# Patient Record
Sex: Female | Born: 1975
Health system: Southern US, Community
[De-identification: ages and names within clinical notes are randomized; demographics above are authoritative.]

## PROBLEM LIST (undated history)

## (undated) DIAGNOSIS — F419 Anxiety disorder, unspecified: Secondary | ICD-10-CM

## (undated) DIAGNOSIS — M545 Low back pain, unspecified: Secondary | ICD-10-CM

## (undated) DIAGNOSIS — G43909 Migraine, unspecified, not intractable, without status migrainosus: Secondary | ICD-10-CM

## (undated) DIAGNOSIS — O039 Complete or unspecified spontaneous abortion without complication: Secondary | ICD-10-CM

## (undated) DIAGNOSIS — R5383 Other fatigue: Secondary | ICD-10-CM

## (undated) HISTORY — DX: Low back pain: M54.5

## (undated) HISTORY — DX: Other fatigue: R53.83

## (undated) HISTORY — DX: Anxiety disorder, unspecified: F41.9

## (undated) HISTORY — DX: Migraine, unspecified, not intractable, without status migrainosus: G43.909

## (undated) HISTORY — DX: Low back pain, unspecified: M54.50

## (undated) HISTORY — DX: Complete or unspecified spontaneous abortion without complication: O03.9

---

## 2001-01-05 HISTORY — PX: BREAST CYST EXCISION: SHX579

## 2002-01-05 HISTORY — PX: COLONOSCOPY: SHX174

## 2009-04-17 ENCOUNTER — Ambulatory Visit: Payer: Self-pay

## 2011-07-15 ENCOUNTER — Ambulatory Visit: Payer: Self-pay

## 2012-07-15 ENCOUNTER — Ambulatory Visit: Payer: Self-pay

## 2012-11-02 ENCOUNTER — Ambulatory Visit: Payer: Self-pay

## 2013-03-15 DIAGNOSIS — R519 Headache, unspecified: Secondary | ICD-10-CM | POA: Insufficient documentation

## 2013-03-15 DIAGNOSIS — R51 Headache: Secondary | ICD-10-CM

## 2014-01-19 ENCOUNTER — Ambulatory Visit: Payer: Self-pay

## 2014-01-30 ENCOUNTER — Ambulatory Visit: Payer: Self-pay | Admitting: Family Medicine

## 2014-04-20 ENCOUNTER — Other Ambulatory Visit: Payer: Self-pay | Admitting: Certified Nurse Midwife

## 2014-04-20 DIAGNOSIS — N6459 Other signs and symptoms in breast: Secondary | ICD-10-CM

## 2014-07-23 ENCOUNTER — Other Ambulatory Visit: Payer: Self-pay

## 2014-07-23 ENCOUNTER — Ambulatory Visit: Payer: Self-pay | Attending: Certified Nurse Midwife

## 2014-08-01 ENCOUNTER — Ambulatory Visit
Admission: RE | Admit: 2014-08-01 | Discharge: 2014-08-01 | Disposition: A | Discharge status: Home / Self Care | Payer: 59 | Source: Ambulatory Visit | Attending: Family Medicine | Admitting: Family Medicine

## 2014-08-01 ENCOUNTER — Other Ambulatory Visit: Payer: Self-pay | Admitting: Family Medicine

## 2014-08-01 DIAGNOSIS — M79661 Pain in right lower leg: Secondary | ICD-10-CM | POA: Insufficient documentation

## 2014-10-05 ENCOUNTER — Ambulatory Visit
Admission: RE | Admit: 2014-10-05 | Discharge: 2014-10-05 | Disposition: A | Discharge status: Home / Self Care | Payer: 59 | Source: Ambulatory Visit | Attending: Certified Nurse Midwife | Admitting: Certified Nurse Midwife

## 2014-10-05 DIAGNOSIS — N6459 Other signs and symptoms in breast: Secondary | ICD-10-CM

## 2014-10-05 DIAGNOSIS — R921 Mammographic calcification found on diagnostic imaging of breast: Secondary | ICD-10-CM | POA: Diagnosis not present

## 2015-01-21 ENCOUNTER — Other Ambulatory Visit: Payer: Self-pay | Admitting: Obstetrics and Gynecology

## 2015-01-21 DIAGNOSIS — Z01419 Encounter for gynecological examination (general) (routine) without abnormal findings: Secondary | ICD-10-CM | POA: Diagnosis not present

## 2015-01-21 DIAGNOSIS — R921 Mammographic calcification found on diagnostic imaging of breast: Secondary | ICD-10-CM | POA: Diagnosis not present

## 2015-01-21 DIAGNOSIS — N898 Other specified noninflammatory disorders of vagina: Secondary | ICD-10-CM | POA: Diagnosis not present

## 2015-01-21 DIAGNOSIS — G43109 Migraine with aura, not intractable, without status migrainosus: Secondary | ICD-10-CM | POA: Diagnosis not present

## 2015-01-21 DIAGNOSIS — Z124 Encounter for screening for malignant neoplasm of cervix: Secondary | ICD-10-CM | POA: Diagnosis not present

## 2015-01-21 DIAGNOSIS — Z309 Encounter for contraceptive management, unspecified: Secondary | ICD-10-CM | POA: Diagnosis not present

## 2015-03-28 ENCOUNTER — Other Ambulatory Visit: Payer: 59

## 2015-03-28 ENCOUNTER — Ambulatory Visit: Payer: 59 | Attending: Obstetrics and Gynecology

## 2015-04-11 DIAGNOSIS — G43719 Chronic migraine without aura, intractable, without status migrainosus: Secondary | ICD-10-CM | POA: Diagnosis not present

## 2015-05-22 ENCOUNTER — Ambulatory Visit
Admission: RE | Admit: 2015-05-22 | Discharge: 2015-05-22 | Disposition: A | Discharge status: Home / Self Care | Payer: 59 | Source: Ambulatory Visit | Attending: Obstetrics and Gynecology | Admitting: Obstetrics and Gynecology

## 2015-05-22 DIAGNOSIS — R921 Mammographic calcification found on diagnostic imaging of breast: Secondary | ICD-10-CM

## 2015-06-26 DIAGNOSIS — N644 Mastodynia: Secondary | ICD-10-CM | POA: Diagnosis not present

## 2015-06-26 DIAGNOSIS — R920 Mammographic microcalcification found on diagnostic imaging of breast: Secondary | ICD-10-CM | POA: Diagnosis not present

## 2015-08-02 DIAGNOSIS — G43719 Chronic migraine without aura, intractable, without status migrainosus: Secondary | ICD-10-CM | POA: Diagnosis not present

## 2016-03-05 ENCOUNTER — Other Ambulatory Visit: Payer: Self-pay | Admitting: Obstetrics and Gynecology

## 2016-03-05 DIAGNOSIS — Z1231 Encounter for screening mammogram for malignant neoplasm of breast: Secondary | ICD-10-CM | POA: Diagnosis not present

## 2016-03-05 DIAGNOSIS — R921 Mammographic calcification found on diagnostic imaging of breast: Secondary | ICD-10-CM

## 2016-03-05 DIAGNOSIS — Z800 Family history of malignant neoplasm of digestive organs: Secondary | ICD-10-CM | POA: Diagnosis not present

## 2016-03-05 DIAGNOSIS — Z01419 Encounter for gynecological examination (general) (routine) without abnormal findings: Secondary | ICD-10-CM | POA: Diagnosis not present

## 2016-03-17 DIAGNOSIS — N888 Other specified noninflammatory disorders of cervix uteri: Secondary | ICD-10-CM | POA: Diagnosis not present

## 2016-03-17 DIAGNOSIS — N841 Polyp of cervix uteri: Secondary | ICD-10-CM | POA: Diagnosis not present

## 2016-03-17 DIAGNOSIS — N72 Inflammatory disease of cervix uteri: Secondary | ICD-10-CM | POA: Diagnosis not present

## 2016-04-14 DIAGNOSIS — N888 Other specified noninflammatory disorders of cervix uteri: Secondary | ICD-10-CM | POA: Diagnosis not present

## 2016-04-14 DIAGNOSIS — N72 Inflammatory disease of cervix uteri: Secondary | ICD-10-CM | POA: Diagnosis not present

## 2016-05-25 ENCOUNTER — Ambulatory Visit
Admission: RE | Admit: 2016-05-25 | Discharge: 2016-05-25 | Disposition: A | Discharge status: Home / Self Care | Payer: 59 | Source: Ambulatory Visit | Attending: Obstetrics and Gynecology | Admitting: Obstetrics and Gynecology

## 2016-05-25 DIAGNOSIS — R921 Mammographic calcification found on diagnostic imaging of breast: Secondary | ICD-10-CM | POA: Insufficient documentation

## 2016-05-25 DIAGNOSIS — R922 Inconclusive mammogram: Secondary | ICD-10-CM | POA: Diagnosis not present

## 2016-07-01 ENCOUNTER — Ambulatory Visit: Payer: Self-pay | Admitting: Physician Assistant

## 2016-07-01 VITALS — BP 100/80 | HR 82 | Temp 98.6°F

## 2016-07-01 DIAGNOSIS — M79672 Pain in left foot: Secondary | ICD-10-CM

## 2016-07-01 MED ORDER — GABAPENTIN 300 MG PO CAPS: 300.0000 mg | ORAL_CAPSULE | Freq: Every day | ORAL | 3 refills | Status: DC

## 2016-07-01 MED ORDER — METHYLPREDNISOLONE 4 MG PO TBPK
ORAL_TABLET | ORAL | 0 refills | Status: DC
Start: 2016-07-01 — End: 2016-08-24

## 2016-07-01 NOTE — Progress Notes (Signed)
S: c/o pain b/n 3rd and 4th toes, hurts more on the bottom of her foot, pain will radiate up her leg to her knee, no known injury, pain for about a month, no redness or swelling   O: vitals wnl, nad, skin intact, no bruising or redness noted on foot, are under 3rd and 4th toe is a little tender, area at back of left knee a little tender, full rom of both, n/v intact  A: acute foot pain, ?morton's neuroma  P: medrol dose pack, gabapentin, refer to podiatry

## 2016-07-01 NOTE — Progress Notes (Signed)
Spoke with Deanna @ [facility] next available appointment due to vacations will be on July 19,2018 with Dr. Jens Som @ 9:45. LM on patient voicemail of appt. day and time. Also stated that she can call their office prior to her appointment to see if there is any cancellations.

## 2016-08-24 ENCOUNTER — Ambulatory Visit (INDEPENDENT_AMBULATORY_CARE_PROVIDER_SITE_OTHER): Payer: 59

## 2016-08-24 ENCOUNTER — Ambulatory Visit: Payer: Self-pay

## 2016-08-24 ENCOUNTER — Ambulatory Visit (INDEPENDENT_AMBULATORY_CARE_PROVIDER_SITE_OTHER): Payer: 59 | Admitting: Podiatry

## 2016-08-24 DIAGNOSIS — G5762 Lesion of plantar nerve, left lower limb: Secondary | ICD-10-CM | POA: Diagnosis not present

## 2016-08-24 DIAGNOSIS — M79672 Pain in left foot: Secondary | ICD-10-CM

## 2016-08-24 DIAGNOSIS — D3613 Benign neoplasm of peripheral nerves and autonomic nervous system of lower limb, including hip: Secondary | ICD-10-CM

## 2016-08-24 DIAGNOSIS — F419 Anxiety disorder, unspecified: Secondary | ICD-10-CM | POA: Insufficient documentation

## 2016-08-24 DIAGNOSIS — M205X9 Other deformities of toe(s) (acquired), unspecified foot: Secondary | ICD-10-CM

## 2016-08-24 DIAGNOSIS — N6001 Solitary cyst of right breast: Secondary | ICD-10-CM | POA: Insufficient documentation

## 2016-08-24 MED ORDER — MELOXICAM 15 MG PO TABS: 15.0000 mg | ORAL_TABLET | Freq: Every day | ORAL | 3 refills | Status: DC

## 2016-08-24 NOTE — Progress Notes (Addendum)
   Subjective:    Patient ID: Monica Kaiser, female    DOB: 03-Jun-1975, 41 y.o.   MRN: [id number]  HPI this patient presents the office with chief complaint of sharp, radiating pain noted through the fourth and third toes of her left foot.  She says she experiences radiating pain noted through her foot up the back of her leg.  She says that she walks continuously at work, which includes 12 hour shift.  She says she walks and after 1 hour she started experiencing pain and discomfort in her left foot extending up to her left leg  . She says that this pain is becoming  more frequent and the pain has greater intensity.  She says she has attempted to use ibuprofen, which has provided no relief.  She points to her third and fourth toes as the painful areas and states that she feels pain on the bottom of these 2 toes.  She denies any history of trauma or reinjury to the foot.  She presents the office today for an evaluation and treatment of her painful left foot    Review of Systems     Objective:   Physical Exam GENERAL APPEARANCE: Alert, conversant. Appropriately groomed. No acute distress.  VASCULAR: Pedal pulses are  palpable at  [facility] and PT bilateral.  Capillary refill time is immediate to all digits,  Normal temperature gradient.  Digital hair growth is present bilateral  NEUROLOGIC: sensation is normal to 5.07 monofilament at 5/5 sites bilateral.  Light touch is intact bilateral, Muscle strength normal.  MUSCULOSKELETAL: acceptable muscle strength, tone and stability bilateral.  Functional hallux limitus first MPJ bilateral.  Dorsal exostosis noted at the head of the first metatarsal bilaterally.  Palpable pain noted in the third interspace of the left foot.  Absent Mulder sign.  Exostosis noted at the level of the first metatarsal cuneiform joint bilaterally.  This patient has significant hypermobility in her feet, especially her forefoot bilaterally.  DERMATOLOGIC: skin color, texture, and turgor  are within normal limits.  No preulcerative lesions or ulcers  are seen, no interdigital maceration noted.  No open lesions present.  Digital nails are asymptomatic. No drainage noted.         Assessment & Plan:  Functional hallux limitus first MPJ bilaterally.  Neuroma third interspace left foot .  Hypermobility bilaterally.  Initial exam.  X-rays were taken which reveal no evidence of any bony pathology.  There is dorsal lipping noted at the head of the first metatarsal left foot.  Discussed this condition with this patient.  Explained to her that her hypermobility has led to her  functional hallux limitus.  Piling on metatarsals, left foot, which leads to a neuroma of the third interspace.  Told her that  she should wear her orthotics to limit her hypermobility.  She says she will purchase orthotics on her own.  Prescribed Mobic to be taken when necessary for pain.  Return to the clinic in 2 weeks at which time she could be measured for orthoses with a kinetic wedge.   Gardiner Barefoot DPM

## 2016-08-27 ENCOUNTER — Telehealth: Payer: Self-pay | Admitting: Podiatry

## 2016-08-27 NOTE — Telephone Encounter (Signed)
Yes that will be fine.  She might be better off with orthotics. Make sure to put the charge in.

## 2016-08-27 NOTE — Telephone Encounter (Signed)
Patient came in and picked up a cam boot to try.

## 2016-08-27 NOTE — Telephone Encounter (Signed)
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 She wanted to know if Dr. Milinda Pointer could give her a cam boot to wear for support. Please advise.

## 2016-09-17 ENCOUNTER — Ambulatory Visit (INDEPENDENT_AMBULATORY_CARE_PROVIDER_SITE_OTHER): Payer: 59 | Admitting: Podiatry

## 2016-09-17 ENCOUNTER — Encounter: Payer: Self-pay | Admitting: Podiatry

## 2016-09-17 DIAGNOSIS — M79672 Pain in left foot: Secondary | ICD-10-CM

## 2016-09-17 DIAGNOSIS — M205X9 Other deformities of toe(s) (acquired), unspecified foot: Secondary | ICD-10-CM | POA: Diagnosis not present

## 2016-09-17 DIAGNOSIS — D3613 Benign neoplasm of peripheral nerves and autonomic nervous system of lower limb, including hip: Secondary | ICD-10-CM | POA: Diagnosis not present

## 2016-09-17 NOTE — Progress Notes (Signed)
This patient returns to the office stating that she still having pain and discomfort in her left foot  . She was seen approximately 3 weeks ago and diagnosed with a functional hallux limitus first MPJ and neuroma third interspace left foot.  She now says the pain in her left midfoot is worse and seems to also be in her other area of her left foot. She has worn her power step insoles as well as prescribed Mobic to take for pain. She says that the pain worsens by the end of the day. Since she works 12 hour shifts.  She says her pain is not severe on her off days but is severe after days working the 12 hour shift.  She says the pain at that time measures 8-9 on a scale of 10.  She has also worn a Cam Walker in an effort to control her pain when she works.  She presents the office stating that the pain continues and she states the insoles have not been effective in her mind.  She presents the office today for continued evaluation and treatment of her painful left foot   Podiatric Exam: Vascular: dorsalis pedis and posterior tibial pulses are palpable bilateral. Capillary return is immediate. Temperature gradient is WNL. Skin turgor WNL  Sensorium: Normal Semmes Weinstein monofilament test. Normal tactile sensation bilaterally. Nail Exam: Pt has thick disfigured discolored nails with subungual debris noted bilateral entire nail hallux through fifth toenails Ulcer Exam: There is no evidence of ulcer or pre-ulcerative changes or infection. Orthopedic Exam: Muscle tone and strength are WNL. No limitations in general ROM. No crepitus or effusions noted. Functional hallux limitus noticed first MPJ bilaterally. Dorsal exostosis noted at the head of the first metatarsal bilaterally..  Palpable pain noted in the second and third interspaces of the left foot.  Exostosis noted at the first [facility] J bilaterally.  Significant hypermobility noted Skin: No Porokeratosis. No infection or ulcer.  Functional hallux limitus first  MPJ bilateral  neuroma, second, third interspace left foot  . Forefoot hypermobility bilateral  Return office visit.  Injection therapy of the left forefoot.  . Told her to continue to use the power step insoles.  Patient was told to take the Mobic when necessary.  RTC 3 weeks.  I still need to consider orthotic therapy and obtain a kinetic wedge for this patient in the future    Gardiner Barefoot DPM

## 2016-09-23 ENCOUNTER — Encounter: Payer: Self-pay | Admitting: Physician Assistant

## 2016-09-23 ENCOUNTER — Ambulatory Visit: Payer: Self-pay | Admitting: Physician Assistant

## 2016-09-23 VITALS — BP 100/60 | HR 71 | Temp 98.4°F

## 2016-09-23 DIAGNOSIS — J029 Acute pharyngitis, unspecified: Secondary | ICD-10-CM

## 2016-09-23 LAB — POCT RAPID STREP A (OFFICE): Rapid Strep A Screen: NEGATIVE

## 2016-09-23 MED ORDER — BENZONATATE 200 MG PO CAPS: 200.0000 mg | ORAL_CAPSULE | Freq: Two times a day (BID) | ORAL | 0 refills | Status: DC | PRN

## 2016-09-23 MED ORDER — AZITHROMYCIN 250 MG PO TABS: ORAL_TABLET | ORAL | 0 refills | Status: DC

## 2016-09-23 NOTE — Progress Notes (Signed)
S: C/o sore throat, cough, and  congestion for 2 days, no fever, chills, cp/sob, v/d; mucus was green this am but clear throughout the day, cough is sporadic, coworker had ?strep  Using otc meds:   O: PE: vitals wnl, nad, perrl eomi, normocephalic, tms dull, nasal mucosa red and swollen, throat injected, neck supple no lymph, lungs c t a, cv rrr, neuro intact, q strep neg  A:  Acute uri   P: drink fluids, continue regular meds , use otc meds of choice, return if not improving in 5 days, return earlier if worsening  Zpack, tessalon perls

## 2016-11-05 ENCOUNTER — Ambulatory Visit: Payer: 59 | Admitting: Podiatry

## 2016-11-05 ENCOUNTER — Encounter: Payer: Self-pay | Admitting: Podiatry

## 2016-11-05 ENCOUNTER — Ambulatory Visit (INDEPENDENT_AMBULATORY_CARE_PROVIDER_SITE_OTHER): Payer: 59 | Admitting: Podiatry

## 2016-11-05 VITALS — BP 96/63 | HR 67

## 2016-11-05 DIAGNOSIS — D3613 Benign neoplasm of peripheral nerves and autonomic nervous system of lower limb, including hip: Secondary | ICD-10-CM | POA: Diagnosis not present

## 2016-11-05 DIAGNOSIS — M205X9 Other deformities of toe(s) (acquired), unspecified foot: Secondary | ICD-10-CM | POA: Diagnosis not present

## 2016-11-05 NOTE — Progress Notes (Signed)
This patient presents the office with continued pain noted in her left foot.  She says that the pain was severe yesterday and was radiating from her toes up into her leg into her toe. 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 She says the pain is severe at work that sometimes she wears the cam walker that was previously dispensed.  She was treated with injection therapy for the neuromas as well as Mobic and she says they have been minimally effective.  She has been previously diagnosed with a functional hallux limitus with neuromas in her left forefoot.  She returns the office today for continued evaluation and treatment of this painful left foot.     General Appearance  Alert, conversant and in no acute stress.  Vascular  Dorsalis pedis and posterior pulses are palpable  bilaterally.  Capillary return is within normal limits  Bilaterally. Temperature is within normal limits  Bilaterally  Neurologic  Senn-Weinstein monofilament wire test within normal limits  bilaterally. Muscle power  Within normal limits bilaterally.  Nails normal nails noted with no evidence of any bacterial or fungal infection  Orthopedic  No limitations of motion of motion feet bilaterally.  No crepitus or effusions noted.  Functional hallux limitus noticed first MPJ bilateral dorsal exostosis at the head of the first metatarsal bilaterally.  Localized pain in the third interspace of the left foot.  Excessive forefoot hypermobility.  Skin  normotropic skin with no porokeratosis noted bilaterally.  No signs of infections or ulcers noted.     Functional hallux limitus first MPJ bilaterally.  Neuroma third interspace left foot.     ROV.  Talked to this patient concerning her foot.  We have tried a Cam Walker, injection therapy as well as anti-inflammatory medicine and she continues to have pain.  She has been against obtaining orthotics, but I told her that I believe this would help her and she decided to make an  appointment with Liliane Channel  to be casted and measured for orthotics with a kinetic wedge.  She is return to the office on Wednesday for that appointment with Liliane Channel.  In the meantime, continue to wear the power step insoles and take the Mobic as needed.   Gardiner Barefoot DPM

## 2016-11-11 ENCOUNTER — Ambulatory Visit (INDEPENDENT_AMBULATORY_CARE_PROVIDER_SITE_OTHER): Payer: 59 | Admitting: Orthotics

## 2016-11-11 DIAGNOSIS — M205X9 Other deformities of toe(s) (acquired), unspecified foot: Secondary | ICD-10-CM | POA: Diagnosis not present

## 2016-11-11 DIAGNOSIS — D3613 Benign neoplasm of peripheral nerves and autonomic nervous system of lower limb, including hip: Secondary | ICD-10-CM

## 2016-11-11 NOTE — Progress Notes (Signed)
Patient came in today for evaluation/assessment custom foot orthotics.  Patient presents foot pain and discomfort associated with mortons neuroma LEFT  . Marland KitchenMarland KitchenMarland KitchenGoal is to "bring ground up" with contoured arch support, and neurma pad proximal 3/4 interspace L.

## 2016-12-08 ENCOUNTER — Ambulatory Visit: Payer: 59 | Admitting: Orthotics

## 2016-12-08 DIAGNOSIS — M205X9 Other deformities of toe(s) (acquired), unspecified foot: Secondary | ICD-10-CM

## 2016-12-08 NOTE — Progress Notes (Signed)
Patient came in today to pick up custom made foot orthotics.  The goals were accomplished and the patient reported no dissatisfaction with said orthotics.  Patient was advised of breakin period and how to report any issues. 

## 2016-12-30 ENCOUNTER — Ambulatory Visit: Payer: 59 | Admitting: Orthotics

## 2016-12-30 DIAGNOSIS — D3613 Benign neoplasm of peripheral nerves and autonomic nervous system of lower limb, including hip: Secondary | ICD-10-CM

## 2016-12-30 NOTE — Progress Notes (Signed)
Patient came in today for adjusment CMFO.  She complaint is that she tried the neuroma pad in various places and still didn't get the relief she was expecting.  I will modifiy fhe f/o to add met bar left and offload 3rd MPJ.

## 2016-12-31 ENCOUNTER — Ambulatory Visit: Payer: 59 | Attending: Family Medicine | Admitting: Physical Therapy

## 2016-12-31 DIAGNOSIS — M79672 Pain in left foot: Secondary | ICD-10-CM | POA: Insufficient documentation

## 2016-12-31 NOTE — Therapy (Signed)
[place] [facility] [address] [place], [place], [address] Phone: [phone]   Fax:  [phone]  Patient Details  Name: Monica Kaiser MRN: [id number] Date of Birth: 16-Jun-1975 Referring Provider:  [place] Pink, MD, PCP Prudence Davidson, MD- Podiatrist Encounter Date: 12/31/2016  PT/OT/SLP Screening Form   Time: in_903_____     Time out__928___   Complaint ___left foot pain________________ Past Medical Hx:  __none significant______________ Injury Date:__a few months______________________________  Pain Scale: __Current: 4/10, Worst: 8/10, best: 2/10__________ Patient's phone number: [phone]  Hx (this occurrence):  Went to see MD regarding left foot pain; Patient reports that she was diagnosed with nerve pain along 3rd/4th metatarsal which radiates to back of leg; Patient was diagnosed with morton's neuroma; She was given custom orthotics. She was also given a CAM boot; She reports that after about an hour she will be limping and having trouble with weight bearing; she reports that her orthotics are painful and when she went to MD yesterday they kept her orthotics and were going to pad it more.   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; She reports that rest will help relieve pain; She reports trying ice/heat in the past with no significant relief;     Assessment: Ankle ROM: LLE: DF: 5 degrees, PF 80 degrees, IV: 55 degrees, EV: 40 degrees with no significant pain in either direction  RLE: DF: 8 degrees, PF 80 degrees, IV: 55 degrees, EV: 30 degrees with no pain;   Patient exhibits increased tenderness and pain along 3rd and 4th toe plantar surface superior to metatarsal head; Has some pain with AP/PA mobs along metatarsals;   Recommendations:   Pt would benefit from skilled PT intervention to improve strength in left foot/ankle as she exhibits significant laxity and weakness; Would  also benefit from trial of Iontophoresis for pain management related to inflammation;  Comments:    [x]  Patient would benefit from an MD referral []  Patient would benefit from a full PT/OT/ SLP evaluation and treatment. []  No intervention recommended at this time.   Pari Lombard  PT, DPT 12/31/2016, 9:06 AM  [place] [facility] [address] [place], [place], [address] Phone: [phone]   Fax:  [phone]

## 2016-12-31 NOTE — Patient Instructions (Addendum)
Dorsiflexion: Resisted    Facing anchor, tubing around left foot, pull toward face.  Repeat ___15_ times per set. Do _2___ sets per session. Do _2___ sessions per day.  http://orth.exer.us/9   Copyright  VHI. All rights reserved.  Eversion: Resisted    With left foot in tubing loop, hold tubing around other foot to resist and turn foot out. Repeat _15___ times per set. Do ___2_ sets per session. Do _2___ sessions per day.  http://orth.exer.us/15   Copyright  VHI. All rights reserved.

## 2017-01-06 ENCOUNTER — Ambulatory Visit: Payer: 59 | Admitting: Orthotics

## 2017-01-06 DIAGNOSIS — M205X9 Other deformities of toe(s) (acquired), unspecified foot: Secondary | ICD-10-CM

## 2017-01-06 DIAGNOSIS — D3613 Benign neoplasm of peripheral nerves and autonomic nervous system of lower limb, including hip: Secondary | ICD-10-CM

## 2017-01-06 DIAGNOSIS — N898 Other specified noninflammatory disorders of vagina: Secondary | ICD-10-CM | POA: Diagnosis not present

## 2017-01-06 DIAGNOSIS — N760 Acute vaginitis: Secondary | ICD-10-CM | POA: Diagnosis not present

## 2017-01-06 DIAGNOSIS — B9689 Other specified bacterial agents as the cause of diseases classified elsewhere: Secondary | ICD-10-CM | POA: Diagnosis not present

## 2017-01-06 NOTE — Progress Notes (Signed)
Gave patient met bar and small met pad to try to fix in f/o.Marland Kitchenshe will advise how they feel and make f/u appointment to glue top cover down.

## 2017-01-13 ENCOUNTER — Encounter: Payer: Self-pay | Admitting: Physical Therapy

## 2017-01-13 ENCOUNTER — Ambulatory Visit: Payer: 59 | Attending: Family Medicine | Admitting: Physical Therapy

## 2017-01-13 DIAGNOSIS — M79672 Pain in left foot: Secondary | ICD-10-CM | POA: Diagnosis not present

## 2017-01-13 DIAGNOSIS — R262 Difficulty in walking, not elsewhere classified: Secondary | ICD-10-CM | POA: Insufficient documentation

## 2017-01-13 DIAGNOSIS — M6281 Muscle weakness (generalized): Secondary | ICD-10-CM | POA: Diagnosis not present

## 2017-01-13 NOTE — Patient Instructions (Addendum)
Calf Stretch    With towel around forefoot, keep knee straight and pull back on towel until a stretch is felt in the calf. While feeling stretch, pull toes back and then relax to facilitate nerve stretch;  Hold _1___ seconds. Repeat _10___ times. Do __4__ sessions per day.  Copyright  VHI. All rights reserved.   Then with towel under calf, around bottom of foot, pull foot towards you while also pulling toes until stretch is felt in calf. HOld for 20 sec; Repeat 3-4 times  A day;    Ice bottom of foot for 10 min at a time at least 3-4 times a day- be sure to wear a sock to avoid ice directly on skin;

## 2017-01-13 NOTE — Therapy (Signed)
[place] MAIN [facility] SERVICES [address] [place], [place], [address] Phone: [phone]   Fax:  [phone]  Physical Therapy Evaluation  Patient Details  Name: Monica Kaiser MRN: [id number] Date of Birth: [birth year] Referring Provider: Kary Kos, MD (PCP)   Encounter Date: 01/13/2017  PT End of Session - 01/13/17 1522    Visit Number  1    Number of Visits  13    Date for PT Re-Evaluation  02/24/17    Authorization - Visit Number  1    Authorization - Number of Visits  60    PT Start Time  1100    PT Stop Time  7893    PT Time Calculation (min)  55 min    Activity Tolerance  Patient tolerated treatment well;No increased pain    Behavior During Therapy  [facility] for tasks assessed/performed       History reviewed. No pertinent past medical history.  Past Surgical History:  Procedure Laterality Date  . BREAST BIOPSY Right 2003   removed cyst    There were no vitals filed for this visit.   Subjective Assessment - 01/13/17 1103    Subjective  "My left foot hurts really bad. especially after I have been standing for > 1 hour"     Pertinent History  Patient reports increased left foot pain for a few months; Went to see MD regarding left foot pain; Patient reports that she was diagnosed with nerve pain along 3rd/4th metatarsal which radiates to back of leg; Patient was diagnosed with morton's neuroma; She was given custom orthotics. She was also given a CAM boot; She reports that after about an hour she will be limping and having trouble with weight bearing; she reports that her orthotics are painful and when she went to MD yesterday they kept her orthotics and were going to pad it more. She got the pad back but states that they are worse; She reports that they are uncomfortable due to cusions under met head. "It feels like I have a rolled up sock" Patient also reports increased numbness in toes/feet with prolonged sitting (riding in the car); she  will notice it other times when at rest; She reports that the numbness goes away when she starts walking on it. patient reports that she will walk at home without shoes on.     Limitations  Standing;Walking    How long can you sit comfortably?  does relieve pain some; but will have some numbness with prolonged sitting ;    How long can you stand comfortably?  >30 min    How long can you walk comfortably?  pain with 0.25 miles    Diagnostic tests  Xrays in August 2018- diagnosed with neuroma in left foot; and some joint issues along 1st toe;     Patient Stated Goals  reduce pain;     Currently in Pain?  Yes    Pain Score  5     Pain Location  Foot    Pain Orientation  Left    Pain Descriptors / Indicators  Aching;Throbbing    Pain Type  Chronic pain    Pain Radiating Towards  radiating up to calf;     Pain Onset  More than a month ago    Pain Frequency  Constant    Aggravating Factors   worse with standing/walking;     Pain Relieving Factors  rest, taking shoes off;     Effect of Pain  on Daily Activities  decreased work tolerance- has to stand most of the day at work;     Multiple Pain Sites  No         [facility] PT Assessment - 01/13/17 0001      Assessment   Medical Diagnosis  left foot pain    Referring Provider  Kary Kos, MD (PCP)    Onset Date/Surgical Date  08/05/16    Hand Dominance  Right    Next MD Visit  none scheduled    Prior Therapy  denies any therapy for this condition;       Precautions   Precautions  None    Required Braces or Orthoses  -- has orthotics for left foot and CAM boot PRN      Restrictions   Weight Bearing Restrictions  No      Balance Screen   Has the patient fallen in the past 6 months  No    Has the patient had a decrease in activity level because of a fear of falling?   No    Is the patient reluctant to leave their home because of a fear of falling?   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, sat/sun; will have increased pain by Mon due to increased work demands;     Leisure  wanting to exercise but can't due to foot pain; shopping; will limit if walking is involved;       Cognition   Overall Cognitive Status  Within Functional Limits for tasks assessed      Observation/Other Assessments   Observations  nice young woman, pleasant;     Skin Integrity  intact     Lower Extremity Functional Scale   31/80 (the lower the score the greater the disability)      Sensation   Light Touch  Appears Intact does have intermittent numbness    Proprioception  Appears Intact      Coordination   Gross Motor Movements are Fluid and Coordinated  Yes    Fine Motor Movements are Fluid and Coordinated  Yes      Posture/Postural Control   Posture Comments  demonstrates erect posture, no abnormality;       AROM   Overall AROM Comments  BLE are WFL with exception of ankle- hypermobile:     Right Ankle Dorsiflexion  8    Right Ankle Plantar Flexion  80    Right Ankle Inversion  55    Right Ankle Eversion  30    Left Ankle Dorsiflexion  5    Left Ankle Plantar Flexion  80    Left Ankle Inversion  55    Left Ankle Eversion  40      Strength   Overall Strength Comments  BLE grossly 5/5 with exception of ankle:     Right Ankle Dorsiflexion  5/5    Right Ankle Plantar Flexion  4-/5    Right Ankle Inversion  5/5    Right Ankle Eversion  5/5    Left Ankle Dorsiflexion  4/5    Left Ankle Plantar Flexion  4-/5  Left Ankle Inversion  4/5    Left Ankle Eversion  4-/5      Palpation   Palpation comment  moderate tenderness along plantar surface of 2-3 toes      Transfers   Comments  able to transfer  independently without any difficulty;       Ambulation/Gait   Gait Comments  ambulates with normal reciprocal gait pattern; will exhibit antalgic gait pattern on left side with prolonged standing/walking;       High Level Balance   High Level Balance Comments  able to hold SLS on each leg but reports pain in LLE during SLS; demonstrates normal standing balance;              Objective measurements completed on examination: See above findings.    TREATMENT: PT instructed patient in LLE foot stretches including: Calf stretch with towel over toes for toe extension for better foot stretch 20 sec hold x2 reps; LLE calf stretch with towel around foot with toes flexion/extension AROM to facilitate neural stretch x10 reps;  Provided written handout for better adherence with written education on ice;   PT applied Iontophoresis to left foot with 92m/mL dexamethasone along plantar surface of left foot distal 2-3rd digit, superior to met head; Educated patient on safe wear time and to remove in burning, itching or turning red as this is a sign of allergic reaction;           PT Education - 01/13/17 1520    Education provided  Yes    Education Details  HEP initiated, safe IONTO wear; recommendations;     Person(s) Educated  Patient    Methods  Explanation;Handout;Demonstration    Comprehension  Verbalized understanding;Returned demonstration;Verbal cues required;Need further instruction       PT Short Term Goals - 01/13/17 1535      PT SHORT TERM GOAL #1   Title  Patient will be adherent to wearing orthotic while working 80% of time to improve foot placement and support to reduce pain while at work;     Time  4    Period  Weeks    Status  New    Target Date  02/10/17      PT SHORT TERM GOAL #2   Title  Patient will be adherent to HEP at least 2x a day to improve neural flexibility to help reduce pain with ADLs;     Time  4    Period  Weeks    Status  New    Target Date   02/10/17        PT Long Term Goals - 01/13/17 1540      PT LONG TERM GOAL #1   Title  Patient will be independent in home exercise program to improve strength/mobility for better functional independence with ADLs.    Time  6    Period  Weeks    Status  New    Target Date  02/24/17      PT LONG TERM GOAL #2   Title  Patient will report a worst pain of 3/10 on VAS in  left foot over last 2 days   to improve tolerance with ADLs and reduced symptoms with activities.     Time  6    Period  Weeks    Status  New    Target Date  02/24/17      PT LONG TERM GOAL #3   Title  Patient will increase LLE ankle gross strength  to 4+/5 as to improve functional strength for independent gait, increased standing tolerance and increased ADL ability.    Time  6    Period  Weeks    Status  New    Target Date  02/24/17      PT LONG TERM GOAL #4   Title  Patient will increase lower extremity functional scale to >60/80 to demonstrate improved functional mobility and increased tolerance with ADLs.     Time  6    Period  Weeks    Status  New    Target Date  02/24/17             Plan - 01/13/17 1523    Clinical Impression Statement  42 yo Female presetns to therapy with increased left foot pain. She has tenderness just under 2nd and 3rd digit superior to met heads on plantar surface of foot. She reports that her pain is worse with prolonged standing and will actually radiate up back of leg to calf. She is limited with prolonged standing which had made work difficult. Patient has seen a podiatrist who evaluated her and diagnosed her with mortons neuroma; patient has been given special orthotics but does not like to wear them due to increased pain. She exhibits increased ROM in left ankle and weakess in ankle which limits stability. She would benefit from skilled PT intervention to improve neural flexibility and improve ankle stability for less pain with ADLs;     History and Personal Factors relevant  to plan of care:  chronic pain, mixed compliance with proper footwear/orthotics, demanding work schedule; minimal co-morbidities;     Clinical Presentation  Evolving    Clinical Presentation due to:  pain starts in plantar surface of foot and radiates up to calf as it gets worse;     Clinical Decision Making  Moderate    Rehab Potential  Fair    Clinical Impairments Affecting Rehab Potential  positive: motivated; negative: chronic condition that may not improve with conservative treatment;     PT Frequency  2x / week    PT Duration  6 weeks    PT Treatment/Interventions  ADLs/Self Care Home Management;Cryotherapy;Iontophoresis 58m/ml Dexamethasone;Moist Heat;Ultrasound;Gait training;Stair training;Functional mobility training;Therapeutic activities;Therapeutic exercise;Manual techniques;Patient/family education;Passive range of motion;Dry needling;Orthotic Fit/Training;Energy conservation;Taping    PT Next Visit Plan  work on manual therapy/nerve glides; assess ionto    PT Home Exercise Plan  initiated- see patient instructions;     Consulted and Agree with Plan of Care  Patient       Patient will benefit from skilled therapeutic intervention in order to improve the following deficits and impairments:  Decreased activity tolerance, Decreased knowledge of use of DME, Decreased strength, Increased fascial restricitons, Pain, Decreased mobility, Difficulty walking, Hypermobility  Visit Diagnosis: Pain in left foot  Muscle weakness (generalized)  Difficulty in walking, not elsewhere classified     Problem List Patient Active Problem List   Diagnosis Date Noted  . Anxiety 08/24/2016  . Benign cyst of right breast 08/24/2016  . Headache 03/15/2013    Trotter,Margaret PT, DPT 01/13/2017, 3:42 PM  [facility] [address] [address]Phone: [phone]  Fax:  [phone] Name: Monica GLAUSERMRN:  [id number] of Birth: 809/30/1977

## 2017-01-18 ENCOUNTER — Ambulatory Visit: Payer: 59

## 2017-01-20 ENCOUNTER — Ambulatory Visit: Payer: 59 | Admitting: Family Medicine

## 2017-01-20 ENCOUNTER — Ambulatory Visit: Payer: 59 | Admitting: Physical Therapy

## 2017-01-20 DIAGNOSIS — G5762 Lesion of plantar nerve, left lower limb: Secondary | ICD-10-CM | POA: Diagnosis not present

## 2017-01-20 NOTE — Progress Notes (Signed)
Chief complaint: Left plantar foot pain x 5 months  History of present illness: Monica Kaiser is a 42 year old female who presents to the sports medicine office today with chief complaint of left foot pain. She reports that pain is on the plantar aspect of her left foot over between the third and fourth metatarsal heads. She is not report of any specific inciting incident, trauma, or injury to explain the pain. She reports that she describes the pain as sharp, with associated numbness and tingling. 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 She initially saw podiatry, who tried 1 or 2 cortisone injections. She reports no interval improvement with this. She reports that she was prescribed meloxicam, with no interval improvement. Custom orthotics were made for her about 2 weeks ago. A metatarsal pad was placed on the left side, but she reports that the pad has been uncomfortable for her. She feels as if her foot is balling up on her. No interval injury or trauma. Today, she reports that symptoms are the same, no better or worse since August. She rates the pain as a 4/10. Rest is only alleviating factor. No swelling, warmth, erythema, or ecchymosis.  Review of systems:  As stated above  Her past medical history, surgical history, family history, and social history obtained and reviewed. Medical history noted for anxiety, had right breast biopsy back in 2003, no current tobacco use, family history notable for lung cancer and colon cancer, no known drug allergies  Physical exam: Vital signs are reviewed and are documented in the chart Gen.: Alert, oriented, appears stated age, in no apparent distress HEENT: Moist oral mucosa Respiratory: Normal respirations, able to speak in full sentences Cardiac: Regular rate, distal pulses 2+ Integumentary: No rashes  on visible skin  Neurologic: Strength and sensation is intact in bilateral feet and ankles Psych: Normal affect, mood is described as good Musculoskeletal: Inspection of left foot and ankle reveal no obvious deformity or muscle atrophy, no warmth, erythema, ecchymosis, or effusion, see specifically point tender over the plantar aspect of the left foot between third and fourth metatarsal heads, no tenderness over the great toe on both sides, normal great toe flexion and extension, on gait analysis she does land more forefoot varus bilaterally, starting to get some hammering of toes 4 and 5 bilaterally  Assessment and plan: 1. Left foot Morton's neuroma  Plan: Do feel that her current symptoms are related to Morton's neuroma. With her current custom orthotics, did remove metatarsal pad and replaced it with a neuroma pad. This was fitted to her satisfaction. This did alleviate some of her symptoms. Also gave additional neuroma pad to her other pair of orthotics. Discussed topical medication such as capsaicin for pain. Will have her follow-up in 4 weeks for interval evaluation or sooner as needed.   Mort Sawyers, M.D. [place]

## 2017-01-25 ENCOUNTER — Ambulatory Visit: Payer: 59

## 2017-01-27 ENCOUNTER — Ambulatory Visit: Payer: 59

## 2017-02-01 ENCOUNTER — Ambulatory Visit: Payer: 59

## 2017-02-03 ENCOUNTER — Ambulatory Visit: Payer: 59

## 2017-02-04 ENCOUNTER — Encounter: Payer: Self-pay | Admitting: Physical Therapy

## 2017-02-04 DIAGNOSIS — R262 Difficulty in walking, not elsewhere classified: Secondary | ICD-10-CM

## 2017-02-04 DIAGNOSIS — M6281 Muscle weakness (generalized): Secondary | ICD-10-CM

## 2017-02-04 DIAGNOSIS — M79672 Pain in left foot: Secondary | ICD-10-CM

## 2017-02-04 NOTE — Therapy (Signed)
[place] MAIN [facility] SERVICES [address] [place], [place], [address] Phone: [phone]   Fax:  [phone]  February 04, 2017   @CCLISTADDRESS @  Physical Therapy Discharge Summary  Patient: Monica Kaiser  MRN: [id number]  Date of Birth: 1975/03/15   Diagnosis: Pain in left foot  Muscle weakness (generalized)  Difficulty in walking, not elsewhere classified Referring Provider: Kary Kos, MD (PCP)   The above patient had been seen in Physical Therapy 1 times of 4 treatments scheduled with 0 no shows and 1 cancellations.  The treatment consisted of HEP initiation with ankle strengthening and Iontophoresis The patient is: Unchanged  Subjective: Patient called and cancelled all her appointments stating, "I know that I won't be compliant with my exercises so there is no point in me continuing with therapy. I will just have to follow up with the doctor."   Discharge Findings: see eval  Functional Status at Discharge: no change  No Goals Met    Sincerely,   Kush Farabee, PT DPT   CC @[email or web address] @  [place], [place], [address] Phone: [phone]   Fax:  [phone]  Patient: Monica Kaiser  MRN: [id number]  Date of Birth: Jul 20, 1975

## 2017-02-08 ENCOUNTER — Ambulatory Visit: Payer: 59

## 2017-02-10 ENCOUNTER — Ambulatory Visit: Payer: 59

## 2017-02-17 ENCOUNTER — Ambulatory Visit: Payer: Self-pay | Admitting: Family Medicine

## 2017-03-15 DIAGNOSIS — Z1151 Encounter for screening for human papillomavirus (HPV): Secondary | ICD-10-CM | POA: Diagnosis not present

## 2017-03-15 DIAGNOSIS — Z01419 Encounter for gynecological examination (general) (routine) without abnormal findings: Secondary | ICD-10-CM | POA: Diagnosis not present

## 2017-03-15 DIAGNOSIS — N760 Acute vaginitis: Secondary | ICD-10-CM | POA: Diagnosis not present

## 2017-03-15 DIAGNOSIS — Z124 Encounter for screening for malignant neoplasm of cervix: Secondary | ICD-10-CM | POA: Diagnosis not present

## 2017-03-19 ENCOUNTER — Other Ambulatory Visit: Payer: Self-pay | Admitting: Obstetrics and Gynecology

## 2017-03-19 DIAGNOSIS — Z1231 Encounter for screening mammogram for malignant neoplasm of breast: Secondary | ICD-10-CM

## 2017-03-25 ENCOUNTER — Other Ambulatory Visit: Payer: Self-pay | Admitting: Neurology

## 2017-03-25 DIAGNOSIS — R7989 Other specified abnormal findings of blood chemistry: Secondary | ICD-10-CM | POA: Diagnosis not present

## 2017-03-25 DIAGNOSIS — E538 Deficiency of other specified B group vitamins: Secondary | ICD-10-CM | POA: Diagnosis not present

## 2017-03-25 DIAGNOSIS — Z01419 Encounter for gynecological examination (general) (routine) without abnormal findings: Secondary | ICD-10-CM | POA: Diagnosis not present

## 2017-03-25 DIAGNOSIS — G43719 Chronic migraine without aura, intractable, without status migrainosus: Secondary | ICD-10-CM | POA: Diagnosis not present

## 2017-03-26 DIAGNOSIS — G43719 Chronic migraine without aura, intractable, without status migrainosus: Secondary | ICD-10-CM | POA: Diagnosis not present

## 2017-03-26 DIAGNOSIS — Z01419 Encounter for gynecological examination (general) (routine) without abnormal findings: Secondary | ICD-10-CM | POA: Diagnosis not present

## 2017-03-26 DIAGNOSIS — E538 Deficiency of other specified B group vitamins: Secondary | ICD-10-CM | POA: Diagnosis not present

## 2017-03-26 DIAGNOSIS — R7989 Other specified abnormal findings of blood chemistry: Secondary | ICD-10-CM | POA: Diagnosis not present

## 2017-04-01 ENCOUNTER — Ambulatory Visit: Payer: 59

## 2017-05-26 ENCOUNTER — Ambulatory Visit
Admission: RE | Admit: 2017-05-26 | Discharge: 2017-05-26 | Disposition: A | Discharge status: Home / Self Care | Payer: 59 | Source: Ambulatory Visit | Attending: Obstetrics and Gynecology | Admitting: Obstetrics and Gynecology

## 2017-05-26 DIAGNOSIS — Z1231 Encounter for screening mammogram for malignant neoplasm of breast: Secondary | ICD-10-CM | POA: Insufficient documentation

## 2017-11-15 ENCOUNTER — Ambulatory Visit (INDEPENDENT_AMBULATORY_CARE_PROVIDER_SITE_OTHER): Payer: Self-pay | Admitting: Physician Assistant

## 2017-11-15 VITALS — BP 118/76 | HR 68 | Temp 98.4°F | Resp 20 | Wt 138.0 lb

## 2017-11-15 DIAGNOSIS — M62830 Muscle spasm of back: Secondary | ICD-10-CM | POA: Diagnosis not present

## 2017-11-15 DIAGNOSIS — M545 Low back pain, unspecified: Secondary | ICD-10-CM

## 2017-11-15 MED ORDER — MELOXICAM 15 MG PO TABS: 15.0000 mg | ORAL_TABLET | Freq: Every day | ORAL | 0 refills | Status: DC

## 2017-11-15 MED ORDER — METAXALONE 800 MG PO TABS: 800.0000 mg | ORAL_TABLET | Freq: Three times a day (TID) | ORAL | 0 refills | Status: DC

## 2017-11-15 NOTE — Progress Notes (Signed)
Patient ID: Monica Kaiser DOB: 03/26/1975 AGE: 42 y.o. MRN: [id number]   PCP: [place] Pink, MD   Chief Complaint:  Chief Complaint  Patient presents with  . severe back painx7     Subjective:    HPI:  Monica Kaiser is a 42 y.o. female presents for evaluation  Chief Complaint  Patient presents with  . severe back painx72   42 year old female presents to [facility] with one week history of low back pain. Began as mild. Gradually worsening. Now severe. Constant ache. Located in low back, bilateral. Equal bilaterally. Exacerbated with sitting position. Episodes of tightness. No known precipitating injury/trauma. 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 Patient also recently moved; frequent heavy lifting and bending to lift boxes. Has applied heat and ice. Has applied Bengay and previously prescribed Capsaicin cream. Has performed stretching exercises. Has taken OTC ibuprofen and Tylenol. Denies saddle anesthesia, bowel/bladder incontinence, UTI symptoms (urinary frequency, urgency, dysuria, etc), radiating lower extremity pain, lower extremity weakness/paresthesias. Patient denies significant history of low back pain. Patient does report recent history of increasing fatigue.  Patient seen by Dr. Mariana Arn with [facility] on 06/23/2013 for acute low back pain. Treated with Flexeril prn, ibuprofen, and at-home stretching exercises. At that time, urine was sent for culture, grew group B strep, patient was prescribed Amoxicillin. Patient in January 2016 developed right flank pain. Treated for pyelonephritis with Ceftin. Ultrasound of kidneys performed, 01/30/2014, revealed no abnormality.  A complete, at least 10 system review of symptoms was performed, pertinent positives and negatives as mentioned in HPI, otherwise negative.  The following portions of the patient's history were reviewed and updated as appropriate: allergies, current medications  and past medical history.  Patient Active Problem List   Diagnosis Date Noted  . Anxiety 08/24/2016  . Benign cyst of right breast 08/24/2016  . Headache 03/15/2013    No Known Allergies  Current Outpatient Medications on File Prior to Visit  Medication Sig Dispense Refill  . norgestimate-ethinyl estradiol (ORTHO-CYCLEN,SPRINTEC,PREVIFEM) 0.25-35 MG-MCG tablet Take by mouth.    . ondansetron (ZOFRAN-ODT) 8 MG disintegrating tablet Take by mouth.    . SUMAtriptan (IMITREX) 25 MG tablet Take by mouth.    Marland Kitchen azithromycin (ZITHROMAX Z-PAK) 250 MG tablet 2 pills today then 1 pill a day for 4 days (Patient not taking: Reported on 01/13/2017) 6 each 0  . benzonatate (TESSALON) 200 MG capsule Take 1 capsule (200 mg total) by mouth 2 (two) times daily as needed for cough. (Patient not taking: Reported on 01/13/2017) 20 capsule 0  . meloxicam (MOBIC) 15 MG tablet Take 1 tablet (15 mg total) by mouth daily. (Patient not taking: Reported on 01/13/2017) 30 tablet 3   No current facility-administered medications on file prior to visit.        Objective:   Vitals:   11/15/17 0850  BP: 118/76  Pulse: 68  Resp: 20  Temp: 98.4 F (36.9 C)  SpO2: 99%     Wt Readings from Last 3 Encounters:  11/15/17 138 lb (62.6 kg)  01/20/17 140 lb (63.5 kg)    Physical Exam:   General Appearance:  Alert, cooperative, appears stated age. In no acute distress. Afebrile. Patient sitting on examination table, appears mildly uncomfortable.  Head:  Normocephalic, without obvious abnormality, atraumatic  Eyes:  PERRL, conjunctiva/corneas clear, EOM's intact, fundi benign, both eyes  Ears:  Normal TM's and external ear canals, both ears  Nose: Nares normal, septum  midline. No discharge. Normal mucosa. No sinus tenderness with percussion/palpation.  Throat: Lips, mucosa, and tongue normal; teeth and gums normal. Throat reveals no erythema. Tonsils with no enlargement or exudate.  Neck: Supple, symmetrical, trachea  midline, no adenopathy;  thyroid: not enlarged, symmetric, no tenderness/mass/nodules; no carotid bruit or JVD  Back:   Symmetric, no curvature. No midline thoracic or lumbar spine tenderness. No palpable stepoff or deformity. Mild palpable bilateral lower lumbar paraspinal muscular tightness. No tenderness with palpation. No tenderness with palpation over SI joints. Limited ROM; primarily with flexion, able to flex 15 degrees, limited due to pain. Mild discomfort with lateral flexion. No difficulty with rotation or extension. 5/5 lower extremity muscle strength. Mild gait abnormality; patient shuffling and hunching at waist due to pain. Negative straight leg raise bilaterally.  Lungs:   Clear to auscultation bilaterally, respirations unlabored  Heart:  Regular rate and rhythm, S1 and S2 normal, no murmur, rub, or gallop  Abdomen:   Soft, non-tender, bowel sounds active all four quadrants,  no masses, no organomegaly  Extremities: Extremities normal, atraumatic, no cyanosis or edema  Pulses: 2+ and symmetric  Skin: Skin color, texture, turgor normal, no rashes or lesions  Lymph nodes: Cervical, supraclavicular, and axillary nodes normal  Neurologic: Normal    Assessment & Plan:    Exam findings, diagnosis etiology and medication use and indications reviewed with patient. Follow-Up and discharge instructions provided. No emergent/urgent issues found on exam.  Patient education was provided.   Patient verbalized understanding of information provided and agrees with plan of care (POC), all questions answered. The patient is advised to call or return to clinic if condition does not see an improvement in symptoms, or to seek the care of the closest emergency department if condition worsens with the below plan.   1. Acute bilateral low back pain without sciatica  - meloxicam (MOBIC) 15 MG tablet; Take 1 tablet (15 mg total) by mouth daily for 14 days.  Dispense: 14 tablet; Refill: 0 - metaxalone  (SKELAXIN) 800 MG tablet; Take 1 tablet (800 mg total) by mouth 3 (three) times daily.  Dispense: 30 tablet; Refill: 0  Patient with one week history of low back pain. No red flag history or physical exam findings. Suspect musculoskeletal injury from work and moving. Prescribed anti-inflammatory (Meloxicam) and muscle relaxer (Metaxalone). Gave AAOS low back exercises. Gave patient one week off from work. Discussed if limited improvement, f/u with orthopedist for further evaluation and treatment (suspect imaging and PT referral). Patient agreed with plan.  Darlin Priestly, MHS, PA-C Montey Hora, MHS, PA-C Advanced Practice Provider [facility]  [address], [facility], [place], [place] [address] (p):  [phone] [email or web address]

## 2017-11-15 NOTE — Patient Instructions (Addendum)
Thank you for choosing [facility] for your health care needs.  You have been diagnosed with acute low back pain.  1. Acute bilateral low back pain without sciatica  - meloxicam (MOBIC) 15 MG tablet; Take 1 tablet (15 mg total) by mouth daily for 14 days.  Dispense: 14 tablet; Refill: 0 - metaxalone (SKELAXIN) 800 MG tablet; Take 1 tablet (800 mg total) by mouth 3 (three) times daily.  Dispense: 30 tablet; Refill: 0  Take prescription medication as advised.  Perform at home stretching exercises. Continue to use heat/ice, 15-20 minutes at a time, several times a day. May use [place], IcyHot, or BioFreeze.  Follow-up with family physician or orthopedist for further evaluation, including imaging, if no improvement in one week. Sooner with any worsening symptoms.  Back Pain, Adult Back pain is very common. The pain often gets better over time. The cause of back pain is usually not dangerous. Most people can learn to manage their back pain on their own. Follow these instructions at home: Watch your back pain for any changes. The following actions may help to lessen any pain you are feeling:  Stay active. Start with short walks on flat ground if you can. Try to walk farther each day.  Exercise regularly as told by your doctor. Exercise helps your back heal faster. It also helps avoid future injury by keeping your muscles strong and flexible.  Do not sit, drive, or stand in one place for more than 30 minutes.  Do not stay in bed. Resting more than 1-2 days can slow down your recovery.  Be careful when you bend or lift an object. Use good form when lifting: ? Bend at your knees. ? Keep the object close to your body. ? Do not twist.  Sleep on a firm mattress. Lie on your side, and bend your knees. If you lie on your back, put a pillow under your knees.  Take medicines only as told by your doctor.  Put ice on the injured area. ? Put ice in a plastic bag. ? Place a towel between your skin  and the bag. ? Leave the ice on for 20 minutes, 2-3 times a day for the first 2-3 days. After that, you can switch between ice and heat packs.  Avoid feeling anxious or stressed. Find good ways to deal with stress, such as exercise.  Maintain a healthy weight. Extra weight puts stress on your back.  Contact a doctor if:  You have pain that does not go away with rest or medicine.  You have worsening pain that goes down into your legs or buttocks.  You have pain that does not get better in one week.  You have pain at night.  You lose weight.  You have a fever or chills. Get help right away if:  You cannot control when you poop (bowel movement) or pee (urinate).  Your arms or legs feel weak.  Your arms or legs lose feeling (numbness).  You feel sick to your stomach (nauseous) or throw up (vomit).  You have belly (abdominal) pain.  You feel like you may pass out (faint). This information is not intended to replace advice given to you by your health care provider. Make sure you discuss any questions you have with your health care provider. Document Released: 06/10/2007 Document Revised: 05/30/2015 Document Reviewed: 04/25/2013 Elsevier Interactive Patient Education  [employer].

## 2017-11-17 ENCOUNTER — Telehealth: Payer: Self-pay | Admitting: Emergency Medicine

## 2017-11-17 NOTE — Telephone Encounter (Signed)
Left message follow up call from [facility] visit

## 2017-11-23 DIAGNOSIS — R079 Chest pain, unspecified: Secondary | ICD-10-CM | POA: Diagnosis not present

## 2017-11-23 DIAGNOSIS — R0789 Other chest pain: Secondary | ICD-10-CM | POA: Diagnosis not present

## 2017-11-26 ENCOUNTER — Ambulatory Visit (INDEPENDENT_AMBULATORY_CARE_PROVIDER_SITE_OTHER): Payer: 59

## 2017-11-26 ENCOUNTER — Ambulatory Visit: Payer: 59 | Admitting: Internal Medicine

## 2017-11-26 ENCOUNTER — Encounter: Payer: Self-pay | Admitting: Internal Medicine

## 2017-11-26 VITALS — BP 120/80 | HR 60 | Temp 98.7°F | Ht 66.0 in | Wt 141.2 lb

## 2017-11-26 DIAGNOSIS — M545 Low back pain, unspecified: Secondary | ICD-10-CM | POA: Insufficient documentation

## 2017-11-26 DIAGNOSIS — Z1389 Encounter for screening for other disorder: Secondary | ICD-10-CM

## 2017-11-26 DIAGNOSIS — G43919 Migraine, unspecified, intractable, without status migrainosus: Secondary | ICD-10-CM | POA: Insufficient documentation

## 2017-11-26 DIAGNOSIS — R5383 Other fatigue: Secondary | ICD-10-CM

## 2017-11-26 DIAGNOSIS — E611 Iron deficiency: Secondary | ICD-10-CM

## 2017-11-26 DIAGNOSIS — E559 Vitamin D deficiency, unspecified: Secondary | ICD-10-CM

## 2017-11-26 DIAGNOSIS — Z1159 Encounter for screening for other viral diseases: Secondary | ICD-10-CM

## 2017-11-26 DIAGNOSIS — G8929 Other chronic pain: Secondary | ICD-10-CM

## 2017-11-26 DIAGNOSIS — Z0184 Encounter for antibody response examination: Secondary | ICD-10-CM | POA: Diagnosis not present

## 2017-11-26 DIAGNOSIS — G43909 Migraine, unspecified, not intractable, without status migrainosus: Secondary | ICD-10-CM

## 2017-11-26 DIAGNOSIS — F419 Anxiety disorder, unspecified: Secondary | ICD-10-CM

## 2017-11-26 DIAGNOSIS — R002 Palpitations: Secondary | ICD-10-CM

## 2017-11-26 LAB — COMPREHENSIVE METABOLIC PANEL
ALK PHOS: 41 U/L (ref 39–117)
ALT: 13 U/L (ref 0–35)
AST: 13 U/L (ref 0–37)
Albumin: 4.4 g/dL (ref 3.5–5.2)
BILIRUBIN TOTAL: 0.3 mg/dL (ref 0.2–1.2)
BUN: 11 mg/dL (ref 6–23)
CO2: 29 mEq/L (ref 19–32)
CREATININE: 0.58 mg/dL (ref 0.40–1.20)
Calcium: 9.2 mg/dL (ref 8.4–10.5)
Chloride: 106 mEq/L (ref 96–112)
GFR: 121.61 mL/min (ref >60.00)
GLUCOSE: 87 mg/dL (ref 70–99)
Potassium: 3.7 mEq/L (ref 3.5–5.1)
SODIUM: 142 meq/L (ref 135–145)
TOTAL PROTEIN: 7.7 g/dL (ref 6.0–8.3)

## 2017-11-26 LAB — IRON,TIBC AND FERRITIN PANEL
%SAT: 29 % (calc) (ref 16–45)
Ferritin: 32 ng/mL (ref 16–232)
IRON: 103 ug/dL (ref 40–190)
TIBC: 357 mcg/dL (calc) (ref 250–450)

## 2017-11-26 LAB — CBC WITH DIFFERENTIAL/PLATELET
BASOS ABS: 0.1 10*3/uL (ref 0.0–0.1)
Basophils Relative: 0.8 % (ref 0.0–3.0)
EOS ABS: 0 10*3/uL (ref 0.0–0.7)
Eosinophils Relative: 0.3 % (ref 0.0–5.0)
HCT: 39.3 % (ref 36.0–46.0)
Hemoglobin: 13.5 g/dL (ref 12.0–15.0)
LYMPHS ABS: 3.1 10*3/uL (ref 0.7–4.0)
Lymphocytes Relative: 26.5 % (ref 12.0–46.0)
MCHC: 34.4 g/dL (ref 30.0–36.0)
MCV: 90.5 fl (ref 78.0–100.0)
Monocytes Absolute: 0.7 10*3/uL (ref 0.1–1.0)
Monocytes Relative: 6.2 % (ref 3.0–12.0)
NEUTROS ABS: 7.7 10*3/uL (ref 1.4–7.7)
NEUTROS PCT: 66.2 % (ref 43.0–77.0)
PLATELETS: 343 10*3/uL (ref 150.0–400.0)
RBC: 4.34 Mil/uL (ref 3.87–5.11)
RDW: 12.8 % (ref 11.5–15.5)
WBC: 11.6 10*3/uL — ABNORMAL HIGH (ref 4.0–10.5)

## 2017-11-26 LAB — VITAMIN D 25 HYDROXY (VIT D DEFICIENCY, FRACTURES): VITD: 31.23 ng/mL (ref 30.00–100.00)

## 2017-11-26 LAB — TSH: TSH: 0.79 u[IU]/mL (ref 0.35–4.50)

## 2017-11-26 LAB — POCT URINE PREGNANCY: PREG TEST UR: NEGATIVE

## 2017-11-26 NOTE — Progress Notes (Signed)
Pre visit review using our clinic review tool, if applicable. No additional management support is needed unless otherwise documented below in the visit note. 

## 2017-11-26 NOTE — Patient Instructions (Addendum)
B2 200 mg 2x per day  Calcium 600 mg 2x per day  Vitamin D3 2000 IU daily  Magnesium 400-500 mg 1x per day   L theanine Whole or [employer]vest chewable (sleep, stress/anxiety)  Back stretches on [facility] or [place] [facility]   Fatigue Fatigue is feeling tired all of the time, a lack of energy, or a lack of motivation. Occasional or mild fatigue is often a normal response to activity or life in general. However, long-lasting (chronic) or extreme fatigue may indicate an underlying medical condition. Follow these instructions at home: Watch your fatigue for any changes. The following actions may help to lessen any discomfort you are feeling:  Talk to your health care provider about how much sleep you need each night. Try to get the required amount every night.  Take medicines only as directed by your health care provider.  Eat a healthy and nutritious diet. Ask your health care provider if you need help changing your diet.  Drink enough fluid to keep your urine clear or pale yellow.  Practice ways of relaxing, such as yoga, meditation, massage therapy, or acupuncture.  Exercise regularly.  Change situations that cause you stress. Try to keep your work and personal routine reasonable.  Do not abuse illegal drugs.  Limit alcohol intake to no more than 1 drink per day for nonpregnant women and 2 drinks per day for men. One drink equals 12 ounces of beer, 5 ounces of wine, or 1 ounces of hard liquor.  Take a multivitamin, if directed by your health care provider.  Contact a health care provider if:  Your fatigue does not get better.  You have a fever.  You have unintentional weight loss or gain.  You have headaches.  You have difficulty: ? Falling asleep. ? Sleeping throughout the night.  You feel angry, guilty, anxious, or sad.  You are unable to have a bowel movement (constipation).  You skin is dry.  Your legs or another part of your body is swollen. Get help right away  if:  You feel confused.  Your vision is blurry.  You feel faint or pass out.  You have a severe headache.  You have severe abdominal, pelvic, or back pain.  You have chest pain, shortness of breath, or an irregular or fast heartbeat.  You are unable to urinate or you urinate less than normal.  You develop abnormal bleeding, such as bleeding from the rectum, vagina, nose, lungs, or nipples.  You vomit blood.  You have thoughts about harming yourself or committing suicide.  You are worried that you might harm someone else. This information is not intended to replace advice given to you by your health care provider. Make sure you discuss any questions you have with your health care provider. Document Released: 10/19/2006 Document Revised: 05/30/2015 Document Reviewed: 04/25/2013 Elsevier Interactive Patient Education  2018 [employer].   DTaP Vaccine (Diphtheria, Tetanus, and Pertussis): What You Need to Know 1. Why get vaccinated? Diphtheria, tetanus, and pertussis are serious diseases caused by bacteria. Diphtheria and pertussis are spread from person to person. Tetanus enters the body through cuts or wounds. DIPHTHERIA causes a thick covering in the back of the throat.  It can lead to breathing problems, paralysis, heart failure, and even death.  TETANUS (Lockjaw) causes painful tightening of the muscles, usually all over the body.  It can lead to "locking" of the jaw so the victim cannot open his mouth or swallow. Tetanus leads to death in  up to 2 out of 10 cases.  PERTUSSIS (Whooping Cough) causes coughing spells so bad that it is hard for infants to eat, drink, or breathe. These spells can last for weeks.  It can lead to pneumonia, seizures (jerking and staring spells), brain damage, and death.  Diphtheria, tetanus, and pertussis vaccine (DTaP) can help prevent these diseases. Most children who are vaccinated with DTaP will be protected throughout childhood. Many more  children would get these diseases if we stopped vaccinating. DTaP is a safer version of an older vaccine called DTP. DTP is no longer used in the [place]. 2. Who should get DTaP vaccine and when? Children should get 5 doses of DTaP vaccine, one dose at each of the following ages:  2 months  4 months  6 months  15-18 months  4-6 years  DTaP may be given at the same time as other vaccines. 3. Some children should not get DTaP vaccine or should wait  Children with minor illnesses, such as a cold, may be vaccinated. But children who are moderately or severely ill should usually wait until they recover before getting DTaP vaccine.  Any child who had a life-threatening allergic reaction after a dose of DTaP should not get another dose.  Any child who suffered a brain or nervous system disease within 7 days after a dose of DTaP should not get another dose.  Talk with your doctor if your child: ? had a seizure or collapsed after a dose of DTaP, ? cried non-stop for 3 hours or more after a dose of DTaP, ? had a fever over 105F after a dose of DTaP. Ask your doctor for more information. Some of these children should not get another dose of pertussis vaccine, but may get a vaccine without pertussis, called DT. 4. Older children and adults DTaP is not licensed for adolescents, adults, or children 27 years of age and older. But older people still need protection. A vaccine called Tdap is similar to DTaP. A single dose of Tdap is recommended for people 11 through 41 years of age. Another vaccine, called Td, protects against tetanus and diphtheria, but not pertussis. It is recommended every 10 years. There are separate Vaccine Information Statements for these vaccines. 5. What are the risks from DTaP vaccine? Getting diphtheria, tetanus, or pertussis disease is much riskier than getting DTaP vaccine. However, a vaccine, like any medicine, is capable of causing serious problems, such as  severe allergic reactions. The risk of DTaP vaccine causing serious harm, or death, is extremely small. Mild problems (common)  Fever (up to about 1 child in 4)  Redness or swelling where the shot was given (up to about 1 child in 4)  Soreness or tenderness where the shot was given (up to about 1 child in 4) These problems occur more often after the 4th and 5th doses of the DTaP series than after earlier doses. Sometimes the 4th or 5th dose of DTaP vaccine is followed by swelling of the entire arm or leg in which the shot was given, lasting 1-7 days (up to about 1 child in 36). Other mild problems include:  Fussiness (up to about 1 child in 3)  Tiredness or poor appetite (up to about 1 child in 10)  Vomiting (up to about 1 child in 34) These problems generally occur 1-3 days after the shot. Moderate problems (uncommon)  Seizure (jerking or staring) (about 1 child out of 14,000)  Non-stop crying, for 3 hours or more (  up to about 1 child out of 1,000)  High fever, over 105F (about 1 child out of 16,000) Severe problems (very rare)  Serious allergic reaction (less than 1 out of a million doses)  Several other severe problems have been reported after DTaP vaccine. These include: ? Long-term seizures, coma, or lowered consciousness ? Permanent brain damage. These are so rare it is hard to tell if they are caused by the vaccine. Controlling fever is especially important for children who have had seizures, for any reason. It is also important if another family member has had seizures. You can reduce fever and pain by giving your child an aspirin-free pain reliever when the shot is given, and for the next 24 hours, following the package instructions. 6. What if there is a serious reaction? What should I look for? Look for anything that concerns you, such as signs of a severe allergic reaction, very high fever, or behavior changes. Signs of a severe allergic reaction can include hives,  swelling of the face and throat, difficulty breathing, a fast heartbeat, dizziness, and weakness. These would start a few minutes to a few hours after the vaccination. What should I do?  If you think it is a severe allergic reaction or other emergency that can't wait, call 9-1-1 or get the person to the nearest hospital. Otherwise, call your doctor.  Afterward, the reaction should be reported to the Vaccine Adverse Event Reporting System (VAERS). Your doctor might file this report, or you can do it yourself through the VAERS web site at www.vaers.SamedayNews.es, or by calling (418)014-4657. ? VAERS is only for reporting reactions. They do not give medical advice. 7. The National Vaccine Injury Compensation Program The [employer] Vaccine Injury Compensation Program (VICP) is a federal program that was created to compensate people who may have been injured by certain vaccines. Persons who believe they may have been injured by a vaccine can learn about the program and about filing a claim by calling 6187114504 or visiting the [place] website at GoldCloset.com.ee. 8. How can I learn more?  Ask your doctor.  Call your local or state health department.  Contact the Centers for Disease Control and Prevention (CDC): ? Call (717)611-1939 (1-800-CDC-INFO) or ? Visit CDC's website at http://hunter.com/ CDC DTaP Vaccine (Diphtheria, Tetanus, and Pertussis) VIS (05/21/05) This information is not intended to replace advice given to you by your health care provider. Make sure you discuss any questions you have with your health care provider. Document Released: 10/19/2005 Document Revised: 09/12/2015 Document Reviewed: 09/12/2015 Elsevier Interactive Patient Education  2017 [employer].   Migraine Headache A migraine headache is an intense, throbbing pain on one side or both sides of the head. Migraines may also cause other symptoms, such as nausea, vomiting, and sensitivity to light and  noise. What are the causes? Doing or taking certain things may also trigger migraines, such as:  Alcohol.  Smoking.  Medicines, such as: ? Medicine used to treat chest pain (nitroglycerine). ? Birth control pills. ? Estrogen pills. ? Certain blood pressure medicines.  Aged cheeses, chocolate, or caffeine.  Foods or drinks that contain nitrates, glutamate, aspartame, or tyramine.  Physical activity.  Other things that may trigger a migraine include:  Menstruation.  Pregnancy.  Hunger.  Stress, lack of sleep, too much sleep, or fatigue.  Weather changes.  What increases the risk? The following factors may make you more likely to experience migraine headaches:  Age. Risk increases with age.  Family history of migraine headaches.  Being Caucasian.  Depression and anxiety.  Obesity.  Being a woman.  Having a hole in the heart (patent foramen ovale) or other heart problems.  What are the signs or symptoms? The main symptom of this condition is pulsating or throbbing pain. Pain may:  Happen in any area of the head, such as on one side or both sides.  Interfere with daily activities.  Get worse with physical activity.  Get worse with exposure to bright lights or loud noises.  Other symptoms may include:  Nausea.  Vomiting.  Dizziness.  General sensitivity to bright lights, loud noises, or smells.  Before you get a migraine, you may get warning signs that a migraine is developing (aura). An aura may include:  Seeing flashing lights or having blind spots.  Seeing bright spots, halos, or zigzag lines.  Having tunnel vision or blurred vision.  Having numbness or a tingling feeling.  Having trouble talking.  Having muscle weakness.  How is this diagnosed? A migraine headache can be diagnosed based on:  Your symptoms.  A physical exam.  Tests, such as CT scan or MRI of the head. These imaging tests can help rule out other causes of  headaches.  Taking fluid from the spine (lumbar puncture) and analyzing it (cerebrospinal fluid analysis, or CSF analysis).  How is this treated? A migraine headache is usually treated with medicines that:  Relieve pain.  Relieve nausea.  Prevent migraines from coming back.  Treatment may also include:  Acupuncture.  Lifestyle changes like avoiding foods that trigger migraines.  Follow these instructions at home: Medicines  Take over-the-counter and prescription medicines only as told by your health care provider.  Do not drive or use heavy machinery while taking prescription pain medicine.  To prevent or treat constipation while you are taking prescription pain medicine, your health care provider may recommend that you: ? Drink enough fluid to keep your urine clear or pale yellow. ? Take over-the-counter or prescription medicines. ? Eat foods that are high in fiber, such as fresh fruits and vegetables, whole grains, and beans. ? Limit foods that are high in fat and processed sugars, such as fried and sweet foods. Lifestyle  Avoid alcohol use.  Do not use any products that contain nicotine or tobacco, such as cigarettes and e-cigarettes. If you need help quitting, ask your health care provider.  Get at least 8 hours of sleep every night.  Limit your stress. General instructions   Keep a journal to find out what may trigger your migraine headaches. For example, write down: ? What you eat and drink. ? How much sleep you get. ? Any change to your diet or medicines.  If you have a migraine: ? Avoid things that make your symptoms worse, such as bright lights. ? It may help to lie down in a dark, quiet room. ? Do not drive or use heavy machinery. ? Ask your health care provider what activities are safe for you while you are experiencing symptoms.  Keep all follow-up visits as told by your health care provider. This is important. Contact a health care provider  if:  You develop symptoms that are different or more severe than your usual migraine symptoms. Get help right away if:  Your migraine becomes severe.  You have a fever.  You have a stiff neck.  You have vision loss.  Your muscles feel weak or like you cannot control them.  You start to lose your balance often.  You develop trouble  walking.  You faint. This information is not intended to replace advice given to you by your health care provider. Make sure you discuss any questions you have with your health care provider. Document Released: 12/22/2004 Document Revised: 07/12/2015 Document Reviewed: 06/10/2015 Elsevier Interactive Patient Education  2017 [employer]   Tension Headache A tension headache is a feeling of pain, pressure, or aching that is often felt over the front and sides of the head. The pain can be dull, or it can feel tight (constricting). Tension headaches are not normally associated with nausea or vomiting, and they do not get worse with physical activity. Tension headaches can last from 30 minutes to several days. This is the most common type of headache. CAUSES The exact cause of this condition is not known. Tension headaches often begin after stress, anxiety, or depression. Other triggers may include:  Alcohol.  Too much caffeine, or caffeine withdrawal.  Respiratory infections, such as colds, flu, or sinus infections.  Dental problems or teeth clenching.  Fatigue.  Holding your head and neck in the same position for a long period of time, such as while using a computer.  Smoking. SYMPTOMS Symptoms of this condition include:  A feeling of pressure around the head.  Dull, aching head pain.  Pain felt over the front and sides of the head.  Tenderness in the muscles of the head, neck, and shoulders. DIAGNOSIS This condition may be diagnosed based on your symptoms and a physical exam. Tests may be done, such as a CT scan or an MRI of your head.  These tests may be done if your symptoms are severe or unusual. TREATMENT This condition may be treated with lifestyle changes and medicines to help relieve symptoms. HOME CARE INSTRUCTIONS Managing Pain  Take over-the-counter and prescription medicines only as told by your health care provider.  Lie down in a dark, quiet room when you have a headache.  If directed, apply ice to the head and neck area: ? Put ice in a plastic bag. ? Place a towel between your skin and the bag. ? Leave the ice on for 20 minutes, 2-3 times per day.  Use a heating pad or a hot shower to apply heat to the head and neck area as told by your health care provider. Eating and Drinking  Eat meals on a regular schedule.  Limit alcohol use.  Decrease your caffeine intake, or stop using caffeine. General Instructions  Keep all follow-up visits as told by your health care provider. This is important.  Keep a headache journal to help find out what may trigger your headaches. For example, write down: ? What you eat and drink. ? How much sleep you get. ? Any change to your diet or medicines.  Try massage or other relaxation techniques.  Limit stress.  Sit up straight, and avoid tensing your muscles.  Do not use tobacco products, including cigarettes, chewing tobacco, or e-cigarettes. If you need help quitting, ask your health care provider.  Exercise regularly as told by your health care provider.  Get 7-9 hours of sleep, or the amount recommended by your health care provider. SEEK MEDICAL CARE IF:  Your symptoms are not helped by medicine.  You have a headache that is different from what you normally experience.  You have nausea or you vomit.  You have a fever. SEEK IMMEDIATE MEDICAL CARE IF:  Your headache becomes severe.  You have repeated vomiting.  You have a stiff neck.  You have a  loss of vision.  You have problems with speech.  You have pain in your eye or ear.  You have  muscular weakness or loss of muscle control.  You lose your balance or you have trouble walking.  You feel faint or you pass out.  You have confusion. This information is not intended to replace advice given to you by your health care provider. Make sure you discuss any questions you have with your health care provider. Document Released: 12/22/2004 Document Revised: 09/12/2014 Document Reviewed: 04/16/2014 Elsevier Interactive Patient Education  2017 [employer].   Palpitations A palpitation is the feeling that your heartbeat is irregular or is faster than normal. It may feel like your heart is fluttering or skipping a beat. Palpitations are usually not a serious problem. They may be caused by many things, including smoking, caffeine, alcohol, stress, and certain medicines. Although most causes of palpitations are not serious, palpitations can be a sign of a serious medical problem. In some cases, you may need further medical evaluation. Follow these instructions at home: Pay attention to any changes in your symptoms. Take these actions to help with your condition:  Avoid the following: ? Caffeinated coffee, tea, soft drinks, diet pills, and energy drinks. ? Chocolate. ? Alcohol.  Do not use any tobacco products, such as cigarettes, chewing tobacco, and e-cigarettes. If you need help quitting, ask your health care provider.  Try to reduce your stress and anxiety. Things that can help you relax include: ? Yoga. ? Meditation. ? Physical activity, such as swimming, jogging, or walking. ? Biofeedback. This is a method that helps you learn to use your mind to control things in your body, such as your heartbeats.  Get plenty of rest and sleep.  Take over-the-counter and prescription medicines only as told by your health care provider.  Keep all follow-up visits as told by your health care provider. This is important.  Contact a health care provider if:  You continue to have a fast  or irregular heartbeat after 24 hours.  Your palpitations occur more often. Get help right away if:  You have chest pain or shortness of breath.  You have a severe headache.  You feel dizzy or you faint. This information is not intended to replace advice given to you by your health care provider. Make sure you discuss any questions you have with your health care provider. Document Released: 12/20/1999 Document Revised: 05/27/2015 Document Reviewed: 09/06/2014 Elsevier Interactive Patient Education  2018 [employer].    Back Pain, Adult Many adults have back pain from time to time. Common causes of back pain include:  A strained muscle or ligament.  Wear and tear (degeneration) of the spinal disks.  Arthritis.  A hit to the back.  Back pain can be short-lived (acute) or last a long time (chronic). A physical exam, lab tests, and imaging studies may be done to find the cause of your pain. Follow these instructions at home: Managing pain and stiffness  Take over-the-counter and prescription medicines only as told by your health care provider.  If directed, apply heat to the affected area as often as told by your health care provider. Use the heat source that your health care provider recommends, such as a moist heat pack or a heating pad. ? Place a towel between your skin and the heat source. ? Leave the heat on for 20-30 minutes. ? Remove the heat if your skin turns bright red. This is especially important if you are  unable to feel pain, heat, or cold. You have a greater risk of getting burned.  If directed, apply ice to the injured area: ? Put ice in a plastic bag. ? Place a towel between your skin and the bag. ? Leave the ice on for 20 minutes, 2-3 times a day for the first 2-3 days. Activity  Do not stay in bed. Resting more than 1-2 days can delay your recovery.  Take short walks on even surfaces as soon as you are able. Try to increase the length of time you walk  each day.  Do not sit, drive, or stand in one place for more than 30 minutes at a time. Sitting or standing for long periods of time can put stress on your back.  Use proper lifting techniques. When you bend and lift, use positions that put less stress on your back: ? [place] your knees. ? Keep the load close to your body. ? Avoid twisting.  Exercise regularly as told by your health care provider. Exercising will help your back heal faster. This also helps prevent back injuries by keeping muscles strong and flexible.  Your health care provider may recommend that you see a physical therapist. This person can help you come up with a safe exercise program. Do any exercises as told by your physical therapist. Lifestyle  Maintain a healthy weight. Extra weight puts stress on your back and makes it difficult to have good posture.  Avoid activities or situations that make you feel anxious or stressed. Learn ways to manage anxiety and stress. One way to manage stress is through exercise. Stress and anxiety increase muscle tension and can make back pain worse. General instructions  Sleep on a firm mattress in a comfortable position. Try lying on your side with your knees slightly bent. If you lie on your back, put a pillow under your knees.  Follow your treatment plan as told by your health care provider. This may include: ? Cognitive or behavioral therapy. ? Acupuncture or massage therapy. ? Meditation or yoga. Contact a health care provider if:  You have pain that is not relieved with rest or medicine.  You have increasing pain going down into your legs or buttocks.  Your pain does not improve in 2 weeks.  You have pain at night.  You lose weight.  You have a fever or chills. Get help right away if:  You develop new bowel or bladder control problems.  You have unusual weakness or numbness in your arms or legs.  You develop nausea or vomiting.  You develop abdominal pain.  You  feel faint. Summary  Many adults have back pain from time to time. A physical exam, lab tests, and imaging studies may be done to find the cause of your pain.  Use proper lifting techniques. When you bend and lift, use positions that put less stress on your back.  Take over-the-counter and prescription medicines and apply heat or ice as directed by your health care provider. This information is not intended to replace advice given to you by your health care provider. Make sure you discuss any questions you have with your health care provider. Document Released: 12/22/2004 Document Revised: 01/27/2016 Document Reviewed: 01/27/2016 Elsevier Interactive Patient Education  [employer].

## 2017-11-26 NOTE — Progress Notes (Signed)
Chief Complaint  Patient presents with  . Establish Care   New patient  1. C/o fatigue though she is sleeping 7-8 hours at night and wants blood checked today   2. 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 Migraines uncontrolled she does have to take zofran and imitrex with advil prn, Tylenol and excedrin migraine. She declines to try antidepressant and did not tolerate topamax in the past and states she is not a good candidate for BB due to low normal BP at times.  -she does f/u neurology for migraines Dr. Manuella Ghazi last saw 03/25/17 rec B2 200 mg bid with meds   4. Chest pain recently saw urgent care and they thought it was MSK and given her prednisone 6day taper which was stopped Sunday EKG was normal, no CP today seen 11/23/17 with [facility] urgent care  5. C/o palpitations and checked pulse with dynamap at work and pulse one day was 130-140-170 but this only happens at work will monitor for now.   Review of Systems  Constitutional: Positive for malaise/fatigue. Negative for weight loss.  HENT: Negative for hearing loss.   Eyes: Negative for blurred vision.  Respiratory: Negative for shortness of breath.   Cardiovascular: Positive for palpitations. Negative for chest pain.  Gastrointestinal: Negative for abdominal pain.  Musculoskeletal: Positive for back pain. Negative for neck pain.  Skin: Negative for rash.  Neurological: Positive for headaches.  Psychiatric/Behavioral: The patient does not have insomnia.    Past Medical History:  Diagnosis Date  . Anxiety   . Fatigue   . Low back pain   . Migraine   . Miscarriage    Past Surgical History:  Procedure Laterality Date  . BREAST CYST EXCISION Right 2003   benign    Family History  Problem Relation Age of Onset  . Lung cancer Father 54  . Colon  cancer Father 75  . Cancer Father        lung and colon cancer age 20  . Arthritis Mother   . Diabetes Mother   . Hyperlipidemia Mother   . Hypertension Mother    Social History   Socioeconomic History  . Marital status: Married    Spouse name: Not on file  . Number of children: Not on file  . Years of education: Not on file  . Highest education level: Not on file  Occupational History  . Not on file  Social Needs  . Financial resource strain: Not on file  . Food insecurity:    Worry: Not on file    Inability: Not on file  . Transportation needs:    Medical: Not on file    Non-medical: Not on file  Tobacco Use  . Smoking status: Never Smoker  . Smokeless tobacco: Never Used  Substance and Sexual Activity  . Alcohol use: No  . Drug use: No  . Sexual activity: Yes  Lifestyle  . Physical activity:    Days per week: Not on file    Minutes per session: Not on file  . Stress: Not on file  Relationships  . Social connections:    Talks on phone: Not on file    Gets together: Not on file    Attends religious service: Not on file    Active member  of club or organization: Not on file    Attends meetings of clubs or organizations: Not on file    Relationship status: Not on file  . Intimate partner violence:    Fear of current or ex partner: Not on file    Emotionally abused: Not on file    Physically abused: Not on file    Forced sexual activity: Not on file  Other Topics Concern  . 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 norgestimate-ethinyl estradiol (ORTHO-CYCLEN,SPRINTEC,PREVIFEM) 0.25-35 MG-MCG tablet Take by mouth.  . ondansetron (ZOFRAN-ODT) 8 MG disintegrating tablet Take by mouth.  . SUMAtriptan (IMITREX) 25 MG tablet Take by mouth.  . [DISCONTINUED] azithromycin (ZITHROMAX Z-PAK) 250 MG tablet 2 pills today then 1 pill a day for 4 days  .  [DISCONTINUED] benzonatate (TESSALON) 200 MG capsule Take 1 capsule (200 mg total) by mouth 2 (two) times daily as needed for cough.  . [DISCONTINUED] meloxicam (MOBIC) 15 MG tablet Take 1 tablet (15 mg total) by mouth daily.  . [DISCONTINUED] meloxicam (MOBIC) 15 MG tablet Take 1 tablet (15 mg total) by mouth daily for 14 days.  . [DISCONTINUED] metaxalone (SKELAXIN) 800 MG tablet Take 1 tablet (800 mg total) by mouth 3 (three) times daily.   No Known Allergies Recent Results (from the past 2160 hour(s))  Comprehensive metabolic panel     Status: None   Collection Time: 11/26/17  8:52 AM  Result Value Ref Range   Sodium 142 135 - 145 mEq/L   Potassium 3.7 3.5 - 5.1 mEq/L   Chloride 106 96 - 112 mEq/L   CO2 29 19 - 32 mEq/L   Glucose, Bld 87 70 - 99 mg/dL   BUN 11 6 - 23 mg/dL   Creatinine, Ser 0.58 0.40 - 1.20 mg/dL   Total Bilirubin 0.3 0.2 - 1.2 mg/dL   Alkaline Phosphatase 41 39 - 117 U/L   AST 13 0 - 37 U/L   ALT 13 0 - 35 U/L   Total Protein 7.7 6.0 - 8.3 g/dL   Albumin 4.4 3.5 - 5.2 g/dL   Calcium 9.2 8.4 - 10.5 mg/dL   GFR 121.61 >60.00 mL/min  CBC with Differential/Platelet     Status: Abnormal   Collection Time: 11/26/17  8:52 AM  Result Value Ref Range   WBC 11.6 (H) 4.0 - 10.5 K/uL   RBC 4.34 3.87 - 5.11 Mil/uL   Hemoglobin 13.5 12.0 - 15.0 g/dL   HCT 39.3 36.0 - 46.0 %   MCV 90.5 78.0 - 100.0 fl   MCHC 34.4 30.0 - 36.0 g/dL   RDW 12.8 11.5 - 15.5 %   Platelets 343.0 150.0 - 400.0 K/uL   Neutrophils Relative % 66.2 43.0 - 77.0 %   Lymphocytes Relative 26.5 12.0 - 46.0 %   Monocytes Relative 6.2 3.0 - 12.0 %   Eosinophils Relative 0.3 0.0 - 5.0 %   Basophils Relative 0.8 0.0 - 3.0 %   Neutro Abs 7.7 1.4 - 7.7 K/uL   Lymphs Abs 3.1 0.7 - 4.0 K/uL   Monocytes Absolute 0.7 0.1 - 1.0 K/uL   Eosinophils Absolute 0.0 0.0 - 0.7 K/uL   Basophils Absolute 0.1 0.0 - 0.1 K/uL  TSH     Status: None   Collection  Time: 11/26/17  8:52 AM  Result Value Ref Range   TSH 0.79  0.35 - 4.50 uIU/mL  Vitamin D (25 hydroxy)     Status: None   Collection Time: 11/26/17  8:52 AM  Result Value Ref Range   VITD 31.23 30.00 - 100.00 ng/mL  Iron, TIBC and Ferritin Panel     Status: None   Collection Time: 11/26/17  8:52 AM  Result Value Ref Range   Iron 103 40 - 190 mcg/dL   TIBC 357 250 - 450 mcg/dL (calc)   %SAT 29 16 - 45 % (calc)   Ferritin 32 16 - 232 ng/mL  POCT urine pregnancy     Status: None   Collection Time: 11/26/17  9:12 AM  Result Value Ref Range   Preg Test, Ur Negative Negative   Objective  Body mass index is 22.79 kg/m. Wt Readings from Last 3 Encounters:  11/26/17 141 lb 3.2 oz (64 kg)  11/15/17 138 lb (62.6 kg)  01/20/17 140 lb (63.5 kg)   Temp Readings from Last 3 Encounters:  11/26/17 98.7 F (37.1 C) (Oral)  11/15/17 98.4 F (36.9 C) (Oral)  09/23/16 98.4 F (36.9 C)   BP Readings from Last 3 Encounters:  11/26/17 120/80  11/15/17 118/76  01/20/17 124/80   Pulse Readings from Last 3 Encounters:  11/26/17 60  11/15/17 68  11/05/16 67    Physical Exam  Constitutional: She is oriented to person, place, and time. Vital signs are normal. She appears well-developed and well-nourished. She is cooperative.  HENT:  Head: Normocephalic and atraumatic.  Mouth/Throat: Oropharynx is clear and moist and mucous membranes are normal.  Eyes: Pupils are equal, round, and reactive to light. Conjunctivae are normal.  Cardiovascular: Normal rate, regular rhythm and normal heart sounds.  Pulmonary/Chest: Effort normal and breath sounds normal.  Neurological: She is alert and oriented to person, place, and time. Gait normal.  Skin: Skin is warm, dry and intact.  Psychiatric: She has a normal mood and affect. Her speech is normal and behavior is normal. Judgment and thought content normal. Cognition and memory are normal.  Nursing note and vitals reviewed.   Assessment   1. Migraines uncontrolled  2. Fatigue no clear etiology could be related  to stress/anxiety work related, sleep is adequate  3. Palpitations and resolved likely MSK chest pain or anxiety related at work  4. Anxiety  5. Low back pain  6. HM Plan  1. F/u Dr. Manuella Ghazi add Magnesium 400-500 mg qd and B2 200 mg bid per neuro 2. W/u with labs today  3. Monitor pulse if continues refer to cards  4. Monitor Disc L theanine otc supplement  5. Xray today  Back stretches  Consider PT in future  Prn tylenol, nsaids sparingly  6.  Flu shot had 09/23/17  Tdap to think about   OB/GYN Dr. Leonides Schanz pap 03/15/17 neg pap neg HPV mammo 05/26/17 neg  Colonoscopy due age 24 y.o FH dad dx'ed 14 will start routine screening q5 years age 72 y.o  D3 2000 IU qd and calcium 600 mg 2x per day Declines STD testing   Lipid TC 163, TG 166, HDL 45.3, LDL 85 had 03/26/17   Provider: Dr. Olivia Mackie McLean-Scocuzza-[facility]

## 2017-11-29 LAB — MEASLES/MUMPS/RUBELLA IMMUNITY
RUBELLA: 3.97 {index}
RUBEOLA IGG: 219 [AU]/ml

## 2017-11-29 LAB — HEPATITIS B SURFACE ANTIBODY, QUANTITATIVE: HEPATITIS B-POST: 48 m[IU]/mL (ref >=10)

## 2017-12-03 ENCOUNTER — Encounter: Payer: Self-pay | Admitting: Internal Medicine

## 2017-12-06 ENCOUNTER — Ambulatory Visit: Payer: Self-pay | Admitting: *Deleted

## 2017-12-06 DIAGNOSIS — L249 Irritant contact dermatitis, unspecified cause: Secondary | ICD-10-CM | POA: Diagnosis not present

## 2017-12-06 NOTE — Telephone Encounter (Signed)
Patient had called about a rash on her face- she called back and stated she was able to get appointment with dermatology- disregard the call.

## 2017-12-07 ENCOUNTER — Ambulatory Visit: Payer: Self-pay | Admitting: Family Medicine

## 2017-12-24 ENCOUNTER — Ambulatory Visit: Payer: Self-pay | Admitting: Internal Medicine

## 2018-06-20 ENCOUNTER — Other Ambulatory Visit: Payer: Self-pay

## 2018-06-20 ENCOUNTER — Ambulatory Visit (INDEPENDENT_AMBULATORY_CARE_PROVIDER_SITE_OTHER): Payer: 59 | Admitting: Family Medicine

## 2018-06-20 ENCOUNTER — Encounter: Payer: Self-pay | Admitting: Family Medicine

## 2018-06-20 DIAGNOSIS — M62830 Muscle spasm of back: Secondary | ICD-10-CM

## 2018-06-20 DIAGNOSIS — M545 Low back pain, unspecified: Secondary | ICD-10-CM

## 2018-06-20 MED ORDER — CYCLOBENZAPRINE HCL 5 MG PO TABS: 5.0000 mg | ORAL_TABLET | Freq: Three times a day (TID) | ORAL | 1 refills | Status: DC | PRN

## 2018-06-20 MED ORDER — METHYLPREDNISOLONE 4 MG PO TBPK: ORAL_TABLET | ORAL | 0 refills | Status: DC

## 2018-06-20 NOTE — Progress Notes (Signed)
Patient ID: LISABETH MIAN, female   DOB: 1975/11/26, 43 y.o.   MRN: [id number]    Virtual Visit via video Note  This visit type was conducted due to national recommendations for restrictions regarding the COVID-19 pandemic (e.g. social distancing).  This format is felt to be most appropriate for this patient at this time.  All issues noted in this document were discussed and addressed.  No physical exam was performed (except for noted visual exam findings with Video Visits).   I connected with Osvaldo Human today at  8:40 AM EDT by a video enabled telemedicine application and verified that I am speaking with the correct person using two identifiers. Location patient: home Location provider: [facility] Persons participating in the virtual visit: patient, provider  I discussed the limitations, risks, security and privacy concerns of performing an evaluation and management service by video and the availability of in person appointments. I also discussed with the patient that there may be a patient responsible charge related to this service. The patient expressed understanding and agreed to proceed.    HPI:  Patient and I connected via video due to right-sided low back pain and muscle spasms.  Patient states back began hurting at the end of last week.  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  Thinks she may have tweaked back from lifting something at a wrong angle.  Denies numbness or tingling in lower extremities.  Denies saddle anesthesia.  Denies loss of bowel or bladder control.  States when she turns a certain way or takes in a deep breath at times the pain will catch in the right low back.  She has taken some Advil and used ice with minimal help in reducing symptoms.  No fever or chills.  No nausea, vomiting or diarrhea.  No shortness of breath or wheezing.  No chest pain.  No urinary issues.  ROS: See  pertinent positives and negatives per HPI.  Past Medical History:  Diagnosis Date  . Anxiety   . Fatigue   . Low back pain   . Migraine   . Miscarriage     Past Surgical History:  Procedure Laterality Date  . BREAST CYST EXCISION Right 2003   benign     Family History  Problem Relation Age of Onset  . Lung cancer Father 94  . Colon cancer Father 64  . Cancer Father        lung and colon cancer age 56  . Arthritis Mother   . Diabetes Mother   . Hyperlipidemia Mother   . Hypertension Mother     Social History   Tobacco Use  . Smoking status: Never Smoker  . Smokeless tobacco: Never Used  Substance Use Topics  . Alcohol use: No    Current Outpatient Medications:  .  norgestimate-ethinyl estradiol (ORTHO-CYCLEN,SPRINTEC,PREVIFEM) 0.25-35 MG-MCG tablet, Take by mouth., Disp: , Rfl:  .  ondansetron (ZOFRAN-ODT) 8 MG disintegrating tablet, Take by mouth., Disp: , Rfl:  .  SUMAtriptan (IMITREX) 25 MG tablet, Take by mouth., Disp: , Rfl:   EXAM:  GENERAL: alert, oriented, appears well and in no acute distress  HEENT: atraumatic, conjunttiva clear, no obvious abnormalities on inspection of external nose and ears  NECK: normal movements of the head and neck  LUNGS: on inspection no signs of respiratory distress, breathing rate appears normal, no obvious gross SOB, gasping or wheezing  CV: no obvious cyanosis  MS: moves all visible extremities without noticeable abnormality  PSYCH/NEURO: pleasant and cooperative, no obvious depression or anxiety, speech and thought processing grossly intact  ASSESSMENT AND PLAN:  Discussed the following assessment and plan:  Right-sided low back pain/muscle spasms of back- patient appears to be in no acute distress or video chat.  Suspect patient did pulled muscle in back causing pain.  She will do oral steroid taper use muscle relaxers and do stretching and range of motion exercises to reduce pain.  Also discussed topical rubs such  as BenGay or Biofreeze to can be helpful in reducing pain as well as using a heating pad on low back when able to sit and rest.  Advised if pain is not improving in the next 1 to 2 weeks to call office to let us know because at that time we can consider referral to physical therapy and/or some imaging.  Patient given out of work note if needed for the end of this week to allow her time to rest and recover if she is not feeling better by the time her neck shift arrives.   I discussed the assessment and treatment plan with the patient. The patient was provided an opportunity to ask questions and all were answered. The patient agreed with the plan and demonstrated an understanding of the instructions.   The patient was advised to call back or seek an in-person evaluation if the symptoms worsen or if the condition fails to improve as anticipated.   Jodelle Green, FNP

## 2018-06-24 ENCOUNTER — Other Ambulatory Visit: Payer: Self-pay | Admitting: Internal Medicine

## 2018-06-24 ENCOUNTER — Other Ambulatory Visit: Payer: Self-pay

## 2018-06-24 ENCOUNTER — Encounter: Payer: Self-pay | Admitting: Internal Medicine

## 2018-06-24 ENCOUNTER — Ambulatory Visit
Admission: RE | Admit: 2018-06-24 | Discharge: 2018-06-24 | Disposition: A | Discharge status: Home / Self Care | Payer: 59 | Source: Ambulatory Visit | Attending: Internal Medicine | Admitting: Internal Medicine

## 2018-06-24 ENCOUNTER — Other Ambulatory Visit
Admission: RE | Admit: 2018-06-24 | Discharge: 2018-06-24 | Disposition: A | Discharge status: Home / Self Care | Payer: 59 | Source: Ambulatory Visit | Attending: Internal Medicine | Admitting: Internal Medicine

## 2018-06-24 ENCOUNTER — Ambulatory Visit (INDEPENDENT_AMBULATORY_CARE_PROVIDER_SITE_OTHER): Payer: 59 | Admitting: Internal Medicine

## 2018-06-24 DIAGNOSIS — M545 Low back pain, unspecified: Secondary | ICD-10-CM

## 2018-06-24 DIAGNOSIS — G8929 Other chronic pain: Secondary | ICD-10-CM

## 2018-06-24 DIAGNOSIS — Z1231 Encounter for screening mammogram for malignant neoplasm of breast: Secondary | ICD-10-CM

## 2018-06-24 DIAGNOSIS — D72829 Elevated white blood cell count, unspecified: Secondary | ICD-10-CM

## 2018-06-24 DIAGNOSIS — R109 Unspecified abdominal pain: Secondary | ICD-10-CM | POA: Diagnosis not present

## 2018-06-24 DIAGNOSIS — Z13220 Encounter for screening for lipoid disorders: Secondary | ICD-10-CM | POA: Diagnosis not present

## 2018-06-24 DIAGNOSIS — N83201 Unspecified ovarian cyst, right side: Secondary | ICD-10-CM | POA: Insufficient documentation

## 2018-06-24 LAB — URINALYSIS, ROUTINE W REFLEX MICROSCOPIC
Bilirubin Urine: NEGATIVE
Glucose, UA: NEGATIVE mg/dL
Ketones, ur: NEGATIVE mg/dL
Nitrite: NEGATIVE
Protein, ur: NEGATIVE mg/dL
Specific Gravity, Urine: 1.019 (ref 1.005–1.030)
pH: 7 (ref 5.0–8.0)

## 2018-06-24 LAB — CBC WITH DIFFERENTIAL/PLATELET
Abs Immature Granulocytes: 0.08 10*3/uL — ABNORMAL HIGH (ref 0.00–0.07)
Basophils Absolute: 0.1 10*3/uL (ref 0.0–0.1)
Basophils Relative: 1 %
Eosinophils Absolute: 0 10*3/uL (ref 0.0–0.5)
Eosinophils Relative: 0 %
HCT: 40 % (ref 36.0–46.0)
Hemoglobin: 13.9 g/dL (ref 12.0–15.0)
Immature Granulocytes: 1 %
Lymphocytes Relative: 11 %
Lymphs Abs: 1.5 10*3/uL (ref 0.7–4.0)
MCH: 31.5 pg (ref 26.0–34.0)
MCHC: 34.8 g/dL (ref 30.0–36.0)
MCV: 90.7 fL (ref 80.0–100.0)
Monocytes Absolute: 0.6 10*3/uL (ref 0.1–1.0)
Monocytes Relative: 4 %
Neutro Abs: 11.8 10*3/uL — ABNORMAL HIGH (ref 1.7–7.7)
Neutrophils Relative %: 83 %
Platelets: 356 10*3/uL (ref 150–400)
RBC: 4.41 MIL/uL (ref 3.87–5.11)
RDW: 12 % (ref 11.5–15.5)
WBC: 14 10*3/uL — ABNORMAL HIGH (ref 4.0–10.5)
nRBC: 0 % (ref 0.0–0.2)

## 2018-06-24 LAB — COMPREHENSIVE METABOLIC PANEL
ALT: 20 U/L (ref 0–44)
AST: 18 U/L (ref 15–41)
Albumin: 4.4 g/dL (ref 3.5–5.0)
Alkaline Phosphatase: 40 U/L (ref 38–126)
Anion gap: 11 (ref 5–15)
BUN: 13 mg/dL (ref 6–20)
CO2: 25 mmol/L (ref 22–32)
Calcium: 9.6 mg/dL (ref 8.9–10.3)
Chloride: 99 mmol/L (ref 98–111)
Creatinine, Ser: 0.6 mg/dL (ref 0.44–1.00)
GFR calc Af Amer: >60 mL/min (ref >60)
GFR calc non Af Amer: >60 mL/min (ref >60)
Glucose, Bld: 118 mg/dL — ABNORMAL HIGH (ref 70–99)
Potassium: 4.5 mmol/L (ref 3.5–5.1)
Sodium: 135 mmol/L (ref 135–145)
Total Bilirubin: 0.5 mg/dL (ref 0.3–1.2)
Total Protein: 8.3 g/dL — ABNORMAL HIGH (ref 6.5–8.1)

## 2018-06-24 LAB — PREGNANCY, URINE: Preg Test, Ur: NEGATIVE

## 2018-06-24 MED ORDER — IOHEXOL 300 MG/ML  SOLN
100.0000 mL | Freq: Once | INTRAMUSCULAR | Status: AC | PRN
  Administered 2018-06-24: 100 mL via INTRAVENOUS

## 2018-06-24 MED ORDER — TRAMADOL HCL 50 MG PO TABS: 50.0000 mg | ORAL_TABLET | Freq: Every evening | ORAL | 0 refills | Status: AC | PRN

## 2018-06-24 NOTE — Progress Notes (Signed)
Telephone  I connected with Monica Kaiser   on 06/24/18 at  3:45 PM EDT by telephone and verified that I am speaking with the correct person using two identifiers.  Location patient: home Location provider:work  Persons participating in the virtual visit: patient, provider  I discussed the limitations of evaluation and management by telemedicine and the availability of in person appointments. The patient expressed understanding and agreed to proceed.   HPI: 1. Worsening/intensified since Wednesday of last week right lower back pain x 1 week w/o radiation and worse Weds/Thurs. She was seen 06/20/18 by NP Guse and given medrol dose pk and flexeril w/o relief. Pain is severe in right lower back and worse with taking a deep breath. She has been able to work but recently missed work due to the pain.  She denies abdominal pain/n/v. Pain is 8/10 currently today worse with deep breath feels sharp/stabbing. LMP 2 weeks ago. She has tried otc pain meds w/o relief I.e ibuprofen and flexeril does not seem to be helping. Xray of low back 11/26/17 reviewed and negative    ROS: See pertinent positives and negatives per HPI.  Past Medical History:  Diagnosis Date  . Anxiety   . Fatigue   . Low back pain   . Migraine   . Miscarriage     Past Surgical History:  Procedure Laterality Date  . BREAST CYST EXCISION Right 2003   benign     Family History  Problem Relation Age of Onset  . Lung cancer Father 63  . Colon cancer Father 70  . Cancer Father        lung and colon cancer age 64  . Arthritis Mother   . Diabetes Mother   . Hyperlipidemia Mother   . 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  cyclobenzaprine (FLEXERIL) 5 MG tablet, Take 1 tablet (5 mg total) by mouth 3 (three) times daily as needed for muscle spasms., Disp: 30 tablet, Rfl: 1 .  methylPREDNISolone (MEDROL DOSEPAK) 4  MG TBPK tablet, Take according to pack instructions, Disp: 21 tablet, Rfl: 0 .  norgestimate-ethinyl estradiol (ORTHO-CYCLEN,SPRINTEC,PREVIFEM) 0.25-35 MG-MCG tablet, Take by mouth., Disp: , Rfl:  .  ondansetron (ZOFRAN-ODT) 8 MG disintegrating tablet, Take by mouth., Disp: , Rfl:  .  SUMAtriptan (IMITREX) 25 MG tablet, Take by mouth., Disp: , Rfl:  .  traMADol (ULTRAM) 50 MG tablet, Take 1 tablet (50 mg total) by mouth at bedtime as needed for up to 10 days., Disp: 10 tablet, Rfl: 0  EXAM:  VITALS per patient if applicable:  GENERAL: alert, oriented, appears well and in no acute distress  PSYCH/NEURO: pleasant and cooperative, no obvious depression or anxiety, speech and thought processing grossly intact  ASSESSMENT AND PLAN:  Discussed the following assessment and plan:  Chronic right-sided low back pain without sciatica ddx ruptured ovarian cyst/endometriosis, MSK (I.e herniated disc) r/o appendix vs pyelonephritis vs other - Plan: traMADol (ULTRAM) 50 MG tablet qhs prn, Comprehensive metabolic panel, CBC with Differential/Platelet, Urinalysis, Routine w reflex microscopic, Urine Culture, Pregnancy, urine CT ab/pelvis with contrast  If w/u negative consider MRI lumbar and PT  ? Cyst of right ovary - Plan: traMADol (ULTRAM) 50 MG tablet,  CT ab/pelvis with contrast   Leukocytosis, unspecified type - Plan: CBC with Differential/Platelet  HM=bill physical at f/u before 10/2018  Flu shot  had 09/23/17  Tdap to think about   OB/GYN Dr. Leonides Schanz pap 03/15/17 neg pap neg HPV mammo 05/26/17 neg referred for repeat  Colonoscopy due age 89 y.o FH dad dx'ed 44 will start routine screening q5 years age 79 y.o  D3 2000 IU qd and calcium 600 mg 2x per day Declines STD testing   Lipid TC 163, TG 166, HDL 45.3, LDL 85 had 03/26/17  -ordered repeat [facility]    I discussed the assessment and treatment plan with the patient. The patient was provided an opportunity to ask questions and all were answered.  The patient agreed with the plan and demonstrated an understanding of the instructions.   The patient was advised to call back or seek an in-person evaluation if the symptoms worsen or if the condition fails to improve as anticipated.  Time spent 25 minutes  Delorise Jackson, MD

## 2018-06-26 LAB — URINE CULTURE: Culture: NO GROWTH

## 2018-06-27 ENCOUNTER — Telehealth: Payer: Self-pay | Admitting: Internal Medicine

## 2018-06-27 NOTE — Telephone Encounter (Signed)
Phone call to patient- lab results given.    Patient continues to have back pain.  She is questioning whether she needs treatment for blood and bacteria in her urine.  Also she would to discuss being out work Thursday, Friday, and Saturday.  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  She states Dr. Terese Door wanted her to let her know how she was doing, and a MRI might be ordered if she continued to have back pain.  She requests call back from Dr. Terese Door.

## 2018-06-27 NOTE — Telephone Encounter (Signed)
Patient requesting call back from [place] to further discuss results.

## 2018-06-27 NOTE — Telephone Encounter (Signed)
Pt would like a Nurse to call back with labs  Cb# [phone]  Copied from [place] [id number]. Topic: Quick Communication - Lab Results (Clinic Use ONLY) >> Jun 27, 2018  9:49 AM Babs Bertin, CMA wrote: Called patient to inform them of [id number] lab results. When patient returns call, triage nurse may disclose results.

## 2018-06-28 ENCOUNTER — Encounter: Payer: Self-pay | Admitting: Internal Medicine

## 2018-06-28 NOTE — Telephone Encounter (Signed)
Work note is in Does she want MRI low back?  Physical therapy?   Review CT report 06/24/18  No need for antibiotics no UTI   TMS

## 2018-06-28 NOTE — Telephone Encounter (Signed)
Patient stated she will send mychart after checking with insurance.

## 2018-07-04 DIAGNOSIS — Z3041 Encounter for surveillance of contraceptive pills: Secondary | ICD-10-CM | POA: Diagnosis not present

## 2018-07-04 DIAGNOSIS — Z124 Encounter for screening for malignant neoplasm of cervix: Secondary | ICD-10-CM | POA: Diagnosis not present

## 2018-07-04 DIAGNOSIS — Z01419 Encounter for gynecological examination (general) (routine) without abnormal findings: Secondary | ICD-10-CM | POA: Diagnosis not present

## 2018-07-04 DIAGNOSIS — Z1151 Encounter for screening for human papillomavirus (HPV): Secondary | ICD-10-CM | POA: Diagnosis not present

## 2018-08-03 DIAGNOSIS — H5203 Hypermetropia, bilateral: Secondary | ICD-10-CM | POA: Diagnosis not present

## 2018-08-03 DIAGNOSIS — H52221 Regular astigmatism, right eye: Secondary | ICD-10-CM | POA: Diagnosis not present

## 2018-08-12 ENCOUNTER — Ambulatory Visit
Admission: RE | Admit: 2018-08-12 | Discharge: 2018-08-12 | Disposition: A | Discharge status: Home / Self Care | Payer: 59 | Source: Ambulatory Visit | Attending: Internal Medicine | Admitting: Internal Medicine

## 2018-08-12 ENCOUNTER — Other Ambulatory Visit: Payer: Self-pay

## 2018-08-12 DIAGNOSIS — Z1231 Encounter for screening mammogram for malignant neoplasm of breast: Secondary | ICD-10-CM | POA: Insufficient documentation

## 2018-09-28 ENCOUNTER — Other Ambulatory Visit: Payer: Self-pay

## 2018-09-28 ENCOUNTER — Other Ambulatory Visit: Payer: Self-pay | Admitting: Internal Medicine

## 2018-09-28 ENCOUNTER — Other Ambulatory Visit: Payer: 59

## 2018-09-28 ENCOUNTER — Telehealth: Payer: Self-pay | Admitting: *Deleted

## 2018-09-28 DIAGNOSIS — R739 Hyperglycemia, unspecified: Secondary | ICD-10-CM

## 2018-09-28 DIAGNOSIS — Z13220 Encounter for screening for lipoid disorders: Secondary | ICD-10-CM

## 2018-09-28 DIAGNOSIS — R319 Hematuria, unspecified: Secondary | ICD-10-CM

## 2018-09-28 DIAGNOSIS — D72829 Elevated white blood cell count, unspecified: Secondary | ICD-10-CM

## 2018-09-28 NOTE — Telephone Encounter (Signed)
Per note below, pt wants to have labs drawn at the hospital, not here. Please let Fransisco Beau know once labs have been corrected. Thanks

## 2018-09-28 NOTE — Telephone Encounter (Signed)
Pt came in for labs this morning & there was only a lipid. Pt has a CPE next wed at 8:30. She wants more than just a lipid. 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  Pt would also like a call once they are in.

## 2018-09-28 NOTE — Telephone Encounter (Signed)
Had no idea pt was coming in for labs  Orders in   [place]

## 2018-09-28 NOTE — Addendum Note (Signed)
Addended by: Leeanne Rio on: 09/28/2018 08:30 AM   Modules accepted: Orders

## 2018-09-28 NOTE — Addendum Note (Signed)
Addended by: Leeanne Rio on: 09/28/2018 08:35 AM   Modules accepted: Orders

## 2018-09-29 ENCOUNTER — Other Ambulatory Visit: Payer: Self-pay | Admitting: Internal Medicine

## 2018-09-29 DIAGNOSIS — Z13220 Encounter for screening for lipoid disorders: Secondary | ICD-10-CM

## 2018-09-29 DIAGNOSIS — N3000 Acute cystitis without hematuria: Secondary | ICD-10-CM

## 2018-09-29 DIAGNOSIS — R319 Hematuria, unspecified: Secondary | ICD-10-CM

## 2018-09-29 DIAGNOSIS — Z0000 Encounter for general adult medical examination without abnormal findings: Secondary | ICD-10-CM

## 2018-09-29 DIAGNOSIS — R739 Hyperglycemia, unspecified: Secondary | ICD-10-CM

## 2018-09-29 NOTE — Telephone Encounter (Signed)
Labs in for [facility]  Call pt

## 2018-10-04 ENCOUNTER — Telehealth: Payer: Self-pay

## 2018-10-04 NOTE — Telephone Encounter (Signed)
Copied from [place] [phone]. Topic: Appointment Scheduling - Scheduling Inquiry for Clinic >> Oct 04, 2018  1:31 PM Reyne Dumas L wrote: Reason for CRM:   Pt returning call to do screening questions for appointment tomorrow.  Called office, no answer.

## 2018-10-05 ENCOUNTER — Ambulatory Visit (INDEPENDENT_AMBULATORY_CARE_PROVIDER_SITE_OTHER): Payer: 59 | Admitting: Internal Medicine

## 2018-10-05 ENCOUNTER — Other Ambulatory Visit: Payer: Self-pay

## 2018-10-05 VITALS — BP 118/68 | HR 71 | Temp 97.2°F | Ht 66.0 in | Wt 142.8 lb

## 2018-10-05 DIAGNOSIS — Z13220 Encounter for screening for lipoid disorders: Secondary | ICD-10-CM | POA: Diagnosis not present

## 2018-10-05 DIAGNOSIS — D72829 Elevated white blood cell count, unspecified: Secondary | ICD-10-CM

## 2018-10-05 DIAGNOSIS — M5441 Lumbago with sciatica, right side: Secondary | ICD-10-CM | POA: Diagnosis not present

## 2018-10-05 DIAGNOSIS — R319 Hematuria, unspecified: Secondary | ICD-10-CM

## 2018-10-05 DIAGNOSIS — Z0000 Encounter for general adult medical examination without abnormal findings: Secondary | ICD-10-CM

## 2018-10-05 DIAGNOSIS — R202 Paresthesia of skin: Secondary | ICD-10-CM | POA: Insufficient documentation

## 2018-10-05 DIAGNOSIS — G43909 Migraine, unspecified, not intractable, without status migrainosus: Secondary | ICD-10-CM | POA: Diagnosis not present

## 2018-10-05 DIAGNOSIS — G8929 Other chronic pain: Secondary | ICD-10-CM

## 2018-10-05 DIAGNOSIS — M5442 Lumbago with sciatica, left side: Secondary | ICD-10-CM

## 2018-10-05 DIAGNOSIS — M542 Cervicalgia: Secondary | ICD-10-CM | POA: Diagnosis not present

## 2018-10-05 DIAGNOSIS — R739 Hyperglycemia, unspecified: Secondary | ICD-10-CM

## 2018-10-05 LAB — COMPREHENSIVE METABOLIC PANEL
ALT: 22 U/L (ref 0–35)
AST: 15 U/L (ref 0–37)
Albumin: 4.6 g/dL (ref 3.5–5.2)
Alkaline Phosphatase: 43 U/L (ref 39–117)
BUN: 11 mg/dL (ref 6–23)
CO2: 27 mEq/L (ref 19–32)
Calcium: 9.8 mg/dL (ref 8.4–10.5)
Chloride: 101 mEq/L (ref 96–112)
Creatinine, Ser: 0.54 mg/dL (ref 0.40–1.20)
GFR: 123.73 mL/min (ref >60.00)
Glucose, Bld: 91 mg/dL (ref 70–99)
Potassium: 4.3 mEq/L (ref 3.5–5.1)
Sodium: 137 mEq/L (ref 135–145)
Total Bilirubin: 0.6 mg/dL (ref 0.2–1.2)
Total Protein: 7.4 g/dL (ref 6.0–8.3)

## 2018-10-05 LAB — CBC WITH DIFFERENTIAL/PLATELET
Basophils Absolute: 0.1 10*3/uL (ref 0.0–0.1)
Basophils Relative: 0.9 % (ref 0.0–3.0)
Eosinophils Absolute: 0 10*3/uL (ref 0.0–0.7)
Eosinophils Relative: 0.4 % (ref 0.0–5.0)
HCT: 39.9 % (ref 36.0–46.0)
Hemoglobin: 13.6 g/dL (ref 12.0–15.0)
Lymphocytes Relative: 18.2 % (ref 12.0–46.0)
Lymphs Abs: 1.4 10*3/uL (ref 0.7–4.0)
MCHC: 34 g/dL (ref 30.0–36.0)
MCV: 92.7 fl (ref 78.0–100.0)
Monocytes Absolute: 0.5 10*3/uL (ref 0.1–1.0)
Monocytes Relative: 6.2 % (ref 3.0–12.0)
Neutro Abs: 5.7 10*3/uL (ref 1.4–7.7)
Neutrophils Relative %: 74.3 % (ref 43.0–77.0)
Platelets: 343 10*3/uL (ref 150.0–400.0)
RBC: 4.31 Mil/uL (ref 3.87–5.11)
RDW: 12.8 % (ref 11.5–15.5)
WBC: 7.6 10*3/uL (ref 4.0–10.5)

## 2018-10-05 LAB — LIPID PANEL
Cholesterol: 192 mg/dL (ref 0–200)
HDL: 55.9 mg/dL (ref >39.00)
LDL Cholesterol: 105 mg/dL — ABNORMAL HIGH (ref 0–99)
NonHDL: 136.37
Total CHOL/HDL Ratio: 3
Triglycerides: 159 mg/dL — ABNORMAL HIGH (ref 0.0–149.0)
VLDL: 31.8 mg/dL (ref 0.0–40.0)

## 2018-10-05 LAB — HEMOGLOBIN A1C: Hgb A1c MFr Bld: 5.5 % (ref 4.6–6.5)

## 2018-10-05 MED ORDER — SUMATRIPTAN SUCCINATE 25 MG PO TABS: 25.0000 mg | ORAL_TABLET | Freq: Every day | ORAL | 2 refills | Status: DC | PRN

## 2018-10-05 MED ORDER — ONDANSETRON 8 MG PO TBDP: 8.0000 mg | ORAL_TABLET | Freq: Three times a day (TID) | ORAL | 5 refills | Status: DC | PRN

## 2018-10-05 NOTE — Patient Instructions (Addendum)
Check Tdap vaccine check with employee heath   [employer] organic multivitamin [employer]  Petra Kuba made or centrum    Paresthesia Paresthesia is an abnormal burning or prickling sensation. It is usually felt in the hands, arms, legs, or feet. However, it may occur in any part of the body. Usually, paresthesia is not painful. It may feel like:  Tingling or numbness.  Buzzing.  Itching. Paresthesia may occur without any clear cause, or it may be caused by:  Breathing too quickly (hyperventilation).  Pressure on a nerve.  An underlying medical condition.  Side effects of a medication.  Nutritional deficiencies.  Exposure to toxic chemicals. Most people experience temporary (transient) paresthesia at some time in their lives. For some people, it may be long-lasting (chronic) because of an underlying medical condition. If you have paresthesia that lasts a long time, you may need to be evaluated by your health care provider. Follow these instructions at home: Alcohol use   Do not drink alcohol if: ? Your health care provider tells you not to drink. ? You are pregnant, may be pregnant, or are planning to become pregnant.  If you drink alcohol: ? Limit how much you use to:  0-1 drink a day for women.  0-2 drinks a day for men. ? Be aware of how much alcohol is in your drink. In the [place] one drink equals one 12 oz bottle of beer (355 mL), one 5 oz glass of wine (148 mL), or one 1 oz glass of hard liquor (44 mL). Nutrition   Eat a healthy diet. This includes: ? Eating foods that are high in fiber, such as fresh fruits and vegetables, whole grains, and beans. ? Limiting foods that are high in fat and processed sugars, such as fried or sweet foods. General instructions  Take over-the-counter and prescription medicines only as told by your health care provider.  Do not use any products that contain nicotine or tobacco, such as cigarettes and e-cigarettes. These can keep  blood from reaching damaged nerves. If you need help quitting, ask your health care provider.  If you have diabetes, work closely with your health care provider to keep your blood sugar under control.  If you have numbness in your feet: ? Check every day for signs of injury or infection. Watch for redness, warmth, and swelling. ? Wear padded socks and comfortable shoes. These help protect your feet.  Keep all follow-up visits as told by your health care provider. This is important. Contact a health care provider if you:  Have paresthesia that gets worse or does not go away.  Have a burning or prickling feeling that gets worse when you walk.  Have pain, cramps, or dizziness.  Develop a rash. Get help right away if you:  Feel weak.  Have trouble walking or moving.  Have problems with speech, understanding, or vision.  Feel confused.  Cannot control your bladder or bowel movements.  Have numbness after an injury.  Develop new weakness in an arm or leg.  Faint. Summary  Paresthesia is an abnormal burning or prickling sensation that is usually felt in the hands, arms, legs, or feet. It may also occur in other parts of the body.  Paresthesia may occur without any clear cause, or it may be caused by breathing too quickly (hyperventilation), pressure on a nerve, an underlying medical condition, side effects of a medication, nutritional deficiencies, or exposure to toxic chemicals.  If you have paresthesia that lasts a long  time, you may need to be evaluated by your health care provider. This information is not intended to replace advice given to you by your health care provider. Make sure you discuss any questions you have with your health care provider. Document Released: 12/12/2001 Document Revised: 01/17/2018 Document Reviewed: 12/31/2016 Elsevier Patient Education  2020 [employer].

## 2018-10-05 NOTE — Progress Notes (Signed)
Chief Complaint  Patient presents with  . Follow-up   Annual  1. Migraines f/u with Dr. Manuella Ghazi wants refills zofran and imitrex until can see him  2. R/o RUE numbness and tingling and neck pain sometimes but 0/10 today x 1 week B12 03/26/17 normal 402 and 04/11/15 317  3. Chronic low back pain Xray 11/2017 negative   Review of Systems  Constitutional: Negative for weight loss.  Eyes: Negative for blurred vision.  Respiratory: Negative for shortness of breath.   Cardiovascular: Negative for chest pain.  Gastrointestinal: Negative for abdominal pain.  Musculoskeletal: Positive for back pain. Negative for falls.  Skin: Negative for rash.  Neurological: Positive for sensory change.  Psychiatric/Behavioral: Negative for memory loss.   Past Medical History:  Diagnosis Date  . Anxiety   . Fatigue   . Low back pain   . Migraine   . Miscarriage    Past Surgical History:  Procedure Laterality Date  . BREAST CYST EXCISION Right 2003   benign    Family History  Problem Relation Age of Onset  . Lung cancer Father 67  . Colon cancer Father 3  . Cancer Father        lung and colon cancer age 68  . Arthritis Mother   . Diabetes Mother   . Hyperlipidemia Mother   . Hypertension Mother    Social History   Socioeconomic History  . Marital status: Married    Spouse name: Not on file  . Number of children: Not on file  . Years of education: Not on file  . Highest education level: Not on file  Occupational History  . Not on file  Social Needs  . Financial resource strain: Not on file  . Food insecurity    Worry: Not on file    Inability: Not on file  . Transportation needs    Medical: Not on file    Non-medical: Not on file  Tobacco Use  . Smoking status: Never Smoker  . Smokeless tobacco: Never Used  Substance and Sexual Activity  . Alcohol use: No  . Drug use: No  . Sexual activity: Yes  Lifestyle  . Physical activity    Days per week: Not on file    Minutes per  session: Not on file  . Stress: Not on file  Relationships  . Social Herbalist on phone: Not on file    Gets together: Not on file    Attends religious service: Not on file    Active member of club or organization: Not on file    Attends meetings of clubs or organizations: Not on file    Relationship status: Not on file  . Intimate partner violence    Fear of current or ex partner: Not on file    Emotionally abused: Not on file    Physically abused: Not on file    Forced sexual activity: Not on file  Other Topics Concern  . 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 norgestimate-ethinyl estradiol (ORTHO-CYCLEN,SPRINTEC,PREVIFEM) 0.25-35 MG-MCG tablet Take by mouth.  . ondansetron (ZOFRAN-ODT) 8 MG disintegrating tablet Take 1 tablet (8 mg total) by mouth every 8 (eight) hours as needed for nausea or vomiting.  . SUMAtriptan (IMITREX) 25 MG tablet Take 1  tablet (25 mg total) by mouth daily as needed for migraine. Daily prn repeat in 2 hrs if needed. Max dose in 1 day 200 mg total  . [DISCONTINUED] ondansetron (ZOFRAN-ODT) 8 MG disintegrating tablet Take by mouth.  . [DISCONTINUED] SUMAtriptan (IMITREX) 25 MG tablet Take by mouth.   No Known Allergies No results found for this or any previous visit (from the past 2160 hour(s)). Objective  Body mass index is 23.05 kg/m. Wt Readings from Last 3 Encounters:  10/05/18 142 lb 12.8 oz (64.8 kg)  11/26/17 141 lb 3.2 oz (64 kg)  11/15/17 138 lb (62.6 kg)   Temp Readings from Last 3 Encounters:  10/05/18 (!) 97.2 F (36.2 C) (Oral)  11/26/17 98.7 F (37.1 C) (Oral)  11/15/17 98.4 F (36.9 C) (Oral)   BP Readings from Last 3 Encounters:  10/05/18 118/68  11/26/17 120/80  11/15/17 118/76   Pulse Readings from Last 3 Encounters:  10/05/18 71  11/26/17 60  11/15/17 68    Physical Exam Vitals signs  and nursing note reviewed.  Constitutional:      Appearance: Normal appearance. She is well-developed and well-groomed.  HENT:     Head: Normocephalic and atraumatic.     Nose:     Comments: +mask on   Eyes:     Conjunctiva/sclera: Conjunctivae normal.     Pupils: Pupils are equal, round, and reactive to light.  Cardiovascular:     Rate and Rhythm: Normal rate and regular rhythm.     Heart sounds: Normal heart sounds. No murmur.  Pulmonary:     Effort: Pulmonary effort is normal.     Breath sounds: Normal breath sounds.  Abdominal:     Tenderness: There is no abdominal tenderness.  Neurological:     General: No focal deficit present.     Mental Status: She is alert and oriented to person, place, and time. Mental status is at baseline.     Gait: Gait normal.  Psychiatric:        Attention and Perception: Attention and perception normal.        Mood and Affect: Mood and affect normal.        Speech: Speech normal.        Behavior: Behavior normal. Behavior is cooperative.        Thought Content: Thought content normal.        Cognition and Memory: Cognition and memory normal.        Judgment: Judgment normal.     Assessment  Plan  Annual physical exam -  Fasting labs today   Hematuria, unspecified type - Plan: Urinalysis, Routine w reflex microscopic, Urine Culture, CANCELED: Urinalysis, Routine w reflex microscopic, CANCELED: Urinalysis, Routine w reflex microscopic  Tingling of right upper extremity - Plan: Comprehensive metabolic panel, CBC with Differential/Platelet F/u Dr. Manuella Ghazi likely needs NCS/EMG  Migraine without status migrainosus, not intractable, unspecified migraine type - Plan: SUMAtriptan (IMITREX) 25 MG tablet, ondansetron (ZOFRAN-ODT) 8 MG disintegrating tablet  Leukocytosis, unspecified type - Plan: CBC with Differential/Platelet  -if still elevated refer Dr. Tasia Catchings   Chronic low back pain Wants to hold on MRI low back for now   HM Flu shot had 09/23/17  will get at work 10/2018  Tdap to think about check with employee healthy date   OB/GYN Dr. Leonides Schanz pap3/11/19 neg pap neg HPV mammo8/7/20 neg Colonoscopy due age 58 y.[place] dad dx'ed 48 will start routine screening q5 years age 11 y.o  D3 5000 IU qd and  calcium 600 mg 2x per day also taking vitamin C, D biotin and E   Declines STD testing  rec f/u dermatology for 2020 on [facility] c/w mole on right face established   Provider: Dr. Olivia Mackie McLean-Scocuzza-Internal Medicine

## 2018-10-06 ENCOUNTER — Encounter: Payer: Self-pay | Admitting: Internal Medicine

## 2018-10-07 LAB — URINE CULTURE
MICRO NUMBER:: 939391
Result:: NO GROWTH
SPECIMEN QUALITY:: ADEQUATE

## 2018-10-07 LAB — URINALYSIS, ROUTINE W REFLEX MICROSCOPIC
Bilirubin Urine: NEGATIVE
Glucose, UA: NEGATIVE
Hgb urine dipstick: NEGATIVE
Ketones, ur: NEGATIVE
Leukocytes,Ua: NEGATIVE
Nitrite: NEGATIVE
Protein, ur: NEGATIVE
Specific Gravity, Urine: 1.012 (ref 1.001–1.03)
pH: 6.5 (ref 5.0–8.0)

## 2018-12-12 ENCOUNTER — Ambulatory Visit: Payer: Self-pay | Admitting: *Deleted

## 2018-12-12 NOTE — Telephone Encounter (Signed)
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 If not and passes questions can come in office  [place]

## 2018-12-12 NOTE — Telephone Encounter (Signed)
Monica Kaiser   Anything like this there is no advice but  1. schedule an appt or go to the ED or urgent care She needs an EKG  [place]

## 2018-12-12 NOTE — Telephone Encounter (Signed)
Pt called in c/o her heart rate being elevated when she is at work.   She is a Marine scientist in the hospital.    "My Apple watch shows my HR to be 120-160 and when I check my pulse with a Dynamap at work my HR is elevated.   She denies any other symptoms.   "It could be just stress since it only happens at work but I just want to be checked out to be sure I'm ok".  I went over the care advice and s/s to watch for and go to the ED if they occur.   She was agreeable.    She is already doing much of the care advice when I went over that with her.  I warm transferred her call into Dr. McLean-Scocuzza's office to [facility] to be scheduled.  I sent my notes to the office.   Reason for Disposition . [1] Palpitations AND [2] no improvement after using CARE ADVICE  Answer Assessment - Initial Assessment Questions 1. DESCRIPTION: "Please describe your heart rate or heart beat that you are having" (e.g., fast/slow, regular/irregular, skipped or extra beats, "palpitations")     My Apple watch shows my heart rate is high when I'm at work.  I feel like my heart is racing.  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    Sometimes on my Apple watch my HR 120s-160s.   Yesterday 130s-140s. 2. ONSET: "When did it start?" (Minutes, hours or days)      About a week ago.       3. DURATION: "How long does it last" (e.g., seconds, minutes, hours)     I notice it at work.    4. PATTERN "Does it come and go, or has it been constant since it started?"  "Does it get worse with exertion?"   "Are you feeling it now?"     When at home my pulse is normally 80s and 90s.   5. TAP: "Using your hand, can you tap out what you are feeling on a chair or table in front of you, so that I can hear?" (Note: not all patients can do this)       Right now my watch showing 68.   6. HEART RATE: "Can you tell me your heart rate?" "How many beats in 15 seconds?"  (Note: not all patients can do this)       68-77 7. RECURRENT SYMPTOM: "Have you ever had  this before?" If so, ask: "When was the last time?" and "What happened that time?"      I don't feel it skipping beats just beating fast.     Back in the summer I had chest pain they did an EKG and it was fine.   8. CAUSE: "What do you think is causing the palpitations?"     I thinking  The stress at work maybe. 9. CARDIAC HISTORY: "Do you have any history of heart disease?" (e.g., heart attack, angina, bypass surgery, angioplasty, arrhythmia)      No   Not a smoker.   10. OTHER SYMPTOMS: "Do you have any other symptoms?" (e.g., dizziness, chest pain, sweating, difficulty breathing)       None of the above 11. PREGNANCY: "Is there any chance you are pregnant?" "When was your last menstrual period?"       No I just had my period 2 wks ago  Protocols used: [place]

## 2018-12-14 ENCOUNTER — Other Ambulatory Visit: Payer: Self-pay

## 2018-12-14 ENCOUNTER — Ambulatory Visit: Payer: 59 | Admitting: Internal Medicine

## 2018-12-15 ENCOUNTER — Ambulatory Visit: Payer: 59 | Admitting: Internal Medicine

## 2018-12-15 ENCOUNTER — Encounter: Payer: Self-pay | Admitting: Internal Medicine

## 2018-12-15 ENCOUNTER — Other Ambulatory Visit: Payer: Self-pay

## 2018-12-15 VITALS — BP 98/62 | HR 75 | Temp 96.8°F | Ht 66.0 in | Wt 139.0 lb

## 2018-12-15 DIAGNOSIS — R002 Palpitations: Secondary | ICD-10-CM

## 2018-12-15 LAB — T4, FREE: Free T4: 0.73 ng/dL (ref 0.60–1.60)

## 2018-12-15 LAB — D-DIMER, QUANTITATIVE: D-Dimer, Quant: <0.19 mcg/mL FEU (ref <0.50)

## 2018-12-15 LAB — TSH: TSH: 0.88 u[IU]/mL (ref 0.35–4.50)

## 2018-12-15 NOTE — Progress Notes (Addendum)
Progress Note HPI: 1. ST HR 54 to 140s to 194 worse x 1-2 weeks. Notices when at work no increased anxiety, only 1 cup of coffee daily. She does not drink water throughout the day at work. No fever feeling well.     ROS: See pertinent positives and negatives per HPI.  Past Medical History:  Diagnosis Date  . Anxiety   . Fatigue   . Low back pain   . Migraine   . Miscarriage     Past Surgical History:  Procedure Laterality Date  . BREAST CYST EXCISION Right 2003   benign     Family History  Problem Relation Age of Onset  . Lung cancer Father 57  . Colon cancer Father 76  . Cancer Father        lung and colon cancer age 103  . Arthritis Mother   . Diabetes Mother   . Hyperlipidemia Mother   . 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  norgestimate-ethinyl estradiol (ORTHO-CYCLEN,SPRINTEC,PREVIFEM) 0.25-35 MG-MCG tablet, Take by mouth., Disp: , Rfl:  .  ondansetron (ZOFRAN-ODT) 8 MG disintegrating tablet, Take 1 tablet (8 mg total) by mouth every 8 (eight) hours as needed for nausea or vomiting., Disp: 30 tablet, Rfl: 5 .  SUMAtriptan (IMITREX) 25 MG tablet, Take 1 tablet (25 mg total) by mouth daily as needed for migraine. Daily prn repeat in 2 hrs if needed. Max dose in 1 day 200 mg total, Disp: 10 tablet, Rfl: 2  EXAM:  VITALS per patient if applicable:  GENERAL: alert, oriented, appears well and in no acute distress  HEENT: atraumatic, conjunttiva clear, no obvious abnormalities on inspection of external nose and ears  NECK: normal movements of the head and neck  LUNGS: on inspection no signs of respiratory distress, breathing rate appears normal, no obvious gross SOB, gasping or wheezing  CV: no obvious cyanosis, NSR  MS: moves all visible extremities without noticeable abnormality  PSYCH/NEURO: pleasant and cooperative, no obvious depression or  anxiety, speech and thought processing grossly intact  ASSESSMENT AND PLAN:  Discussed the following assessment and plan:  Heart palpitations - Plan: EKG 12-Lead, TSH, T4, free, D-Dimer, Quantitative, Ambulatory referral to Cardiology likely needs Zio heart monitor  Referred to cards  [facility] EKG today  Of note this was in person visit   HM Flu shot 09/22/18 at work   Tdap 11/14/10  OB/GYN Dr. Leonides Schanz pap3/11/19 neg pap neg HPV mammo8/7/20 neg Colonoscopy due age 40 y.[place] dad dx'ed 70 will start routine screening q5 years age 77 y.o  D3 5000 IU qd and calcium 600 mg 2x per day also taking vitamin C, D biotin and E   Declines STD testing  rec f/u dermatology for 2020 on [facility] c/w mole on right face established saw 2020 rec [facility] no bx's   -we discussed possible serious and likely etiologies, options for evaluation and workup, limitations of telemedicine visit vs in person visit, treatment, treatment risks and precautions. Pt prefers to treat via telemedicine empirically rather then risking or undertaking an in person visit at this moment. Patient agrees to seek prompt in person care if worsening, new symptoms arise, or if is not improving with treatment.   I discussed the assessment and treatment plan with the patient. The patient was provided an opportunity to ask questions and all were  answered. The patient agreed with the plan and demonstrated an understanding of the instructions.   The patient was advised to call back or seek an in-person evaluation if the symptoms worsen or if the condition fails to improve as anticipated.   Monica Glow McLean-Scocuzza, MD

## 2018-12-15 NOTE — Patient Instructions (Signed)
Sinus Tachycardia  Sinus tachycardia is a kind of fast heartbeat. In sinus tachycardia, the heart beats more than 100 times a minute. Sinus tachycardia starts in a part of the heart called the sinus node. Sinus tachycardia may be harmless, or it may be a sign of a serious condition. What are the causes? This condition may be caused by:  Exercise or exertion.  A fever.  Pain.  Loss of body fluids (dehydration).  Severe bleeding (hemorrhage).  Anxiety and stress.  Certain substances, including: ? Alcohol. ? Caffeine. ? Tobacco and nicotine products. ? Cold medicines. ? Illegal drugs.  Medical conditions including: ? Heart disease. ? An infection. ? An overactive thyroid (hyperthyroidism). ? A lack of red blood cells (anemia). What are the signs or symptoms? Symptoms of this condition include:  A feeling that the heart is beating quickly (palpitations).  Suddenly noticing your heartbeat (cardiac awareness).  Dizziness.  Tiredness (fatigue).  Shortness of breath.  Chest pain.  Nausea.  Fainting. How is this diagnosed? This condition is diagnosed with:  A physical exam.  Other tests, such as: ? Blood tests. ? An electrocardiogram (ECG). This test measures the electrical activity of the heart. ? Ambulatory cardiac monitor. This records your heartbeats for 24 hours or more. You may be referred to a heart specialist (cardiologist). How is this treated? Treatment for this condition depends on the cause or the underlying condition. Treatment may involve:  Treating the underlying condition.  Taking new medicines or changing your current medicines as told by your health care provider.  Making changes to your diet or lifestyle. Follow these instructions at home: Lifestyle   Do not use any products that contain nicotine or tobacco, such as cigarettes and e-cigarettes. If you need help quitting, ask your health care provider.  Do not use illegal drugs, such as  cocaine.  Learn relaxation methods to help you when you get stressed or anxious. These include deep breathing.  Avoid caffeine or other stimulants. Alcohol use   Do not drink alcohol if: ? Your health care provider tells you not to drink. ? You are pregnant, may be pregnant, or are planning to become pregnant.  If you drink alcohol, limit how much you have: ? 0-1 drink a day for women. ? 0-2 drinks a day for men.  Be aware of how much alcohol is in your drink. In the [place] one drink equals one typical bottle of beer (12 oz), one-half glass of wine (5 oz), or one shot of hard liquor (1 oz). General instructions  Drink enough fluids to keep your urine pale yellow.  Take over-the-counter and prescription medicines only as told by your health care provider.  Keep all follow-up visits as told by your health care provider. This is important. Contact a health care provider if you have:  A fever.  Vomiting or diarrhea that does not go away. Get help right away if you:  Have pain in your chest, upper arms, jaw, or neck.  Become weak or dizzy.  Feel faint.  Have palpitations that do not go away. Summary  In sinus tachycardia, the heart beats more than 100 times a minute.  Sinus tachycardia may be harmless, or it may be a sign of a serious condition.  Treatment for this condition depends on the cause or the underlying condition.  Get help right away if you have pain in your chest, upper arms, jaw, or neck. This information is not intended to replace advice given to you by   your health care provider. Make sure you discuss any questions you have with your health care provider. Document Released: 01/30/2004 Document Revised: 02/10/2017 Document Reviewed: 02/10/2017 Elsevier Patient Education  2020 [employer].

## 2018-12-16 ENCOUNTER — Ambulatory Visit: Payer: 59

## 2018-12-16 ENCOUNTER — Ambulatory Visit (INDEPENDENT_AMBULATORY_CARE_PROVIDER_SITE_OTHER): Payer: 59 | Admitting: Cardiovascular Disease

## 2018-12-16 ENCOUNTER — Encounter: Payer: Self-pay | Admitting: Cardiovascular Disease

## 2018-12-16 VITALS — BP 104/76 | HR 72 | Ht 66.0 in | Wt 140.0 lb

## 2018-12-16 DIAGNOSIS — R002 Palpitations: Secondary | ICD-10-CM | POA: Diagnosis not present

## 2018-12-16 NOTE — Progress Notes (Signed)
Cardiology Office Note   Date:  12/16/2018   ID:  Ivania, Arnaud 01/20/1975, MRN [id number]  PCP:  McLean-Scocuzza, Nino Glow, MD  Cardiologist:   Kathlyn Sacramento, MD   Chief Complaint  Patient presents with  . other    Heart palpitations. Meds reviewed verbally with pt.      History of Present Illness: Monica Kaiser is a 43 y.o. female who was referred by Dr. Terese Door for evaluation of palpitations and tachycardia.  She is a Marine scientist at [employer].  She has no prior cardiac history and overall has been healthy throughout her life other than history of migraine.  She is a lifelong non-smoker.  She drinks alcohol socially and drinks 1 cup of coffee daily.  Family history is negative for premature coronary artery disease, arrhythmia or sudden death.  Her mother has hypertension and father died of lung cancer. She had recent episodes of intermittent tachycardia and palpitations.  She has an apple watch and her heart rate occasionally goes up to 195 bpm even if she is not over exerting herself.  There is usually significant fluctuation of heart rate.  Other than palpitations, she has minimal symptoms related to this.  No syncope or presyncope.  No chest pain or shortness of breath.  She does not exercise on a regular basis. She does not think she drinks enough water throughout the day.  She had labs in September which were unremarkable including CBC and CMP.  There was no evidence of volume depletion.  She had additional labs done yesterday which included TSH and D-dimer and both were normal.    Past Medical History:  Diagnosis Date  . Anxiety   . Fatigue   . Low back pain   . Migraine   . Miscarriage     Past Surgical History:  Procedure Laterality Date  . BREAST CYST EXCISION Right 2003   benign      Current Outpatient Medications  Medication Sig Dispense Refill  . norgestimate-ethinyl estradiol (ORTHO-CYCLEN,SPRINTEC,PREVIFEM) 0.25-35 MG-MCG tablet Take by mouth.    .  ondansetron (ZOFRAN-ODT) 8 MG disintegrating tablet Take 1 tablet (8 mg total) by mouth every 8 (eight) hours as needed for nausea or vomiting. 30 tablet 5  . SUMAtriptan (IMITREX) 25 MG tablet Take 1 tablet (25 mg total) by mouth daily as needed for migraine. Daily prn repeat in 2 hrs if needed. Max dose in 1 day 200 mg total 10 tablet 2   No current facility-administered medications for this visit.    Allergies:   Patient has no known allergies.    Social History:  The patient  reports that she has never smoked. She has never used smokeless tobacco. She reports that she does not drink alcohol or use drugs.   Family History:  The patient's family history includes Arthritis in her mother; Cancer in her father; Colon cancer (age of onset: 42) in her father; Diabetes in her mother; Hyperlipidemia in her mother; Hypertension in her mother; Lung cancer (age of onset: 61) in her father.    ROS:  Please see the history of present illness.   Otherwise, review of systems are positive for none.   All other systems are reviewed and negative.    PHYSICAL EXAM: VS:  BP 104/76 (BP Location: Right Arm, Patient Position: Sitting, Cuff Size: Normal)   Pulse 72   Ht 5\' 6"  (1.676 m)   Wt 140 lb (63.5 kg)   SpO2 98%   BMI 22.60  kg/m  , BMI Body mass index is 22.6 kg/m. GEN: Well nourished, well developed, in no acute distress  HEENT: normal  Neck: no JVD, carotid bruits, or masses Cardiac: RRR; no murmurs, rubs, or gallops,no edema  Respiratory:  clear to auscultation bilaterally, normal work of breathing GI: soft, nontender, nondistended, + BS MS: no deformity or atrophy  Skin: warm and dry, no rash Neuro:  Strength and sensation are intact Psych: euthymic mood, full affect   EKG:  EKG is ordered today. The ekg ordered today demonstrates normal sinus rhythm with no significant ST or T wave changes.  Normal PR and QT intervals   Recent Labs: 10/05/2018: ALT 22; BUN 11; Creatinine, Ser 0.54;  Hemoglobin 13.6; Platelets 343.0; Potassium 4.3; Sodium 137 12/15/2018: TSH 0.88    Lipid Panel    Component Value Date/Time   CHOL 192 10/05/2018 0905   TRIG 159.0 (H) 10/05/2018 0905   HDL 55.90 10/05/2018 0905   CHOLHDL 3 10/05/2018 0905   VLDL 31.8 10/05/2018 0905   LDLCALC 105 (H) 10/05/2018 0905      Wt Readings from Last 3 Encounters:  12/16/18 140 lb (63.5 kg)  12/15/18 139 lb (63 kg)  10/05/18 142 lb 12.8 oz (64.8 kg)      PAD Screen 12/16/2018  Previous PAD dx? No  Previous surgical procedure? No  Pain with walking? No  Feet/toe relief with dangling? No  Painful, non-healing ulcers? No  Extremities discolored? No      ASSESSMENT AND PLAN:  1.  Palpitations and tachycardia: Based on her description, I suspect sinus tachycardia or paroxysmal supraventricular tachycardia.  It is possible she might have inappropriate sinus tachycardia.  Her physical exam is normal and baseline EKG is also normal.  No need for an echocardiogram. I requested a 2-week outpatient monitor for evaluation.  I advised her to stay well-hydrated. She does not consume excessive amount of caffeine and recent thyroid function was normal. Given that her symptoms are overall mild, I doubt that she will require treatment unless the monitor shows unexpected findings.   Disposition:   FU with me as needed.  Signed,  Kathlyn Sacramento, MD  12/16/2018 1:40 PM    [place]

## 2018-12-16 NOTE — Patient Instructions (Signed)
Medication Instructions:  Your physician recommends that you continue on your current medications as directed. Please refer to the Current Medication list given to you today.  *If you need a refill on your cardiac medications before your next appointment, please call your pharmacy*  Lab Work: None ordered If you have labs (blood work) drawn today and your tests are completely normal, you will receive your results only by: Marland Kitchen MyChart Message (if you have MyChart) OR . A paper copy in the mail If you have any lab test that is abnormal or we need to change your treatment, we will call you to review the results.  Testing/Procedures: Your physician has recommended that you wear an zio monitor. Zio monitors are medical devices that record the heart's electrical activity. Doctors most often [place] these monitors to diagnose arrhythmias. Arrhythmias are problems with the speed or rhythm of the heartbeat. The monitor is a small, portable device. You can wear one while you do your normal daily activities. This is usually used to diagnose what is causing palpitations/syncope (passing out).    Follow-Up: At [facility], you and your health needs are our priority.  As part of our continuing mission to provide you with exceptional heart care, we have created designated Provider Care Teams.  These Care Teams include your primary Cardiologist (physician) and Advanced Practice Providers (APPs -  Physician Assistants and Nurse Practitioners) who all work together to provide you with the care you need, when you need it.  Your next appointment:   As needed   The format for your next appointment:   In Person  Provider:    You may see Dr. Fletcher Anon  or one of the following Advanced Practice Providers on your designated Care Team:    Murray Hodgkins, NP  Christell Faith, PA-C  Marrianne Mood, PA-C   Other Instructions Your physician has recommended that you wear a Zio monitor. This monitor is a medical device  that records the heart's electrical activity. Doctors most often use these monitors to diagnose arrhythmias. Arrhythmias are problems with the speed or rhythm of the heartbeat. The monitor is a small device applied to your chest. You can wear one while you do your normal daily activities. While wearing this monitor if you have any symptoms to push the button and record what you felt. Once you have worn this monitor for the period of time provider prescribed (Usually 14 days), you will return the monitor device in the postage paid box. Once it is returned they will download the data collected and provide [place] with a report which the provider will then review and we will call you with those results. Important tips:  1. Avoid showering during the first 24 hours of wearing the monitor. 2. Avoid excessive sweating to help maximize wear time. 3. Do not submerge the device, no hot tubs, and no swimming pools. 4. Keep any lotions or oils away from the patch. 5. After 24 hours you may shower with the patch on. Take brief showers with your back facing the shower head.  6. Do not remove patch once it has been placed because that will interrupt data and decrease adhesive wear time. 7. Push the button when you have any symptoms and write down what you were feeling. 8. Once you have completed wearing your monitor, remove and place into box which has postage paid and place in your outgoing mailbox.  9. If for some reason you have misplaced your box then call our office and  we can provide another box and/or mail it off for you.        

## 2018-12-29 ENCOUNTER — Ambulatory Visit: Payer: 59 | Admitting: Internal Medicine

## 2019-01-04 ENCOUNTER — Other Ambulatory Visit: Payer: Self-pay

## 2019-01-04 ENCOUNTER — Ambulatory Visit (INDEPENDENT_AMBULATORY_CARE_PROVIDER_SITE_OTHER): Payer: 59 | Admitting: Internal Medicine

## 2019-01-04 VITALS — Ht 66.0 in | Wt 140.0 lb

## 2019-01-04 DIAGNOSIS — U071 COVID-19: Secondary | ICD-10-CM

## 2019-01-04 NOTE — Progress Notes (Signed)
Virtual Visit via Video Note  I connected with Monica Kaiser  on 01/04/19 at  9:30 AM EST by a video enabled telemedicine application and verified that I am speaking with the correct person using two identifiers.  Location patient: home Location provider:work or home office Persons participating in the virtual visit: patient, provider  I discussed the limitations of evaluation and management by telemedicine and the availability of in person appointments. The patient expressed understanding and agreed to proceed.   HPI: COVID 19+ on 12/23/18 4 coworker had covid some the week before had mild loss of smell, nasal pressure/congestion, fatigue, reduced appetite and loss of smell but smell coming back   Palpitations way before covid 19 zio on 12/11-12/25/20 has not mailed back will call cards and let them know she tested +   ROS: See pertinent positives and negatives per HPI.  Past Medical History:  Diagnosis Date  . Anxiety   . Fatigue   . Low back pain   . Migraine   . Miscarriage     Past Surgical History:  Procedure Laterality Date  . BREAST CYST EXCISION Right 2003   benign     Family History  Problem Relation Age of Onset  . Lung cancer Father 1  . Colon cancer Father 48  . Cancer Father        lung and colon cancer age 26  . Arthritis Mother   . Diabetes Mother   . Hyperlipidemia Mother   . Hypertension Mother     SOCIAL HX:  Married with kids   Current Outpatient Medications:  .  norgestimate-ethinyl estradiol (ORTHO-CYCLEN,SPRINTEC,PREVIFEM) 0.25-35 MG-MCG tablet, Take by mouth., Disp: , Rfl:  .  ondansetron (ZOFRAN-ODT) 8 MG disintegrating tablet, Take 1 tablet (8 mg total) by mouth every 8 (eight) hours as needed for nausea or vomiting., Disp: 30 tablet, Rfl: 5 .  SUMAtriptan (IMITREX) 25 MG tablet, Take 1 tablet (25 mg total) by mouth daily as needed for migraine. Daily prn repeat in 2 hrs if needed. Max dose in 1 day 200 mg total, Disp: 10 tablet, Rfl:  2  EXAM:  VITALS per patient if applicable:  GENERAL: alert, oriented, appears well and in no acute distress  HEENT: atraumatic, conjunttiva clear, no obvious abnormalities on inspection of external nose and ears  NECK: normal movements of the head and neck  LUNGS: on inspection no signs of respiratory distress, breathing rate appears normal, no obvious gross SOB, gasping or wheezing  CV: no obvious cyanosis  MS: moves all visible extremities without noticeable abnormality  PSYCH/NEURO: pleasant and cooperative, no obvious depression or anxiety, speech and thought processing grossly intact  ASSESSMENT AND PLAN:  Discussed the following assessment and plan:  COVID-19 virus infection  rec D3 5000 IU qd, vitamin C 1000 mg qd, zinc 100 mg qd, quercetin 500 mg bid  Doing better will return to work 01/12/19 possibly but has hesitation  Disc cont wearing mask and retest if wants to know about being + still  Ok to get vaccine in future  No need for labs, cxr   HM Flu shot 09/22/18 at work  Tdap 11/14/10  OB/GYN Dr. Leonides Schanz pap3/11/19 neg pap neg HPV mammo8/7/20neg Colonoscopy due age46y.[place] dad dx'ed 42 will start routine screening q5 years age 43 y.o  [id number] IU qd and calcium 600 mg 2x per dayalso taking vitamin C, D biotin and E  Declines STD testing  rec f/u dermatology for 2020 on [facility] c/w mole on right face establishedsaw  2020 rec Cerave no bx's  -we discussed possible serious and likely etiologies, options for evaluation and workup, limitations of telemedicine visit vs in person visit, treatment, treatment risks and precautions. Pt prefers to treat via telemedicine empirically rather then risking or undertaking an in person visit at this moment. Patient agrees to seek prompt in person care if worsening, new symptoms arise, or if is not improving with treatment.   I discussed the assessment and treatment plan with the patient. The patient was provided an  opportunity to ask questions and all were answered. The patient agreed with the plan and demonstrated an understanding of the instructions.   The patient was advised to call back or seek an in-person evaluation if the symptoms worsen or if the condition fails to improve as anticipated.  Time spent 15-20 minutes Delorise Jackson, MD

## 2019-01-23 DIAGNOSIS — R002 Palpitations: Secondary | ICD-10-CM | POA: Diagnosis not present

## 2019-01-31 ENCOUNTER — Encounter: Payer: Self-pay | Admitting: Internal Medicine

## 2019-04-06 ENCOUNTER — Ambulatory Visit: Payer: 59 | Admitting: Internal Medicine

## 2019-04-11 ENCOUNTER — Encounter: Payer: Self-pay | Admitting: Internal Medicine

## 2019-04-11 ENCOUNTER — Other Ambulatory Visit: Payer: Self-pay

## 2019-04-11 ENCOUNTER — Ambulatory Visit: Payer: 59 | Admitting: Internal Medicine

## 2019-04-11 VITALS — BP 106/70 | HR 75 | Temp 97.7°F | Ht 66.0 in | Wt 130.4 lb

## 2019-04-11 DIAGNOSIS — Z13220 Encounter for screening for lipoid disorders: Secondary | ICD-10-CM

## 2019-04-11 DIAGNOSIS — Z1329 Encounter for screening for other suspected endocrine disorder: Secondary | ICD-10-CM

## 2019-04-11 DIAGNOSIS — Z1389 Encounter for screening for other disorder: Secondary | ICD-10-CM | POA: Diagnosis not present

## 2019-04-11 DIAGNOSIS — E559 Vitamin D deficiency, unspecified: Secondary | ICD-10-CM

## 2019-04-11 DIAGNOSIS — R000 Tachycardia, unspecified: Secondary | ICD-10-CM

## 2019-04-11 DIAGNOSIS — Z1231 Encounter for screening mammogram for malignant neoplasm of breast: Secondary | ICD-10-CM | POA: Diagnosis not present

## 2019-04-11 DIAGNOSIS — Z0000 Encounter for general adult medical examination without abnormal findings: Secondary | ICD-10-CM

## 2019-04-11 NOTE — Patient Instructions (Signed)
COVID-19 Vaccine Information can be found at: [email or web address] For questions related to vaccine distribution or appointments, please email [email or web address] or call [phone].    

## 2019-04-11 NOTE — Progress Notes (Signed)
Chief Complaint  Patient presents with  . Follow-up   F/u 1.ST improved since 12/2018  Heart monitor negative per cards except ST no further issues  2. Better since covid 19 12/2018  No complaints    Review of Systems  Constitutional: Negative for weight loss.  HENT: Negative for hearing loss.   Eyes: Negative for blurred vision.  Respiratory: Negative for shortness of breath.   Cardiovascular: Negative for chest pain.  Gastrointestinal: Negative for abdominal pain.  Musculoskeletal: Negative for falls.  Skin: Negative for rash.  Neurological: Negative for headaches.  Psychiatric/Behavioral: Negative for memory loss.   Past Medical History:  Diagnosis Date  . Anxiety   . Fatigue   . Low back pain   . Migraine   . Miscarriage    Past Surgical History:  Procedure Laterality Date  . BREAST CYST EXCISION Right 2003   benign    Family History  Problem Relation Age of Onset  . Lung cancer Father 61  . Colon cancer Father 70  . Cancer Father        lung and colon cancer age 69  . Arthritis Mother   . Diabetes Mother   . Hyperlipidemia Mother   . Hypertension Mother    Social History   Socioeconomic History  . Marital status: Married    Spouse name: Not on file  . Number of children: Not on file  . Years of education: Not on file  . Highest education level: Not on file  Occupational History  . Not on file  Tobacco Use  . Smoking status: Never Smoker  . Smokeless tobacco: Never Used  Substance and Sexual Activity  . Alcohol use: No  . Drug use: No  . Sexual activity: Yes  Other Topics Concern  . 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 Difficulty of Paying Living Expenses:   Food Insecurity:   . Worried About Charity fundraiser in the Last Year:   . Arboriculturist in  the Last Year:   Transportation Needs:   . Film/video editor (Medical):   Marland Kitchen Lack of Transportation (Non-Medical):   Physical Activity:   . Days of Exercise per Week:   . Minutes of Exercise per Session:   Stress:   . Feeling of Stress :   Social Connections:   . Frequency of Communication with Friends and Family:   . Frequency of Social Gatherings with Friends and Family:   . Attends Religious Services:   . Active Member of Clubs or Organizations:   . Attends Archivist Meetings:   Marland Kitchen Marital Status:   Intimate Partner Violence:   . Fear of Current or Ex-Partner:   . Emotionally Abused:   Marland Kitchen Physically Abused:   . Sexually Abused:    Current Meds  Medication Sig  . Ascorbic Acid (VITAMIN C PO) Take by mouth.  . Multiple Vitamins-Minerals (ZINC PO) Take by mouth.  . norgestimate-ethinyl estradiol (ORTHO-CYCLEN,SPRINTEC,PREVIFEM) 0.25-35 MG-MCG tablet Take by mouth.  . ondansetron (ZOFRAN-ODT) 8 MG disintegrating tablet Take 1 tablet (8 mg total) by mouth every 8 (eight) hours as needed for nausea or vomiting.  . SUMAtriptan (IMITREX) 25 MG tablet Take 1  tablet (25 mg total) by mouth daily as needed for migraine. Daily prn repeat in 2 hrs if needed. Max dose in 1 day 200 mg total  . VITAMIN D PO Take by mouth.   No Known Allergies No results found for this or any previous visit (from the past 2160 hour(s)). Objective  Body mass index is 21.05 kg/m. Wt Readings from Last 3 Encounters:  04/11/19 130 lb 6.4 oz (59.1 kg)  01/04/19 140 lb (63.5 kg)  12/16/18 140 lb (63.5 kg)   Temp Readings from Last 3 Encounters:  04/11/19 97.7 F (36.5 C) (Temporal)  12/15/18 (!) 96.8 F (36 C) (Temporal)  10/05/18 (!) 97.2 F (36.2 C) (Oral)   BP Readings from Last 3 Encounters:  04/11/19 106/70  12/16/18 104/76  12/15/18 98/62   Pulse Readings from Last 3 Encounters:  04/11/19 75  12/16/18 72  12/15/18 75    Physical Exam Vitals and nursing note reviewed.   Constitutional:      Appearance: Normal appearance. She is well-developed and well-groomed.  HENT:     Head: Normocephalic and atraumatic.  Eyes:     Conjunctiva/sclera: Conjunctivae normal.     Pupils: Pupils are equal, round, and reactive to light.  Cardiovascular:     Rate and Rhythm: Normal rate and regular rhythm.     Heart sounds: No murmur.  Pulmonary:     Effort: Pulmonary effort is normal.     Breath sounds: Normal breath sounds.  Skin:    General: Skin is warm and dry.  Neurological:     General: No focal deficit present.     Mental Status: She is alert and oriented to person, place, and time. Mental status is at baseline.  Psychiatric:        Attention and Perception: Attention and perception normal.        Mood and Affect: Mood and affect normal.        Speech: Speech normal.        Behavior: Behavior normal. Behavior is cooperative.        Thought Content: Thought content normal.        Cognition and Memory: Cognition and memory normal.        Judgment: Judgment normal.     Assessment  Plan  Sinus tachycardia Resolved   Vitamin D deficiency - Plan: Vitamin D (25 hydroxy) rec 4000-5000 IU qd     HM CPE at f/u Flu shot9/17/20 at work Tdap11/9/12 covid 1/2 2nd due 04/14/19   OB/GYN Dr. Leonides Schanz pap3/11/19 neg pap neg HPV mammo8/7/20negordered  Colonoscopy due age46y.[place] dad dx'ed 105 will start routine screening q5 years age 33 y.o  D3 4000-5000 IU qd and calcium 600 mg 2x per dayalso taking vitamin C, D biotin and E  Declines STD testing  rec f/u dermatology for 2020 on [facility] c/w mole on right face establishedsaw 2020 rec Cerave no bx's -we discussed possible serious and likely   Provider: Dr. Olivia Mackie McLean-Scocuzza-Internal Medicine

## 2019-07-14 DIAGNOSIS — N898 Other specified noninflammatory disorders of vagina: Secondary | ICD-10-CM | POA: Diagnosis not present

## 2019-07-14 DIAGNOSIS — Z1151 Encounter for screening for human papillomavirus (HPV): Secondary | ICD-10-CM | POA: Diagnosis not present

## 2019-07-14 DIAGNOSIS — Z124 Encounter for screening for malignant neoplasm of cervix: Secondary | ICD-10-CM | POA: Diagnosis not present

## 2019-07-14 DIAGNOSIS — Z01419 Encounter for gynecological examination (general) (routine) without abnormal findings: Secondary | ICD-10-CM | POA: Diagnosis not present

## 2019-07-19 DIAGNOSIS — R8290 Unspecified abnormal findings in urine: Secondary | ICD-10-CM | POA: Diagnosis not present

## 2019-07-27 DIAGNOSIS — N898 Other specified noninflammatory disorders of vagina: Secondary | ICD-10-CM | POA: Diagnosis not present

## 2019-08-03 DIAGNOSIS — G43719 Chronic migraine without aura, intractable, without status migrainosus: Secondary | ICD-10-CM | POA: Diagnosis not present

## 2019-08-09 DIAGNOSIS — G43709 Chronic migraine without aura, not intractable, without status migrainosus: Secondary | ICD-10-CM | POA: Diagnosis not present

## 2019-08-14 ENCOUNTER — Ambulatory Visit
Admission: RE | Admit: 2019-08-14 | Discharge: 2019-08-14 | Disposition: A | Discharge status: Home / Self Care | Payer: 59 | Source: Ambulatory Visit | Attending: Internal Medicine | Admitting: Internal Medicine

## 2019-08-14 ENCOUNTER — Other Ambulatory Visit: Payer: Self-pay

## 2019-08-14 DIAGNOSIS — Z1231 Encounter for screening mammogram for malignant neoplasm of breast: Secondary | ICD-10-CM | POA: Insufficient documentation

## 2019-08-23 DIAGNOSIS — R8290 Unspecified abnormal findings in urine: Secondary | ICD-10-CM | POA: Diagnosis not present

## 2019-08-23 DIAGNOSIS — N949 Unspecified condition associated with female genital organs and menstrual cycle: Secondary | ICD-10-CM | POA: Diagnosis not present

## 2019-08-23 DIAGNOSIS — N898 Other specified noninflammatory disorders of vagina: Secondary | ICD-10-CM | POA: Diagnosis not present

## 2019-10-10 ENCOUNTER — Encounter: Payer: 59 | Admitting: Internal Medicine

## 2019-10-12 ENCOUNTER — Ambulatory Visit (INDEPENDENT_AMBULATORY_CARE_PROVIDER_SITE_OTHER): Payer: 59 | Admitting: Internal Medicine

## 2019-10-12 ENCOUNTER — Encounter: Payer: Self-pay | Admitting: Internal Medicine

## 2019-10-12 ENCOUNTER — Other Ambulatory Visit: Payer: Self-pay

## 2019-10-12 VITALS — BP 112/68 | HR 70 | Temp 98.2°F | Ht 66.0 in | Wt 130.2 lb

## 2019-10-12 DIAGNOSIS — Z1329 Encounter for screening for other suspected endocrine disorder: Secondary | ICD-10-CM | POA: Diagnosis not present

## 2019-10-12 DIAGNOSIS — E538 Deficiency of other specified B group vitamins: Secondary | ICD-10-CM

## 2019-10-12 DIAGNOSIS — Z0000 Encounter for general adult medical examination without abnormal findings: Secondary | ICD-10-CM | POA: Diagnosis not present

## 2019-10-12 DIAGNOSIS — E559 Vitamin D deficiency, unspecified: Secondary | ICD-10-CM | POA: Diagnosis not present

## 2019-10-12 DIAGNOSIS — Z1231 Encounter for screening mammogram for malignant neoplasm of breast: Secondary | ICD-10-CM

## 2019-10-12 DIAGNOSIS — R8290 Unspecified abnormal findings in urine: Secondary | ICD-10-CM

## 2019-10-12 DIAGNOSIS — R5383 Other fatigue: Secondary | ICD-10-CM

## 2019-10-12 DIAGNOSIS — N3000 Acute cystitis without hematuria: Secondary | ICD-10-CM

## 2019-10-12 DIAGNOSIS — E611 Iron deficiency: Secondary | ICD-10-CM

## 2019-10-12 DIAGNOSIS — Z13220 Encounter for screening for lipoid disorders: Secondary | ICD-10-CM

## 2019-10-12 DIAGNOSIS — Z0184 Encounter for antibody response examination: Secondary | ICD-10-CM

## 2019-10-12 DIAGNOSIS — Z8616 Personal history of COVID-19: Secondary | ICD-10-CM

## 2019-10-12 NOTE — Patient Instructions (Addendum)
[facility] ? [facility] therapy for [employer]  [phone]  Mindfulness-Based Stress Reduction Mindfulness-based stress reduction (MBSR) is a program that helps people learn to practice mindfulness. Mindfulness is the practice of intentionally paying attention to the present moment. It can be learned and practiced through techniques such as education, breathing exercises, meditation, and yoga. MBSR includes several mindfulness techniques in one program. MBSR works best when you understand the treatment, are willing to try new things, and can commit to spending time practicing what you learn. MBSR training may include learning about:  How your emotions, thoughts, and reactions affect your body.  New ways to respond to things that cause negative thoughts to start (triggers).  How to notice your thoughts and let go of them.  Practicing awareness of everyday things that you normally do without thinking.  The techniques and goals of different types of meditation. What are the benefits of MBSR? MBSR can have many benefits, which include helping you to:  Develop self-awareness. This refers to knowing and understanding yourself.  Learn skills and attitudes that help you to participate in your own health care.  Learn new ways to care for yourself.  Be more accepting about how things are, and let things go.  Be less judgmental and approach things with an open mind.  Be patient with yourself and trust yourself more. MBSR has also been shown to:  Reduce negative emotions, such as depression and anxiety.  Improve memory and focus.  Change how you sense and approach pain.  Boost your body's ability to fight infections.  Help you connect better with other people.  Improve your sense of well-being. Follow these instructions at home:   Find a local in-person or online MBSR program.  Set aside some time regularly for mindfulness practice.  Find a mindfulness practice that works best  for you. This may include one or more of the following: ? Meditation. Meditation involves focusing your mind on a certain thought or activity. ? Breathing awareness exercises. These help you to stay present by focusing on your breath. ? Body scan. For this practice, you lie down and pay attention to each part of your body from head to toe. You can identify tension and soreness and intentionally relax parts of your body. ? Yoga. Yoga involves stretching and breathing, and it can improve your ability to move and be flexible. It can also provide an experience of testing your body's limits, which can help you release stress. ? Mindful eating. This way of eating involves focusing on the taste, texture, color, and smell of each bite of food. Because this slows down eating and helps you feel full sooner, it can be an important part of a weight-loss plan.  Find a podcast or recording that provides guidance for breathing awareness, body scan, or meditation exercises. You can listen to these any time when you have a free moment to rest without distractions.  Follow your treatment plan as told by your health care provider. This may include taking regular medicines and making changes to your diet or lifestyle as recommended. How to practice mindfulness To do a basic awareness exercise:  Find a comfortable place to sit.  Pay attention to the present moment. Observe your thoughts, feelings, and surroundings just as they are.  Avoid placing judgment on yourself, your feelings, or your surroundings. Make note of any judgment that comes up, and let it go.  Your mind may wander, and that is okay. Make note of when your  thoughts drift, and return your attention to the present moment. To do basic mindfulness meditation:  Find a comfortable place to sit. This may include a stable chair or a firm floor cushion. ? Sit upright with your back straight. Let your arms fall next to your side with your hands resting on  your legs. ? If sitting in a chair, rest your feet flat on the floor. ? If sitting on a cushion, cross your legs in front of you.  Keep your head in a neutral position with your chin dropped slightly. Relax your jaw and rest the tip of your tongue on the roof of your mouth. Drop your gaze to the floor. You can close your eyes if you like.  Breathe normally and pay attention to your breath. Feel the air moving in and out of your nose. Feel your belly expanding and relaxing with each breath.  Your mind may wander, and that is okay. Make note of when your thoughts drift, and return your attention to your breath.  Avoid placing judgment on yourself, your feelings, or your surroundings. Make note of any judgment or feelings that come up, let them go, and bring your attention back to your breath.  When you are ready, lift your gaze or open your eyes. Pay attention to how your body feels after the meditation. Where to find more information You can find more information about MBSR from:  Your health care provider.  Community-based meditation centers or programs.  Programs offered near you. Summary  Mindfulness-based stress reduction (MBSR) is a program that teaches you how to intentionally pay attention to the present moment. It is used with other treatments to help you cope better with daily stress, emotions, and pain.  MBSR focuses on developing self-awareness, which allows you to respond to life stress without judgment or negative emotions.  MBSR programs may involve learning different mindfulness practices, such as breathing exercises, meditation, yoga, body scan, or mindful eating. Find a mindfulness practice that works best for you, and set aside time for it on a regular basis. This information is not intended to replace advice given to you by your health care provider. Make sure you discuss any questions you have with your health care provider. Document Revised: 12/04/2016 Document  Reviewed: 04/30/2016 Elsevier Patient Education  [place].

## 2019-10-12 NOTE — Progress Notes (Signed)
Chief Complaint  Patient presents with  . Annual Exam   Annual  1. Fatigue and anxiety work related declines medication    Review of Systems  Constitutional: Negative for weight loss.  HENT: Negative for hearing loss.   Eyes: Negative for blurred vision.  Cardiovascular: Negative for chest pain.  Gastrointestinal: Negative for abdominal pain.  Musculoskeletal: Negative for falls.  Skin: Negative for rash.  Neurological: Negative for headaches.  Psychiatric/Behavioral: Negative for depression.   Past Medical History:  Diagnosis Date  . Anxiety   . Fatigue   . Low back pain   . Migraine   . Miscarriage    Past Surgical History:  Procedure Laterality Date  . BREAST CYST EXCISION Right 2003   benign    Family History  Problem Relation Age of Onset  . Lung cancer Father 52  . Colon cancer Father 27  . Cancer Father        lung and colon cancer age 54  . Arthritis Mother   . Diabetes Mother   . Hyperlipidemia Mother   . Hypertension Mother   . Breast cancer Neg Hx    Social History   Socioeconomic History  . Marital status: Married    Spouse name: Not on file  . Number of children: Not on file  . Years of education: Not on file  . Highest education level: Not on file  Occupational History  . Not on file  Tobacco Use  . Smoking status: Never Smoker  . Smokeless tobacco: Never Used  Vaping Use  . Vaping Use: Never used  Substance and Sexual Activity  . Alcohol use: No  . Drug use: No  . Sexual activity: Yes  Other Topics Concern  . 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 Difficulty of Paying Living Expenses: Not on file  Food Insecurity:   . Worried About Charity fundraiser in the Last Year: Not on file  . Ran Out of Food in the Last Year: Not on file  Transportation  Needs:   . Lack of Transportation (Medical): Not on file  . Lack of Transportation (Non-Medical): Not on file  Physical Activity:   . Days of Exercise per Week: Not on file  . Minutes of Exercise per Session: Not on file  Stress:   . Feeling of Stress : Not on file  Social Connections:   . Frequency of Communication with Friends and Family: Not on file  . Frequency of Social Gatherings with Friends and Family: Not on file  . Attends Religious Services: Not on file  . Active Member of Clubs or Organizations: Not on file  . Attends Archivist Meetings: Not on file  . Marital Status: Not on file  Intimate Partner Violence:   . Fear of Current or Ex-Partner: Not on file  . Emotionally Abused: Not on file  . Physically Abused: Not on file  . Sexually Abused: Not on file   Current Meds  Medication Sig  . Ascorbic Acid (VITAMIN C PO) Take by mouth.  Marland Kitchen MAGNESIUM PO Take by mouth.  . Multiple Vitamins-Minerals (ZINC PO) Take by mouth.  . norgestimate-ethinyl estradiol (ORTHO-CYCLEN,SPRINTEC,PREVIFEM) 0.25-35 MG-MCG tablet Take by mouth.  Marland Kitchen  ondansetron (ZOFRAN-ODT) 8 MG disintegrating tablet Take 1 tablet (8 mg total) by mouth every 8 (eight) hours as needed for nausea or vomiting.  . Riboflavin (B2 PO) Take by mouth.  . SUMAtriptan (IMITREX) 25 MG tablet Take 1 tablet (25 mg total) by mouth daily as needed for migraine. Daily prn repeat in 2 hrs if needed. Max dose in 1 day 200 mg total  . VITAMIN D PO Take by mouth.   No Known Allergies No results found for this or any previous visit (from the past 2160 hour(s)). Objective  Body mass index is 21.01 kg/m. Wt Readings from Last 3 Encounters:  10/12/19 130 lb 3.2 oz (59.1 kg)  04/11/19 130 lb 6.4 oz (59.1 kg)  01/04/19 140 lb (63.5 kg)   Temp Readings from Last 3 Encounters:  10/12/19 98.2 F (36.8 C) (Oral)  04/11/19 97.7 F (36.5 C) (Temporal)  12/15/18 (!) 96.8 F (36 C) (Temporal)   BP Readings from Last 3  Encounters:  10/12/19 112/68  04/11/19 106/70  12/16/18 104/76   Pulse Readings from Last 3 Encounters:  10/12/19 70  04/11/19 75  12/16/18 72    Physical Exam Vitals and nursing note reviewed.  Constitutional:      Appearance: Normal appearance. She is well-developed and well-groomed.  HENT:     Head: Normocephalic and atraumatic.  Eyes:     Conjunctiva/sclera: Conjunctivae normal.     Pupils: Pupils are equal, round, and reactive to light.  Cardiovascular:     Rate and Rhythm: Normal rate and regular rhythm.     Pulses: Normal pulses.     Heart sounds: Normal heart sounds. No murmur heard.   Pulmonary:     Effort: Pulmonary effort is normal.     Breath sounds: Normal breath sounds.  Skin:    General: Skin is warm and dry.  Neurological:     General: No focal deficit present.     Mental Status: She is alert and oriented to person, place, and time. Mental status is at baseline.     Gait: Gait normal.  Psychiatric:        Attention and Perception: Attention and perception normal.        Mood and Affect: Mood normal.        Speech: Speech normal.        Behavior: Behavior normal. Behavior is cooperative.        Thought Content: Thought content normal.        Cognition and Memory: Cognition and memory normal.        Judgment: Judgment normal.     Assessment  Plan  Annual physical exam -  Plan: Comprehensive metabolic panel, Lipid panel, CBC with Differential/Platelet, TSH, T4, free  Abnormal urine odor - Plan: Urinalysis, Routine w reflex microscopic, Urine Culture  Acute cystitis without hematuria - Plan: Urinalysis, Routine w reflex microscopic, Urine Culture  Vitamin D deficiency - Plan: Vitamin D (25 hydroxy)  B12 deficiency - Plan: B12  Iron deficiency - Plan: Iron, TIBC and Ferritin Panel  Fatigue, unspecified type   HM Flu shot9/17/20 at workgetting 10/13/19 Tdap11/9/12 covid 2/2 disc booster   OB/GYN Dr. Leonides Schanz pap3/11/19 neg pap neg  HPV  mammo8/9/21negordered   Colonoscopy due age46y.[place] dad dx'ed 57 will start routine screening q5 years age 91 y.o   Skin no issues mole right face   D3 4000-5000 IU qd and calcium 600 mg 2x per dayalso taking vitamin C, D biotin and E  Declines STD  testing  rec f/u dermatology for 2020 on [facility] c/w mole on right face establishedsaw 2020 rec [facility] no bx's   Provider: Dr. Olivia Mackie McLean-Scocuzza-Internal Medicine

## 2019-10-12 NOTE — Progress Notes (Signed)
Pre visit review using our clinic review tool, if applicable. No additional management support is needed unless otherwise documented below in the visit note. 

## 2019-10-13 ENCOUNTER — Other Ambulatory Visit (INDEPENDENT_AMBULATORY_CARE_PROVIDER_SITE_OTHER): Payer: 59

## 2019-10-13 DIAGNOSIS — Z8616 Personal history of COVID-19: Secondary | ICD-10-CM | POA: Diagnosis not present

## 2019-10-13 DIAGNOSIS — E538 Deficiency of other specified B group vitamins: Secondary | ICD-10-CM | POA: Diagnosis not present

## 2019-10-13 DIAGNOSIS — R8290 Unspecified abnormal findings in urine: Secondary | ICD-10-CM

## 2019-10-13 DIAGNOSIS — Z0184 Encounter for antibody response examination: Secondary | ICD-10-CM | POA: Diagnosis not present

## 2019-10-13 DIAGNOSIS — Z0000 Encounter for general adult medical examination without abnormal findings: Secondary | ICD-10-CM | POA: Diagnosis not present

## 2019-10-13 DIAGNOSIS — N3000 Acute cystitis without hematuria: Secondary | ICD-10-CM

## 2019-10-13 DIAGNOSIS — Z1329 Encounter for screening for other suspected endocrine disorder: Secondary | ICD-10-CM

## 2019-10-13 DIAGNOSIS — Z13220 Encounter for screening for lipoid disorders: Secondary | ICD-10-CM

## 2019-10-13 DIAGNOSIS — E559 Vitamin D deficiency, unspecified: Secondary | ICD-10-CM

## 2019-10-13 DIAGNOSIS — E611 Iron deficiency: Secondary | ICD-10-CM

## 2019-10-13 LAB — COMPREHENSIVE METABOLIC PANEL
ALT: 11 U/L (ref 0–35)
AST: 13 U/L (ref 0–37)
Albumin: 4 g/dL (ref 3.5–5.2)
Alkaline Phosphatase: 34 U/L — ABNORMAL LOW (ref 39–117)
BUN: 13 mg/dL (ref 6–23)
CO2: 27 mEq/L (ref 19–32)
Calcium: 9 mg/dL (ref 8.4–10.5)
Chloride: 106 mEq/L (ref 96–112)
Creatinine, Ser: 0.53 mg/dL (ref 0.40–1.20)
GFR: 116.17 mL/min (ref >60.00)
Glucose, Bld: 88 mg/dL (ref 70–99)
Potassium: 4.1 mEq/L (ref 3.5–5.1)
Sodium: 138 mEq/L (ref 135–145)
Total Bilirubin: 0.6 mg/dL (ref 0.2–1.2)
Total Protein: 6.7 g/dL (ref 6.0–8.3)

## 2019-10-13 LAB — LIPID PANEL
Cholesterol: 136 mg/dL (ref 0–200)
HDL: 47.8 mg/dL (ref >39.00)
LDL Cholesterol: 69 mg/dL (ref 0–99)
NonHDL: 88.12
Total CHOL/HDL Ratio: 3
Triglycerides: 97 mg/dL (ref 0.0–149.0)
VLDL: 19.4 mg/dL (ref 0.0–40.0)

## 2019-10-13 LAB — CBC WITH DIFFERENTIAL/PLATELET
Basophils Absolute: 0.1 10*3/uL (ref 0.0–0.1)
Basophils Relative: 1 % (ref 0.0–3.0)
Eosinophils Absolute: 0.1 10*3/uL (ref 0.0–0.7)
Eosinophils Relative: 1.3 % (ref 0.0–5.0)
HCT: 37.3 % (ref 36.0–46.0)
Hemoglobin: 12.9 g/dL (ref 12.0–15.0)
Lymphocytes Relative: 24.2 % (ref 12.0–46.0)
Lymphs Abs: 1.8 10*3/uL (ref 0.7–4.0)
MCHC: 34.6 g/dL (ref 30.0–36.0)
MCV: 91.5 fl (ref 78.0–100.0)
Monocytes Absolute: 0.5 10*3/uL (ref 0.1–1.0)
Monocytes Relative: 6.5 % (ref 3.0–12.0)
Neutro Abs: 5 10*3/uL (ref 1.4–7.7)
Neutrophils Relative %: 67 % (ref 43.0–77.0)
Platelets: 268 10*3/uL (ref 150.0–400.0)
RBC: 4.08 Mil/uL (ref 3.87–5.11)
RDW: 12.6 % (ref 11.5–15.5)
WBC: 7.4 10*3/uL (ref 4.0–10.5)

## 2019-10-13 LAB — VITAMIN B12: Vitamin B-12: 275 pg/mL (ref 211–911)

## 2019-10-13 LAB — T4, FREE: Free T4: 0.71 ng/dL (ref 0.60–1.60)

## 2019-10-13 LAB — VITAMIN D 25 HYDROXY (VIT D DEFICIENCY, FRACTURES): VITD: 46.98 ng/mL (ref 30.00–100.00)

## 2019-10-13 LAB — TSH: TSH: 0.61 u[IU]/mL (ref 0.35–4.50)

## 2019-10-13 NOTE — Addendum Note (Signed)
Addended by: Orland Mustard on: 10/13/2019 07:33 AM   Modules accepted: Orders

## 2019-10-14 LAB — SARS-COV-2 SEMI-QUANTITATIVE TOTAL ANTIBODY, SPIKE
SARS-CoV-2 Semi-Quant Total Ab: >2500 U/mL (ref <0.8)
SARS-CoV-2 Spike Ab Interp: POSITIVE

## 2019-10-15 LAB — URINALYSIS, ROUTINE W REFLEX MICROSCOPIC
Bilirubin Urine: NEGATIVE
Glucose, UA: NEGATIVE
Hgb urine dipstick: NEGATIVE
Ketones, ur: NEGATIVE
Leukocytes,Ua: NEGATIVE
Nitrite: NEGATIVE
Protein, ur: NEGATIVE
Specific Gravity, Urine: 1.018 (ref 1.001–1.03)
pH: 5.5 (ref 5.0–8.0)

## 2019-10-15 LAB — URINE CULTURE
MICRO NUMBER:: 11049646
Result:: NO GROWTH
SPECIMEN QUALITY:: ADEQUATE

## 2019-11-09 ENCOUNTER — Encounter: Payer: 59 | Admitting: Internal Medicine

## 2019-11-16 ENCOUNTER — Other Ambulatory Visit: Payer: Self-pay

## 2019-11-16 ENCOUNTER — Encounter: Payer: Self-pay | Admitting: Internal Medicine

## 2019-11-16 ENCOUNTER — Ambulatory Visit: Payer: 59 | Admitting: Internal Medicine

## 2019-11-16 VITALS — BP 100/70 | HR 71 | Temp 98.1°F | Ht 66.0 in | Wt 130.4 lb

## 2019-11-16 DIAGNOSIS — F419 Anxiety disorder, unspecified: Secondary | ICD-10-CM | POA: Diagnosis not present

## 2019-11-16 DIAGNOSIS — G8929 Other chronic pain: Secondary | ICD-10-CM

## 2019-11-16 DIAGNOSIS — E611 Iron deficiency: Secondary | ICD-10-CM | POA: Diagnosis not present

## 2019-11-16 DIAGNOSIS — I491 Atrial premature depolarization: Secondary | ICD-10-CM

## 2019-11-16 DIAGNOSIS — M25512 Pain in left shoulder: Secondary | ICD-10-CM

## 2019-11-16 DIAGNOSIS — R002 Palpitations: Secondary | ICD-10-CM | POA: Diagnosis not present

## 2019-11-16 DIAGNOSIS — R000 Tachycardia, unspecified: Secondary | ICD-10-CM | POA: Diagnosis not present

## 2019-11-16 DIAGNOSIS — I493 Ventricular premature depolarization: Secondary | ICD-10-CM | POA: Diagnosis not present

## 2019-11-16 DIAGNOSIS — R0789 Other chest pain: Secondary | ICD-10-CM

## 2019-11-16 DIAGNOSIS — R5383 Other fatigue: Secondary | ICD-10-CM | POA: Diagnosis not present

## 2019-11-16 DIAGNOSIS — R202 Paresthesia of skin: Secondary | ICD-10-CM

## 2019-11-16 DIAGNOSIS — G43919 Migraine, unspecified, intractable, without status migrainosus: Secondary | ICD-10-CM

## 2019-11-16 DIAGNOSIS — R200 Anesthesia of skin: Secondary | ICD-10-CM

## 2019-11-16 NOTE — Progress Notes (Signed)
Chief Complaint  Patient presents with  . Tachycardia  . Chest Pain  . Results  . Fatigue   F/u  1. C/o racing heart on and off and chest pain (denies GERD) and fatigue HR ranging from 55 to 182 at times 41 and prev holter with ST with +PACs and PVCS <1% of burden per cards Dr. Fletcher Anon HR at rest sometimes 110  She has been under stress at times with work but not sure if she will change job  She wants d dimer checked  2. Fatigue she wants iron levels checked this was not done with last  Labs apologized  3. H/a recently having to take 3 ibuprofen, excedrine, prn fiocet and imitrex and zofran established with [facility] neurology Dr. Manuella Ghazi and relpax 40 but stopped taking relpax  4. C/o chronic left shoulder pain at times radiating to left hand and numbness denies neck pain consider NCS/EMG with Dr. Manuella Ghazi as migraines not controlled  5. Anxiety her brother just started zoloft and she is not sure if she wants to start meds will Rx zoloft or celexa in future if she wants    Review of Systems  Constitutional: Negative for weight loss.  HENT: Negative for hearing loss.   Eyes: Negative for blurred vision.  Respiratory: Negative for shortness of breath.   Cardiovascular: Positive for chest pain and palpitations.  Skin: Negative for rash.  Neurological: Positive for headaches.  Psychiatric/Behavioral: The patient is nervous/anxious.    Past Medical History:  Diagnosis Date  . Anxiety   . Fatigue   . Low back pain   . Migraine   . Miscarriage    Past Surgical History:  Procedure Laterality Date  . BREAST CYST EXCISION Right 2003   benign    Family History  Problem Relation Age of Onset  . Lung cancer Father 82  . Colon cancer Father 34  . Cancer Father        lung and colon cancer age 29  . Arthritis Mother   . Diabetes Mother   . Hyperlipidemia Mother   . Hypertension Mother   . Breast cancer Neg Hx    Social History   Socioeconomic History  . Marital status: Married    Spouse  name: Not on file  . Number of children: Not on file  . Years of education: Not on file  . Highest education level: Not on file  Occupational History  . Not on file  Tobacco Use  . Smoking status: Never Smoker  . Smokeless tobacco: Never Used  Vaping Use  . Vaping Use: Never used  Substance and Sexual Activity  . Alcohol use: No  . Drug use: No  . Sexual activity: Yes  Other Topics Concern  . 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 Difficulty of Paying Living Expenses: Not on file  Food Insecurity:   . Worried About Charity fundraiser in the Last Year: Not on file  . Ran Out of Food in the Last Year: Not on file  Transportation Needs:   . Lack of Transportation (Medical): Not on file  . Lack of Transportation (Non-Medical): Not on file  Physical Activity:   . Days of Exercise  per Week: Not on file  . Minutes of Exercise per Session: Not on file  Stress:   . Feeling of Stress : Not on file  Social Connections:   . Frequency of Communication with Friends and Family: Not on file  . Frequency of Social Gatherings with Friends and Family: Not on file  . Attends Religious Services: Not on file  . Active Member of Clubs or Organizations: Not on file  . Attends Archivist Meetings: Not on file  . Marital Status: Not on file  Intimate Partner Violence:   . Fear of Current or Ex-Partner: Not on file  . Emotionally Abused: Not on file  . Physically Abused: Not on file  . Sexually Abused: Not on file   Current Meds  Medication Sig  . Ascorbic Acid (VITAMIN C PO) Take by mouth.  . Butalbital-APAP-Caffeine (FIORICET PO) Take by mouth.  Marland Kitchen MAGNESIUM PO Take by mouth.  . Multiple Vitamins-Minerals (ZINC PO) Take by mouth.  . norgestimate-ethinyl estradiol (ORTHO-CYCLEN,SPRINTEC,PREVIFEM)  0.25-35 MG-MCG tablet Take by mouth.  . ondansetron (ZOFRAN-ODT) 8 MG disintegrating tablet Take 1 tablet (8 mg total) by mouth every 8 (eight) hours as needed for nausea or vomiting.  . Riboflavin (B2 PO) Take by mouth.  . SUMAtriptan (IMITREX) 25 MG tablet Take 1 tablet (25 mg total) by mouth daily as needed for migraine. Daily prn repeat in 2 hrs if needed. Max dose in 1 day 200 mg total  . VITAMIN D PO Take by mouth.   No Known Allergies Recent Results (from the past 2160 hour(s))  SARS-CoV-2 Semi-Quantitative Total Antibody, Spike     Status: None   Collection Time: 10/13/19  9:44 AM  Result Value Ref Range   SARS-CoV-2 Semi-Quant Total Ab >2500.0 Negative<0.8 U/mL    Comment: Antibodies against the SARS-CoV-2 spike protein receptor binding domain (RBD) were detected. It is yet undetermined what level of antibody to SARS-CoV-2 spike protein correlates to immunity against developing symptomatic SARS-CoV-2 disease. Studies are underway to measure the quantitative levels of specific SARS-CoV-2 antibodies following vaccination. Such studies will provide valuable insights into the correlation between protection from vaccination and antibody levels.    SARS-CoV-2 Spike Ab Interp Positive     Comment: Roche Elecsys Anti-SARS-CoV-2 S  Urine Culture     Status: None   Collection Time: 10/13/19  9:44 AM   Specimen: Urine  Result Value Ref Range   MICRO NUMBER: [id number]    SPECIMEN QUALITY: Adequate    Sample Source NOT GIVEN    STATUS: FINAL    Result: No Growth   B12     Status: None   Collection Time: 10/13/19  9:44 AM  Result Value Ref Range   Vitamin B-12 275 211 - 911 pg/mL  Vitamin D (25 hydroxy)     Status: None   Collection Time: 10/13/19  9:44 AM  Result Value Ref Range   VITD 46.98 30.00 - 100.00 ng/mL  Urinalysis, Routine w reflex microscopic     Status: None   Collection Time: 10/13/19  9:44 AM  Result Value Ref Range   Color, Urine YELLOW YELLOW   APPearance CLEAR  CLEAR   Specific Gravity, Urine 1.018 1.001 - 1.03   pH 5.5 5.0 - 8.0   Glucose, UA NEGATIVE NEGATIVE   Bilirubin Urine NEGATIVE NEGATIVE   Ketones, ur NEGATIVE NEGATIVE   Hgb urine dipstick NEGATIVE NEGATIVE   Protein, ur NEGATIVE NEGATIVE   Nitrite NEGATIVE NEGATIVE   Leukocytes,Ua NEGATIVE NEGATIVE  T4, free     Status: None   Collection Time: 10/13/19  9:44 AM  Result Value Ref Range   Free T4 0.71 0.60 - 1.60 ng/dL    Comment: Specimens from patients who are undergoing biotin therapy and /or ingesting biotin supplements may contain high levels of biotin.  The higher biotin concentration in these specimens interferes with this Free T4 assay.  Specimens that contain high levels  of biotin may cause false high results for this Free T4 assay.  Please interpret results in light of the total clinical presentation of the patient.    TSH     Status: None   Collection Time: 10/13/19  9:44 AM  Result Value Ref Range   TSH 0.61 0.35 - 4.50 uIU/mL  CBC with Differential/Platelet     Status: None   Collection Time: 10/13/19  9:44 AM  Result Value Ref Range   WBC 7.4 4.0 - 10.5 K/uL   RBC 4.08 3.87 - 5.11 Mil/uL   Hemoglobin 12.9 12.0 - 15.0 g/dL   HCT 37.3 36 - 46 %   MCV 91.5 78.0 - 100.0 fl   MCHC 34.6 30.0 - 36.0 g/dL   RDW 12.6 11.5 - 15.5 %   Platelets 268.0 150 - 400 K/uL   Neutrophils Relative % 67.0 43 - 77 %   Lymphocytes Relative 24.2 12 - 46 %   Monocytes Relative 6.5 3 - 12 %   Eosinophils Relative 1.3 0 - 5 %   Basophils Relative 1.0 0 - 3 %   Neutro Abs 5.0 1.4 - 7.7 K/uL   Lymphs Abs 1.8 0.7 - 4.0 K/uL   Monocytes Absolute 0.5 0.1 - 1.0 K/uL   Eosinophils Absolute 0.1 0.0 - 0.7 K/uL   Basophils Absolute 0.1 0.0 - 0.1 K/uL  Lipid panel     Status: None   Collection Time: 10/13/19  9:44 AM  Result Value Ref Range   Cholesterol 136 0 - 200 mg/dL    Comment: ATP III Classification       Desirable:  < 200 mg/dL               Borderline High:  200 - 239 mg/dL           High:  > = 240 mg/dL   Triglycerides 97.0 0 - 149 mg/dL    Comment: Normal:  <150 mg/dLBorderline High:  150 - 199 mg/dL   HDL 47.80 >39.00 mg/dL   VLDL 19.4 0.0 - 40.0 mg/dL   LDL Cholesterol 69 0 - 99 mg/dL   Total CHOL/HDL Ratio 3     Comment:                Men          Women1/2 Average Risk     3.4          3.3Average Risk          5.0          4.42X Average Risk          9.6          7.13X Average Risk          15.0          11.0                       NonHDL 88.12     Comment: NOTE:  Non-HDL goal should be 30 mg/dL higher than patient's LDL goal (  i.e. LDL goal of < 70 mg/dL, would have non-HDL goal of < 100 mg/dL)  Comprehensive metabolic panel     Status: Abnormal   Collection Time: 10/13/19  9:44 AM  Result Value Ref Range   Sodium 138 135 - 145 mEq/L   Potassium 4.1 3.5 - 5.1 mEq/L   Chloride 106 96 - 112 mEq/L   CO2 27 19 - 32 mEq/L   Glucose, Bld 88 70 - 99 mg/dL   BUN 13 6 - 23 mg/dL   Creatinine, Ser 0.53 0.40 - 1.20 mg/dL   Total Bilirubin 0.6 0.2 - 1.2 mg/dL   Alkaline Phosphatase 34 (L) 39 - 117 U/L   AST 13 0 - 37 U/L   ALT 11 0 - 35 U/L   Total Protein 6.7 6.0 - 8.3 g/dL   Albumin 4.0 3.5 - 5.2 g/dL   GFR 116.17 >60.00 mL/min   Calcium 9.0 8.4 - 10.5 mg/dL  D-Dimer, Quantitative     Status: None   Collection Time: 11/16/19  4:13 PM  Result Value Ref Range   D-Dimer, Quant <0.19 <0.50 mcg/mL FEU    Comment: . The D-Dimer test is used frequently to exclude an acute PE or DVT. In patients with a low to moderate clinical risk assessment and a D-Dimer result <0.50 mcg/mL FEU, the likelihood of a PE or DVT is very low. However, a thromboembolic event should not be excluded solely on the basis of the D-Dimer level. Increased levels of D-Dimer are associated with a PE, DVT, DIC, malignancies, inflammation, sepsis, surgery, trauma, pregnancy, and advancing patient age. [Jama 2006 11:295(2):199-207] . For additional information, please refer  to: http://education.questdiagnostics.com/faq/FAQ149 (This link is being provided for informational/ educational purposes only) .   Iron, TIBC and Ferritin Panel     Status: Abnormal   Collection Time: 11/16/19  4:13 PM  Result Value Ref Range   Iron 174 40 - 190 mcg/dL   TIBC 348 250 - 450 mcg/dL (calc)   %SAT 50 (H) 16 - 45 % (calc)   Ferritin 28 16 - 232 ng/mL   Objective  Body mass index is 21.05 kg/m. Wt Readings from Last 3 Encounters:  11/16/19 130 lb 6.4 oz (59.1 kg)  10/12/19 130 lb 3.2 oz (59.1 kg)  04/11/19 130 lb 6.4 oz (59.1 kg)   Temp Readings from Last 3 Encounters:  11/16/19 98.1 F (36.7 C) (Oral)  10/12/19 98.2 F (36.8 C) (Oral)  04/11/19 97.7 F (36.5 C) (Temporal)   BP Readings from Last 3 Encounters:  11/16/19 100/70  10/12/19 112/68  04/11/19 106/70   Pulse Readings from Last 3 Encounters:  11/16/19 71  10/12/19 70  04/11/19 75    Physical Exam Vitals and nursing note reviewed.  Constitutional:      Appearance: Normal appearance. She is well-developed and well-groomed.  HENT:     Head: Normocephalic and atraumatic.  Cardiovascular:     Rate and Rhythm: Normal rate and regular rhythm.     Heart sounds: Normal heart sounds.  Pulmonary:     Effort: Pulmonary effort is normal.     Breath sounds: Normal breath sounds.  Skin:    General: Skin is warm and dry.  Neurological:     General: No focal deficit present.     Mental Status: She is alert and oriented to person, place, and time.     Gait: Gait normal.  Psychiatric:        Attention and Perception: Attention and perception normal.  Mood and Affect: Mood and affect normal.        Speech: Speech normal.        Behavior: Behavior normal. Behavior is cooperative.        Cognition and Memory: Cognition and memory normal.     Assessment  Plan  Atypical chest pain Palpitations with ST h/o PAC/PVCS - Plan: EKG  12-Lead NSR today, D-Dimer, Quantitative negative  F/u Dr. Fletcher Anon  see HPI  Iron deficiency - Plan: Iron, TIBC and Ferritin Panel  Anxiety Consider celexa or zoloft  Given # to call EACP  Chronic left shoulder pain with left hand numbness  -consider NCS/EMG with neurology and migraines not controlled disc ajovy, emgality or aimovig   Fatigue could be related to anxiety  HM Flu shotutd 10/13/19 Tdap11/9/12 covid 2/2 disc booster   OB/GYN Dr. Leonides Schanz pap3/11/19 neg pap neg HPV  mammo8/9/21negordered   Colonoscopy due age46y.Parker dad dx'ed 20 will start routine screening q5 years age 65 y.o   Skin no issues mole right face   [id number] IU qd and calcium 600 mg 2x per dayalso taking vitamin C, D biotin and E  Declines STD testing  rec f/u dermatology for 2020 on [facility] c/w mole on right face establishedsaw 2020 rec [facility] no bx's  Provider: Dr. Olivia Mackie McLean-Scocuzza-Internal Medicine

## 2019-11-16 NOTE — Patient Instructions (Addendum)
EACP therapy [phone] zoloft or celexa   Debrox ear wax drops left ear  Ear flush warm water and hydrogen peroxide   Erenumab injection What is this medicine? ERENUMAB (e REN ue mab) is used to prevent migraine headaches. This medicine may be used for other purposes; ask your health care provider or pharmacist if you have questions. COMMON BRAND NAME(S): Aimovig What should I tell my health care provider before I take this medicine? They need to know if you have any of these conditions:  an unusual or allergic reaction to erenumab, latex, other medicines, foods, dyes, or preservatives  high blood pressure  pregnant or trying to get pregnant  breast-feeding How should I use this medicine? This medicine is for injection under the skin. You will be taught how to prepare and give this medicine. Use exactly as directed. Take your medicine at regular intervals. Do not take your medicine more often than directed. It is important that you put your used needles and syringes in a special sharps container. Do not put them in a trash can. If you do not have a sharps container, call your pharmacist or healthcare provider to get one. Talk to your pediatrician regarding the use of this medicine in children. Special care may be needed. Overdosage: If you think you have taken too much of this medicine contact a poison control center or emergency room at once. NOTE: This medicine is only for you. Do not share this medicine with others. What if I miss a dose? If you miss a dose, take it as soon as you can. If it is almost time for your next dose, take only that dose. Do not take double or extra doses. What may interact with this medicine? Interactions are not expected. This list may not describe all possible interactions. Give your health care provider a list of all the medicines, herbs, non-prescription drugs, or dietary supplements you use. Also tell them if you smoke, drink alcohol, or use illegal  drugs. Some items may interact with your medicine. What should I watch for while using this medicine? Tell your doctor or healthcare professional if your symptoms do not start to get better or if they get worse. What side effects may I notice from receiving this medicine? Side effects that you should report to your doctor or health care professional as soon as possible:  allergic reactions like skin rash, itching or hives, swelling of the face, lips, or tongue  chest pain  fast, irregular heartbeat  feeling faint or lightheaded  palpitations Side effects that usually do not require medical attention (report these to your doctor or health care professional if they continue or are bothersome):  constipation  muscle cramps  pain, redness, or irritation at site where injected This list may not describe all possible side effects. Call your doctor for medical advice about side effects. You may report side effects to FDA at 1-800-FDA-1088. Where should I keep my medicine? Keep out of the reach of children. You will be instructed on how to store this medicine. Throw away any unused medicine after the expiration date on the label. NOTE: This sheet is a summary. It may not cover all possible information. If you have questions about this medicine, talk to your doctor, pharmacist, or health care provider.  2020 Elsevier/Gold Standard (2018-05-09 15:43:58)  Monica Kaiser injection What is this medicine? FREMANEZUMAB (fre ma NEZ ue mab) is used to prevent migraine headaches. This medicine may be used for other purposes; ask your  health care provider or pharmacist if you have questions. COMMON BRAND NAME(S): AJOVY What should I tell my health care provider before I take this medicine? They need to know if you have any of these conditions:  an unusual or allergic reaction to fremanezumab, other medicines, foods, dyes, or preservatives  pregnant or trying to get pregnant  breast-feeding How  should I use this medicine? This medicine is for injection under the skin. You will be taught how to prepare and give this medicine. Use exactly as directed. Take your medicine at regular intervals. Do not take your medicine more often than directed. It is important that you put your used needles and syringes in a special sharps container. Do not put them in a trash can. If you do not have a sharps container, call your pharmacist or healthcare provider to get one. Talk to your pediatrician regarding the use of this medicine in children. Special care may be needed. Overdosage: If you think you have taken too much of this medicine contact a poison control center or emergency room at once. NOTE: This medicine is only for you. Do not share this medicine with others. What if I miss a dose? If you miss a dose, take it as soon as you can. If it is almost time for your next dose, take only that dose. Do not take double or extra doses. What may interact with this medicine? Interactions are not expected. This list may not describe all possible interactions. Give your health care provider a list of all the medicines, herbs, non-prescription drugs, or dietary supplements you use. Also tell them if you smoke, drink alcohol, or use illegal drugs. Some items may interact with your medicine. What should I watch for while using this medicine? Tell your doctor or healthcare professional if your symptoms do not start to get better or if they get worse. What side effects may I notice from receiving this medicine? Side effects that you should report to your doctor or health care professional as soon as possible:  allergic reactions like skin rash, itching or hives, swelling of the face, lips, or tongue Side effects that usually do not require medical attention (report these to your doctor or health care professional if they continue or are bothersome):  pain, redness, or irritation at site where injected This list  may not describe all possible side effects. Call your doctor for medical advice about side effects. You may report side effects to FDA at 1-800-FDA-1088. Where should I keep my medicine? Keep out of the reach of children. You will be instructed on how to store this medicine. Throw away any unused medicine after the expiration date on the label. NOTE: This sheet is a summary. It may not cover all possible information. If you have questions about this medicine, talk to your doctor, pharmacist, or health care provider.  2020 Elsevier/Gold Standard (2016-09-21 17:22:56)  Galcanezumab injection What is this medicine? GALCANEZUMAB (gal ka NEZ ue mab) is used to prevent migraines and treat cluster headaches. This medicine may be used for other purposes; ask your health care provider or pharmacist if you have questions. COMMON BRAND NAME(S): Emgality What should I tell my health care provider before I take this medicine? They need to know if you have any of these conditions:  an unusual or allergic reaction to galcanezumab, other medicines, foods, dyes, or preservatives  pregnant or trying to get pregnant  breast-feeding How should I use this medicine? This medicine is for injection under  the skin. You will be taught how to prepare and give this medicine. Use exactly as directed. Take your medicine at regular intervals. Do not take your medicine more often than directed. It is important that you put your used needles and syringes in a special sharps container. Do not put them in a trash can. If you do not have a sharps container, call your pharmacist or healthcare provider to get one. Talk to your pediatrician regarding the use of this medicine in children. Special care may be needed. Overdosage: If you think you have taken too much of this medicine contact a poison control center or emergency room at once. NOTE: This medicine is only for you. Do not share this medicine with others. What if I miss  a dose? If you miss a dose, take it as soon as you can. If it is almost time for your next dose, take only that dose. Do not take double or extra doses. What may interact with this medicine? Interactions are not expected. This list may not describe all possible interactions. Give your health care provider a list of all the medicines, herbs, non-prescription drugs, or dietary supplements you use. Also tell them if you smoke, drink alcohol, or use illegal drugs. Some items may interact with your medicine. What should I watch for while using this medicine? Tell your doctor or healthcare professional if your symptoms do not start to get better or if they get worse. What side effects may I notice from receiving this medicine? Side effects that you should report to your doctor or health care professional as soon as possible:  allergic reactions like skin rash, itching or hives, swelling of the face, lips, or tongue Side effects that usually do not require medical attention (report these to your doctor or health care professional if they continue or are bothersome):  pain, redness, or irritation at site where injected This list may not describe all possible side effects. Call your doctor for medical advice about side effects. You may report side effects to FDA at 1-800-FDA-1088. Where should I keep my medicine? Keep out of the reach of children. You will be instructed on how to store this medicine. Throw away any unused medicine after the expiration date on the label. NOTE: This sheet is a summary. It may not cover all possible information. If you have questions about this medicine, talk to your doctor, pharmacist, or health care provider.  2020 Elsevier/Gold Standard (2017-06-09 12:03:23)

## 2019-11-17 ENCOUNTER — Encounter: Payer: Self-pay | Admitting: Internal Medicine

## 2019-11-17 LAB — D-DIMER, QUANTITATIVE: D-Dimer, Quant: <0.19 mcg/mL FEU (ref <0.50)

## 2019-11-17 LAB — IRON,TIBC AND FERRITIN PANEL
%SAT: 50 % (calc) — ABNORMAL HIGH (ref 16–45)
Ferritin: 28 ng/mL (ref 16–232)
Iron: 174 ug/dL (ref 40–190)
TIBC: 348 mcg/dL (calc) (ref 250–450)

## 2019-11-17 NOTE — Telephone Encounter (Signed)
-----   Message from Delorise Jackson, MD sent at 11/17/2019  8:01 AM EST ----- No elevated D dimer I think racing heart will need to be follow with cardiology and control anxiety Iron levels normal  %sat iron elevated if taking iron otc additional stop this please

## 2019-11-20 DIAGNOSIS — R002 Palpitations: Secondary | ICD-10-CM | POA: Insufficient documentation

## 2019-11-20 DIAGNOSIS — I493 Ventricular premature depolarization: Secondary | ICD-10-CM | POA: Insufficient documentation

## 2019-11-20 DIAGNOSIS — I491 Atrial premature depolarization: Secondary | ICD-10-CM | POA: Insufficient documentation

## 2019-11-20 DIAGNOSIS — G8929 Other chronic pain: Secondary | ICD-10-CM | POA: Insufficient documentation

## 2019-11-20 DIAGNOSIS — R000 Tachycardia, unspecified: Secondary | ICD-10-CM | POA: Insufficient documentation

## 2019-11-22 NOTE — Progress Notes (Signed)
Cardiology Office Note    Date:  11/23/2019   ID:  Monica Kaiser [birth year], MRN [id number]  PCP:  McLean-Scocuzza, Nino Glow, MD  Cardiologist:  Kathlyn Sacramento, MD  Electrophysiologist:  None   Chief Complaint: Palpitations  History of Present Illness:   Monica Kaiser is a 44 y.o. female with history of tachypalpitations, COVID-19 in 12/2018 not requiring hospital admission, migraines, and anxiety who presents for evaluation of palpitations.  She was evaluated by Dr. Fletcher Anon in 12/2018 at the request of her PCP for intermittent tachypalpitations.  At that time she reported her heart rate would occasionally go up to 195 bpm by her Apple Watch, even if she was not overexerting herself.  There was significant fluctuation of her heart rate.  She denied any chest pain or dyspnea.  There was no significant family history for premature coronary artery disease, arrhythmia, or sudden death.  She was noted to be a lifelong non-smoker and occasionally drank alcohol socially as well as 1 cup of coffee daily.  EKG was nonacute.  Zio patch in 12/2018 showed normal sinus rhythm with an average heart rate of 86 bpm.  Isolated PACs and PVCs were noted with a less than 1% burden.  Most of the patient triggered events correlated with sinus tachycardia.  The highest sinus tachycardia rate was around 200 bpm.  More recently, she was seen in her PCPs office on 11/16/2019 with intermittent palpitations and chest pain with associated fatigue.  At that time her heart rate was ranging from 55 to 182 bpm.  EKG not in Epic for review.  D-dimer was negative at that time.  She comes in today noting an increase in tachypalpitations along with some chest discomfort described as a squeezing sensation along the upper anterior chest wall radiating to the left side.  Symptoms are typically more noticeable when she is at work and have historically been attributed to increased stress in this setting.  She does note when she  wore her initial ZIO in 12/2018 she was diagnosed with Covid shortly thereafter and was unable to see what her heart rate and rhythm were doing while at work under stress.  She notes her Apple Watch has reported heart rates into the 180s bpm while at work.  No dizziness, presyncope, or syncope associated with her palpitations.  Her chest discomfort has been noticeable for the past couple of weeks and is intermittent in nature.  This discomfort seems to be improving over the past couple of days.  She drinks 1 cup of coffee daily and otherwise is without caffeinated products.  She knows that she does not drink enough water.  No tobacco or illicit substances.  Outside of work stress things have been stable.   Labs independently reviewed: 10/2019 - potassium 4.1, BUN 13, serum creatinine 0.53, albumin 4.0, AST/ALT normal, TC 136, TG 97, HDL 47, LDL 69, Hgb 12.9, PLT 268, TSH normal 09/2018 - A1c 5.5  Past Medical History:  Diagnosis Date  . Anxiety   . Fatigue   . Low back pain   . Migraine   . Miscarriage     Past Surgical History:  Procedure Laterality Date  . BREAST CYST EXCISION Right 2003   benign     Current Medications: Current Meds  Medication Sig  . Ascorbic Acid (VITAMIN C PO) Take by mouth.  . Butalbital-APAP-Caffeine (FIORICET PO) Take by mouth.  Marland Kitchen MAGNESIUM PO Take by mouth.  . Multiple Vitamins-Minerals (ZINC PO) Take by mouth.  Marland Kitchen  norgestimate-ethinyl estradiol (ORTHO-CYCLEN,SPRINTEC,PREVIFEM) 0.25-35 MG-MCG tablet Take by mouth.  . ondansetron (ZOFRAN-ODT) 8 MG disintegrating tablet Take 1 tablet (8 mg total) by mouth every 8 (eight) hours as needed for nausea or vomiting.  . Riboflavin (B2 PO) Take by mouth.  . SUMAtriptan (IMITREX) 25 MG tablet Take 1 tablet (25 mg total) by mouth daily as needed for migraine. Daily prn repeat in 2 hrs if needed. Max dose in 1 day 200 mg total  . VITAMIN D PO Take by mouth.    Allergies:   Patient has no known allergies.   Social  History   Socioeconomic History  . Marital status: Married    Spouse name: Not on file  . Number of children: Not on file  . Years of education: Not on file  . Highest education level: Not on file  Occupational History  . Not on file  Tobacco Use  . Smoking status: Never Smoker  . Smokeless tobacco: Never Used  Vaping Use  . Vaping Use: Never used  Substance and Sexual Activity  . Alcohol use: No  . Drug use: No  . Sexual activity: Yes  Other Topics Concern  . 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 Difficulty of Paying Living Expenses: Not on file  Food Insecurity:   . Worried About Charity fundraiser in the Last Year: Not on file  . Ran Out of Food in the Last Year: Not on file  Transportation Needs:   . Lack of Transportation (Medical): Not on file  . Lack of Transportation (Non-Medical): Not on file  Physical Activity:   . Days of Exercise per Week: Not on file  . Minutes of Exercise per Session: Not on file  Stress:   . Feeling of Stress : Not on file  Social Connections:   . Frequency of Communication with Friends and Family: Not on file  . Frequency of Social Gatherings with Friends and Family: Not on file  . Attends Religious Services: Not on file  . Active Member of Clubs or Organizations: Not on file  . Attends Archivist Meetings: Not on file  . Marital Status: Not on file     Family History:  The patient's family history includes Arthritis in her mother; Cancer in her father; Colon cancer (age of onset: 54) in her father; Diabetes in her mother; Hyperlipidemia in her mother; Hypertension in her mother; Lung cancer (age of onset: 12) in her father. There is no history of Breast cancer.  ROS:   Review of Systems  Constitutional: Positive for malaise/fatigue.  Negative for chills, diaphoresis, fever and weight loss.  HENT: Negative for congestion.   Eyes: Negative for discharge and redness.  Respiratory: Negative for cough, sputum production, shortness of breath and wheezing.   Cardiovascular: Positive for chest pain and palpitations. Negative for orthopnea, claudication, leg swelling and PND.  Skin: Negative for rash.  Neurological: Negative for dizziness, tingling, tremors, sensory change, speech change, focal weakness, loss of consciousness and weakness.  Endo/Heme/Allergies: Does not bruise/bleed easily.  Psychiatric/Behavioral: Negative for substance abuse. The patient is not nervous/anxious.   All other systems reviewed and are negative.    EKGs/Labs/Other Studies Reviewed:  Studies reviewed were summarized above. The additional studies were reviewed today:  Zio patch 12/2018: Normal sinus rhythm with an average heart rate of 86 bpm. Isolated PACs and PVCs with less than 1% burden. Most triggered events correlated with sinus tachycardia. Highest sinus tachycardia rate was around 200 bpm.   EKG:  EKG is ordered today.  The EKG ordered today demonstrates NSR, 79 bpm, normal axis, no acute ST-T changes  Recent Labs: 10/13/2019: ALT 11; BUN 13; Creatinine, Ser 0.53; Hemoglobin 12.9; Platelets 268.0; Potassium 4.1; Sodium 138; TSH 0.61  Recent Lipid Panel    Component Value Date/Time   CHOL 136 10/13/2019 0944   TRIG 97.0 10/13/2019 0944   HDL 47.80 10/13/2019 0944   CHOLHDL 3 10/13/2019 0944   VLDL 19.4 10/13/2019 0944   LDLCALC 69 10/13/2019 0944    PHYSICAL EXAM:    VS:  BP 118/88 (BP Location: Left Arm, Patient Position: Sitting, Cuff Size: Normal)   Pulse 79   Ht 5\' 6"  (1.676 m)   Wt 130 lb (59 kg)   LMP 10/26/2019 (Within Weeks)   SpO2 98%   BMI 20.98 kg/m   BMI: Body mass index is 20.98 kg/m.  Physical Exam Constitutional:      Appearance: She is well-developed.  HENT:     Head: Normocephalic and atraumatic.    Eyes:     General:        Right eye: No discharge.        Left eye: No discharge.  Neck:     Vascular: No JVD.  Cardiovascular:     Rate and Rhythm: Normal rate and regular rhythm.     Pulses: No midsystolic click and no opening snap.          Dorsalis pedis pulses are 2+ on the right side and 2+ on the left side.       Posterior tibial pulses are 2+ on the right side and 2+ on the left side.     Heart sounds: Normal heart sounds, S1 normal and S2 normal. Heart sounds not distant. No murmur heard.  No friction rub.  Pulmonary:     Effort: Pulmonary effort is normal. No respiratory distress.     Breath sounds: Normal breath sounds. No decreased breath sounds, wheezing or rales.  Chest:     Chest wall: No tenderness.  Abdominal:     General: There is no distension.     Palpations: Abdomen is soft.     Tenderness: There is no abdominal tenderness.  Musculoskeletal:     Cervical back: Normal range of motion.  Skin:    General: Skin is warm and dry.     Nails: There is no clubbing.  Neurological:     Mental Status: She is alert and oriented to person, place, and time.  Psychiatric:        Speech: Speech normal.        Behavior: Behavior normal.        Thought Content: Thought content normal.        Judgment: Judgment normal.     Wt Readings from Last 3 Encounters:  11/23/19 130 lb (59 kg)  11/16/19 130 lb 6.4 oz (59.1 kg)  10/12/19 130 lb 3.2 oz (59.1 kg)     ASSESSMENT & PLAN:   1. Tachypalpitations: She notes a return of her tachypalpitations over the past several weeks which are similar to what she was experiencing in 12/2018 and more noticeable when she is under increased stress  at work.  Heart rates have been in the 180s bpm by her apple watch.  Her initial Zio patch was worn primarily while she was at home under quarantine in the setting of COVID-19.  She has multiple shifts scheduled at work in the upcoming future and therefore we have agreed to proceed with a repeat  Zio patch with further recommendations pending.  2. Chest pain: Overall symptoms appear to be atypical.  Given this, and lack of risk factors at this time we have agreed to proceed initially with a Zio patch as outlined above with further recommendations based on these results.  Disposition: F/u with Dr. Fletcher Anon or an APP in 6 weeks.   Medication Adjustments/Labs and Tests Ordered: Current medicines are reviewed at length with the patient today.  Concerns regarding medicines are outlined above. Medication changes, Labs and Tests ordered today are summarized above and listed in the Patient Instructions accessible in Encounters.   Signed, Christell Faith, PA-C 11/23/2019 2:07 PM     [facility] [address] [place], [place] [address] [phone]

## 2019-11-23 ENCOUNTER — Other Ambulatory Visit: Payer: Self-pay

## 2019-11-23 ENCOUNTER — Ambulatory Visit (INDEPENDENT_AMBULATORY_CARE_PROVIDER_SITE_OTHER): Payer: 59

## 2019-11-23 ENCOUNTER — Ambulatory Visit: Payer: 59 | Admitting: Physician Assistant

## 2019-11-23 ENCOUNTER — Encounter: Payer: Self-pay | Admitting: Physician Assistant

## 2019-11-23 VITALS — BP 118/88 | HR 79 | Ht 66.0 in | Wt 130.0 lb

## 2019-11-23 DIAGNOSIS — R002 Palpitations: Secondary | ICD-10-CM

## 2019-11-23 DIAGNOSIS — R0789 Other chest pain: Secondary | ICD-10-CM

## 2019-11-23 NOTE — Patient Instructions (Signed)
Medication Instructions:   1. Your physician recommends that you continue on your current medications as directed. Please refer to the Current Medication list given to you today.  *If you need a refill on your cardiac medications before your next appointment, please call your pharmacy*   Lab Work:  1. None Ordered  If you have labs (blood work) drawn today and your tests are completely normal, you will receive your results only by: Marland Kitchen MyChart Message (if you have MyChart) OR . A paper copy in the mail If you have any lab test that is abnormal or we need to change your treatment, we will call you to review the results.   Testing/Procedures:  1. Your physician has recommended that you wear a Zio monitor. This monitor is a medical device that records the heart's electrical activity. Doctors most often use these monitors to diagnose arrhythmias. Arrhythmias are problems with the speed or rhythm of the heartbeat. The monitor is a small device applied to your chest. You can wear one while you do your normal daily activities. While wearing this monitor if you have any symptoms to push the button and record what you felt. Once you have worn this monitor for the period of time provider prescribed (Usually 14 days), you will return the monitor device in the postage paid box. Once it is returned they will download the data collected and provide [place] with a report which the provider will then review and we will call you with those results. Important tips:  1. Avoid showering during the first 24 hours of wearing the monitor. 2. Avoid excessive sweating to help maximize wear time. 3. Do not submerge the device, no hot tubs, and no swimming pools. 4. Keep any lotions or oils away from the patch. 5. After 24 hours you may shower with the patch on. Take brief showers with your back facing the shower head.  6. Do not remove patch once it has been placed because that will interrupt data and decrease adhesive  wear time. 7. Push the button when you have any symptoms and write down what you were feeling. 8. Once you have completed wearing your monitor, remove and place into box which has postage paid and place in your outgoing mailbox.  9. If for some reason you have misplaced your box then call our office and we can provide another box and/or mail it off for you.          Follow-Up: At [facility], you and your health needs are our priority.  As part of our continuing mission to provide you with exceptional heart care, we have created designated Provider Care Teams.  These Care Teams include your primary Cardiologist (physician) and Advanced Practice Providers (APPs -  Physician Assistants and Nurse Practitioners) who all work together to provide you with the care you need, when you need it.  We recommend signing up for the patient portal called "MyChart".  Sign up information is provided on this After Visit Summary.  MyChart is used to connect with patients for Virtual Visits (Telemedicine).  Patients are able to view lab/test results, encounter notes, upcoming appointments, etc.  Non-urgent messages can be sent to your provider as well.   To learn more about what you can do with MyChart, go to [email or web address].    Your next appointment:   6 week(s)  The format for your next appointment:   In Person  Provider:   You may see Kathlyn Sacramento, MD or one of  the following Advanced Practice Providers on your designated Care Team:    Christell Faith, [place]

## 2019-12-07 ENCOUNTER — Encounter: Payer: Self-pay | Admitting: Internal Medicine

## 2019-12-22 DIAGNOSIS — R002 Palpitations: Secondary | ICD-10-CM | POA: Diagnosis not present

## 2020-01-01 ENCOUNTER — Telehealth: Payer: Self-pay

## 2020-01-01 NOTE — Telephone Encounter (Signed)
-----   Message from Sondra Barges, PA-C sent at 01/01/2020  8:53 AM EST ----- Please advise patient heart monitor showed sinus rhythm with an average heart rate of 88 bpm.  There were rare PACs and PVCs.  No significant arrhythmias were noted.  Patient triggered events did not correspond with arrhythmia.  Overall, this is a reassuring study.

## 2020-01-01 NOTE — Telephone Encounter (Signed)
Able to reach pt regarding her recent Zio monitor, Christell Faith, PA-C had a chance to review her results and advised   "Please advise patient heart monitor showed sinus rhythm with an average heart rate of 88 bpm.  There were rare PACs and PVCs.  No significant arrhythmias were noted.  Patient triggered events did not correspond with arrhythmia.  Overall, this is a reassuring study."  Pt verbalize understanding, is pleaseable with results, will keep appt with Dunn, PA-C 01/11/2020 at 08:30am, otherwise all questions or concerns were address and no additional concerns at this time. Agreeable to plan, will call back for anything further.

## 2020-01-03 NOTE — Progress Notes (Deleted)
Cardiology Office Note    Date:  01/03/2020   ID:  Monica, Kaiser 1975-11-24, MRN [id number]  PCP:  McLean-Scocuzza, Monica Spillers, MD  Cardiologist:  [name], MD  Electrophysiologist:  None   Chief Complaint: Follow up  History of Present Illness:   Monica Kaiser is a 44 y.o. female with history of tachypalpitations, COVID-19 in 12/2018 not requiring hospital admission, migraines, and anxiety who presents for follow up of recent Zio patch.  She was evaluated by Dr. Kirke Corin in 12/2018 at the request of her PCP for intermittent tachypalpitations.  At that time she reported her heart rate would occasionally go up to 195 bpm by her Apple Watch, even if she was not overexerting herself.  There was significant fluctuation of her heart rate.  She denied any chest pain or dyspnea.  There was no significant family history for premature coronary artery disease, arrhythmia, or sudden death.  She was noted to be a lifelong non-smoker and occasionally drank alcohol socially as well as 1 cup of coffee daily.  EKG was nonacute.  Zio patch in 12/2018 showed normal sinus rhythm with an average heart rate of 86 bpm.  Isolated PACs and PVCs were noted with a less than 1% burden.  Most of the patient triggered events correlated with sinus tachycardia.  The highest sinus tachycardia rate was around 200 bpm.  More recently, she was seen in her PCPs office on 11/16/2019 with intermittent palpitations and chest pain with associated fatigue.  At that time her heart rate was ranging from 55 to 182 bpm.  EKG not in Epic for review.  D-dimer was negative at that time.  She was seen in the office on 11/23/2019 noting an increase in tachypalpitations along with some chest discomfort with symptoms typically more noticeable while at work and had historically been attributed to increased stress in this setting.  She noted her apple watch reported heart rates into the 180s bpm while at work.  Repeat Zio patch showed a  predominant rhythm of sinus with an average heart rate of 88 bpm (range 47-179 bpm), rare PACs and PVCs, patient triggered events did not correlate with arrhythmia, and no significant arrhythmias.  ***   Labs independently reviewed: 10/2019 - potassium 4.1, BUN 13, serum creatinine 0.53, albumin 4.0, AST/ALT normal, TC 136, TG 97, HDL 47, LDL 69, Hgb 12.9, PLT 268, TSH normal 09/2018 - A1c 5.5  Past Medical History:  Diagnosis Date  . Anxiety   . Fatigue   . Low back pain   . Migraine   . Miscarriage     Past Surgical History:  Procedure Laterality Date  . BREAST CYST EXCISION Right 2003   benign     Current Medications: No outpatient medications have been marked as taking for the 01/11/20 encounter (Appointment) with Sondra Barges, PA-C.    Allergies:   Patient has no known allergies.   Social History   Socioeconomic History  . Marital status: Married    Spouse name: Not on file  . Number of children: Not on file  . Years of education: Not on file  . Highest education level: Not on file  Occupational History  . Not on file  Tobacco Use  . Smoking status: Never Smoker  . Smokeless tobacco: Never Used  Vaping Use  . Vaping Use: Never used  Substance and Sexual Activity  . Alcohol use: No  . Drug use: No  . Sexual activity: Yes  Other Topics  Concern  . 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 There is no history of Breast cancer.  ROS:   ROS   EKGs/Labs/Other Studies Reviewed:    Studies reviewed were summarized above. The additional studies were reviewed today:  14-day Zio patch 11/2019: Normal sinus rhythm with an average heart rate of 88 bpm. Rare PACs and rare PVCs. Triggered events did not correlate with arrhythmia. No significant arrhythmia. __________  2-week Zio patch 12/2018: Normal sinus rhythm with an average heart rate of 86 bpm. Isolated PACs and PVCs with less than 1% burden. Most triggered events correlated with sinus tachycardia. Highest sinus tachycardia rate was around 200 bpm.   EKG:  EKG is ordered today.  The EKG ordered today demonstrates ***  Recent Labs: 10/13/2019: ALT 11; BUN 13; Creatinine, Ser 0.53; Hemoglobin 12.9; Platelets 268.0; Potassium 4.1; Sodium 138; TSH 0.61  Recent Lipid Panel    Component Value Date/Time   CHOL 136 10/13/2019 0944   TRIG 97.0 10/13/2019 0944   HDL 47.80 10/13/2019 0944   CHOLHDL 3 10/13/2019 0944   VLDL 19.4 10/13/2019 0944   LDLCALC 69 10/13/2019 0944    PHYSICAL EXAM:    VS:  There were no vitals taken for this visit.  BMI: There is no height or weight on file to calculate BMI.  Physical Exam  Wt Readings from Last 3 Encounters:  11/23/19 130 lb (59 kg)  11/16/19 130 lb 6.4 oz (59.1 kg)  10/12/19 130 lb 3.2 oz (59.1 kg)     ASSESSMENT & PLAN:   1. Tachypalpitations: ***  2. Chest pain:  Disposition: F/u with Dr. Fletcher Anon or an APP in ***.   Medication Adjustments/Labs and Tests Ordered: Current medicines are reviewed at length with the patient today.  Concerns regarding medicines are outlined above. Medication changes, Labs and Tests ordered today are summarized above and listed in the Patient Instructions accessible in Encounters.   Signed, Christell Faith, PA-C 01/03/2020 3:00 PM     [facility]  [place], [place] [address] [phone]

## 2020-01-11 ENCOUNTER — Ambulatory Visit: Payer: 59 | Admitting: Physician Assistant

## 2020-01-22 DIAGNOSIS — Z20822 Contact with and (suspected) exposure to covid-19: Secondary | ICD-10-CM | POA: Diagnosis not present

## 2020-01-22 DIAGNOSIS — Z03818 Encounter for observation for suspected exposure to other biological agents ruled out: Secondary | ICD-10-CM | POA: Diagnosis not present

## 2020-01-25 DIAGNOSIS — Z20822 Contact with and (suspected) exposure to covid-19: Secondary | ICD-10-CM | POA: Diagnosis not present

## 2020-01-25 DIAGNOSIS — Z03818 Encounter for observation for suspected exposure to other biological agents ruled out: Secondary | ICD-10-CM | POA: Diagnosis not present

## 2020-01-27 ENCOUNTER — Encounter: Payer: Self-pay | Admitting: Internal Medicine

## 2020-05-16 ENCOUNTER — Ambulatory Visit: Payer: 59 | Admitting: Internal Medicine

## 2020-07-03 ENCOUNTER — Ambulatory Visit: Payer: BC Managed Care – PPO | Admitting: Internal Medicine

## 2020-07-03 ENCOUNTER — Other Ambulatory Visit: Payer: Self-pay

## 2020-07-03 ENCOUNTER — Ambulatory Visit (INDEPENDENT_AMBULATORY_CARE_PROVIDER_SITE_OTHER): Payer: BC Managed Care – PPO

## 2020-07-03 ENCOUNTER — Encounter: Payer: Self-pay | Admitting: Internal Medicine

## 2020-07-03 VITALS — BP 106/70 | HR 65 | Temp 98.2°F | Ht 66.0 in | Wt 131.0 lb

## 2020-07-03 DIAGNOSIS — K581 Irritable bowel syndrome with constipation: Secondary | ICD-10-CM

## 2020-07-03 DIAGNOSIS — K5900 Constipation, unspecified: Secondary | ICD-10-CM

## 2020-07-03 DIAGNOSIS — K5909 Other constipation: Secondary | ICD-10-CM | POA: Diagnosis not present

## 2020-07-03 MED ORDER — LINACLOTIDE 145 MCG PO CAPS: 145.0000 ug | ORAL_CAPSULE | Freq: Every day | ORAL | 0 refills | Status: DC

## 2020-07-03 NOTE — Patient Instructions (Addendum)
Align or culturelle probiotics daily   Avoid dairy for now   Warm prune   Consider miralax daily as needed in 8 ounces of liquid + colace 100-200 mg daily as needed   Or senna + colace   Low-FODMAP Eating Plan  FODMAP stands for fermentable oligosaccharides, disaccharides, monosaccharides, and polyols. These are sugars that are hard for some people to digest. A low-FODMAP eating plan may help some people who have irritable bowel syndrome (IBS) and certain other bowel (intestinal) diseases to manage their symptoms. This meal plan can be complicated to follow. Work with a diet and nutrition specialist (dietitian) to make a low-FODMAP eating plan that is right for you. A dietitian can helpmake sure that you get enough nutrition from this diet. What are tips for following this plan? Reading food labels Check labels for hidden FODMAPs such as: High-fructose syrup. Honey. Agave. Natural fruit flavors. Onion or garlic powder. Choose low-FODMAP foods that contain 3-4 grams of fiber per serving. Check food labels for serving sizes. Eat only one serving at a time to make sure FODMAP levels stay low. Shopping Shop with a list of foods that are recommended on this diet and make a meal plan. Meal planning Follow a low-FODMAP eating plan for up to 6 weeks, or as told by your health care provider or dietitian. To follow the eating plan: Eliminate high-FODMAP foods from your diet completely. Choose only low-FODMAP foods to eat. You will do this for 2-6 weeks. Gradually reintroduce high-FODMAP foods into your diet one at a time. Most people should wait a few days before introducing the next new high-FODMAP food into their meal plan. Your dietitian can recommend how quickly you may reintroduce foods. Keep a daily record of what and how much you eat and drink. Make note of any symptoms that you have after eating. Review your daily record with a dietitian regularly to identify which foods you can eat and  which foods you should avoid. General tips Drink enough fluid each day to keep your urine pale yellow. Avoid processed foods. These often have added sugar and may be high in FODMAPs. Avoid most dairy products, whole grains, and sweeteners. Work with a dietitian to make sure you get enough fiber in your diet. Avoid high FODMAP foods at meals to manage symptoms. Recommended foods Fruits Bananas, oranges, tangerines, lemons, limes, blueberries, raspberries, strawberries, grapes, cantaloupe, honeydew melon, kiwi, papaya, passion fruit, and pineapple. Limited amounts of dried cranberries, banana chips, and shreddedcoconut. Vegetables Eggplant, zucchini, cucumber, peppers, green beans, bean sprouts, lettuce, arugula, kale, Swiss chard, spinach, collard greens, bok choy, summer squash, potato, and tomato. Limited amounts of corn, carrot, and sweet potato. Greenparts of scallions. Grains Gluten-free grains, such as rice, oats, buckwheat, quinoa, corn, polenta, andmillet. Gluten-free pasta, bread, or cereal. Rice noodles. Corn tortillas. Meats and other proteins Unseasoned beef, pork, poultry, or fish. Eggs. Berniece Salines. Tofu (firm) and tempeh. Limited amounts of nuts and seeds, such as almonds, walnuts, [place] nuts,pecans, peanuts, nut butters, pumpkin seeds, chia seeds, and sunflower seeds. Dairy Lactose-free milk, yogurt, and kefir. Lactose-free cottage cheese and ice cream. Non-dairy milks, such as almond, coconut, hemp, and rice milk. Non-dairy yogurt. Limited amounts of goat cheese, brie, mozzarella, parmesan, swiss, andother hard cheeses. Fats and oils Butter-free spreads. Vegetable oils, such as olive, canola, and sunflower oil. Seasoning and other foods Artificial sweeteners with names that do not end in "ol," such as aspartame, saccharine, and stevia. Maple syrup, white table sugar, raw sugar, brown sugar, and  molasses. Mayonnaise, soy sauce, and tamari. Fresh basil, coriander,parsley, rosemary, and  thyme. Beverages Water and mineral water. Sugar-sweetened soft drinks. Small amounts of orangejuice or cranberry juice. Black and green tea. Most dry wines. Coffee. The items listed above may not be a complete list of foods and beverages you can eat. Contact a dietitian for more information. Foods to avoid Fruits Fresh, dried, and juiced forms of apple, pear, watermelon, peach, plum, cherries, apricots, blackberries, boysenberries, figs, nectarines, and mango.Avocado. Vegetables Chicory root, artichoke, asparagus, cabbage, snow peas, Brussels sprouts, broccoli, sugar snap peas, mushrooms, celery, and cauliflower. Onions, garlic,leeks, and the white part of scallions. Grains Wheat, including kamut, durum, and semolina. Barley and bulgur. Couscous.Wheat-based cereals. Wheat noodles, bread, crackers, and pastries. Meats and other proteins Fried or fatty meat. Sausage. Cashews and pistachios. Soybeans, baked beans, black beans, chickpeas, kidney beans, fava beans, navy beans, lentils,black-eyed peas, and split peas. Dairy Milk, yogurt, ice cream, and soft cheese. Cream and sour cream. Milk-basedsauces. Custard. Buttermilk. Soy milk. Seasoning and other foods Any sugar-free gum or candy. Foods that contain artificial sweeteners such as sorbitol, mannitol, isomalt, or xylitol. Foods that contain honey, high-fructose corn syrup, or agave. Bouillon, vegetable stock, beef stock, and chicken stock. Garlic and onion powder. Condiments made with onion, such ashummus, chutney, pickles, relish, salad dressing, and salsa. Tomato paste. Beverages Chicory-based drinks. Coffee substitutes. Chamomile tea. Fennel tea. Sweet or fortified wines such as port or sherry. Diet soft drinks made with isomalt, mannitol, maltitol, sorbitol, or xylitol. Apple, pear, and mango juice. Juiceswith high-fructose corn syrup. The items listed above may not be a complete list of foods and beverages you should avoid. Contact a dietitian  for more information. Summary FODMAP stands for fermentable oligosaccharides, disaccharides, monosaccharides, and polyols. These are sugars that are hard for some people to digest. A low-FODMAP eating plan is a short-term diet that helps to ease symptoms of certain bowel diseases. The eating plan usually lasts up to 6 weeks. After that, high-FODMAP foods are reintroduced gradually and one at a time. This can help you find out which foods may be causing symptoms. A low-FODMAP eating plan can be complicated. It is best to work with a dietitian who has experience with this type of plan. This information is not intended to replace advice given to you by your health care provider. Make sure you discuss any questions you have with your healthcare provider. Document Revised: 05/11/2019 Document Reviewed: 05/11/2019 Elsevier Patient Education  [place].  Constipation, Adult Constipation is when a person has fewer than three bowel movements in a week, has difficulty having a bowel movement, or has stools (feces) that are dry, hard, or larger than normal. Constipation may be caused by an underlying condition. It may become worse with age if a person takes certainmedicines and does not take in enough fluids. Follow these instructions at home: Eating and drinking  Eat foods that have a lot of fiber, such as beans, whole grains, and fresh fruits and vegetables. Limit foods that are low in fiber and high in fat and processed sugars, such as fried or sweet foods. These include french fries, hamburgers, cookies, candies, and soda. Drink enough fluid to keep your urine pale yellow.  General instructions Exercise regularly or as told by your health care provider. Try to do 150 minutes of moderate exercise each week. Use the bathroom when you have the urge to go. Do not hold it in. Take over-the-counter and prescription medicines only as told by your  health care provider. This includes any fiber  supplements. During bowel movements: Practice deep breathing while relaxing the lower abdomen. Practice pelvic floor relaxation. Watch your condition for any changes. Let your health care provider know about them. Keep all follow-up visits as told by your health care provider. This is important. Contact a health care provider if: You have pain that gets worse. You have a fever. You do not have a bowel movement after 4 days. You vomit. You are not hungry or you lose weight. You are bleeding from the opening between the buttocks (anus). You have thin, pencil-like stools. Get help right away if: You have a fever and your symptoms suddenly get worse. You leak stool or have blood in your stool. Your abdomen is bloated. You have severe pain in your abdomen. You feel dizzy or you faint. Summary Constipation is when a person has fewer than three bowel movements in a week, has difficulty having a bowel movement, or has stools (feces) that are dry, hard, or larger than normal. Eat foods that have a lot of fiber, such as beans, whole grains, and fresh fruits and vegetables. Drink enough fluid to keep your urine pale yellow. Take over-the-counter and prescription medicines only as told by your health care provider. This includes any fiber supplements. This information is not intended to replace advice given to you by your health care provider. Make sure you discuss any questions you have with your healthcare provider. Document Revised: 11/09/2018 Document Reviewed: 11/09/2018 Elsevier Patient Education  [place].

## 2020-07-09 DIAGNOSIS — K581 Irritable bowel syndrome with constipation: Secondary | ICD-10-CM | POA: Insufficient documentation

## 2020-07-09 DIAGNOSIS — K5909 Other constipation: Secondary | ICD-10-CM | POA: Insufficient documentation

## 2020-07-09 NOTE — Progress Notes (Signed)
Chief Complaint  Patient presents with   Constipation   F/u  Constipation x 2 weeks no changes in diet having ab discomfort last BM 4 days ago small output ho ibs constipation type and occasional left upper chest discomfort age 45 y.o had colonoscopy tried miralax, milk of mag, senna, pruce juice, colace and suppository w/o relief  Will do Xray today she is passing gas Review of Systems  Constitutional:  Negative for weight loss.  HENT:  Negative for hearing loss.   Eyes:  Negative for blurred vision.  Respiratory:  Negative for shortness of breath.   Cardiovascular:  Negative for chest pain.  Gastrointestinal:  Positive for constipation.  Musculoskeletal:  Negative for falls and joint pain.  Skin:  Negative for rash.  Psychiatric/Behavioral:  Negative for depression.   Past Medical History:  Diagnosis Date   Anxiety    Fatigue    Low back pain    Migraine    Miscarriage    Past Surgical History:  Procedure Laterality Date   BREAST CYST EXCISION Right 2003   benign    Family History  Problem Relation Age of Onset   Lung cancer Father 61   Colon cancer Father 35   Cancer Father        lung and colon cancer age 57   Arthritis Mother    Diabetes Mother    Hyperlipidemia Mother    Hypertension Mother    Breast cancer Neg Hx    Social History   Socioeconomic History   Marital status: Married    Spouse name: Not on file   Number of children: Not on file   Years of education: Not on file   Highest education level: Not on file  Occupational History   Not on file  Tobacco Use   Smoking status: Never   Smokeless tobacco: Never  Vaping Use   Vaping Use: Never used  Substance and Sexual Activity   Alcohol use: No   Drug use: No   Sexual activity: Yes  Other Topics Concern   Not on file  Social History Narrative   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   Butalbital-APAP-Caffeine (FIORICET PO) Take by mouth.   linaclotide (LINZESS) 145 MCG CAPS capsule Take 1 capsule (145 mcg total) by mouth daily before breakfast.   MAGNESIUM PO Take by mouth.   Multiple Vitamins-Minerals (ZINC PO) Take by mouth.   norgestimate-ethinyl estradiol (ORTHO-CYCLEN,SPRINTEC,PREVIFEM) 0.25-35 MG-MCG tablet Take by mouth.   ondansetron (ZOFRAN-ODT) 8 MG disintegrating tablet Take 1 tablet (8 mg total) by mouth every 8 (eight) hours as needed for nausea or vomiting.   Riboflavin (B2 PO) Take by mouth.   SUMAtriptan (IMITREX) 25 MG tablet Take 1 tablet (25 mg total) by mouth daily as needed for migraine. Daily prn repeat in 2 hrs if needed. Max dose in 1 day 200 mg total   VITAMIN D PO Take by mouth.   No  Known Allergies No results found for this or any previous visit (from the past 2160 hour(s)). Objective  Body mass index is 21.14 kg/m. Wt Readings from Last 3 Encounters:  07/03/20 131 lb (59.4 kg)  11/23/19 130 lb (59 kg)  11/16/19 130 lb 6.4 oz (59.1 kg)   Temp Readings from Last 3 Encounters:  07/03/20 98.2 F (36.8 C) (Oral)  11/16/19 98.1 F (36.7 C) (Oral)  10/12/19 98.2 F (36.8 C) (Oral)   BP Readings from Last 3 Encounters:  07/03/20 106/70  11/23/19 118/88  11/16/19 100/70   Pulse Readings from Last 3 Encounters:  07/03/20 65  11/23/19 79  11/16/19 71    Physical Exam Vitals and nursing note reviewed.  Constitutional:      Appearance: Normal appearance. She is well-developed and well-groomed.  HENT:     Head: Normocephalic and atraumatic.  Eyes:     Conjunctiva/sclera: Conjunctivae normal.     Pupils: Pupils are  equal, round, and reactive to light.  Cardiovascular:     Rate and Rhythm: Normal rate and regular rhythm.     Heart sounds: Normal heart sounds. No murmur heard. Pulmonary:     Effort: Pulmonary effort is normal.     Breath sounds: Normal breath sounds.  Abdominal:     Tenderness: There is generalized abdominal tenderness.  Skin:    General: Skin is warm and dry.  Neurological:     General: No focal deficit present.     Mental Status: She is alert and oriented to person, place, and time. Mental status is at baseline.     Gait: Gait normal.  Psychiatric:        Attention and Perception: Attention and perception normal.        Mood and Affect: Mood and affect normal.        Speech: Speech normal.        Behavior: Behavior normal. Behavior is cooperative.        Thought Content: Thought content normal.        Cognition and Memory: Cognition and memory normal.        Judgment: Judgment normal.   Assessment  Plan  Irritable bowel syndrome with constipation - Plan: linaclotide (LINZESS) 145 MCG CAPS capsule  Constipation, unspecified constipation type - Plan: DG Abd 1 View, linaclotide (LINZESS) 145 MCG CAPS capsule  Chronic constipation - Plan: linaclotide (LINZESS) 145 MCG CAPS capsule  Xray  FINDINGS: Normal abdominal gas pattern. Moderate stool within the ascending, descending, and rectosigmoid colon. No free intraperitoneal gas. No organomegaly. No abnormal abdominal calcification. No acute bone abnormality.   IMPRESSION: Normal abdominal gas pattern.  Moderate stool.     Electronically Signed   By: Fidela Salisbury MD   On: 07/05/2020 00:39   Consider GI referral pt wants to wait for now Provider: Dr. Olivia Mackie McLean-Scocuzza-Internal Medicine

## 2020-07-21 ENCOUNTER — Encounter: Payer: Self-pay | Admitting: Internal Medicine

## 2020-07-22 NOTE — Telephone Encounter (Signed)
Sent to [facility] urgently  Rx linzess 145 did she get this filled otherwise GI will have to Rx medication for constipation this is as far as PCP can do

## 2020-07-22 NOTE — Addendum Note (Signed)
Addended by: Orland Mustard on: 07/22/2020 11:24 AM   Modules accepted: Orders

## 2020-07-22 NOTE — Telephone Encounter (Signed)
Patient calling back in. She has tried milk of magnesia and enema, still having constipation and bloating. Patient having small bowel movements but not full movements.   She would like referral discussed at 07/03/20 appointment. Patient is okay with whoever is recommended but would like local if possible.   Patient will be out of town next week on the 23 rd for 7 days. If Patient can not be send urgently by Gi was wondering if there was something prescription strength that could be sent in to clean her out.

## 2020-07-22 NOTE — Telephone Encounter (Signed)
Sent to [facility] urgently Rx linzess 145 did she get this filled otherwise GI will have to Rx medication for constipation this is as far as PCP can do  She can take 2 linzess to equal 290 mcg if needed

## 2020-07-25 ENCOUNTER — Ambulatory Visit: Payer: BC Managed Care – PPO | Admitting: Gastroenterology

## 2020-07-25 ENCOUNTER — Encounter: Payer: Self-pay | Admitting: Family Medicine

## 2020-07-25 ENCOUNTER — Other Ambulatory Visit: Payer: Self-pay

## 2020-07-25 VITALS — BP 117/84 | HR 71 | Temp 98.1°F | Ht 66.0 in | Wt 131.6 lb

## 2020-07-25 DIAGNOSIS — K5900 Constipation, unspecified: Secondary | ICD-10-CM

## 2020-07-25 NOTE — Progress Notes (Signed)
Monica Kaiser [address]  [place]  [place], [place] [address]  Main: [phone]  Fax: [phone]   Gastroenterology Consultation  Referring Provider:     McLean-Scocuzza, Olivia Mackie * Primary Care Physician:  McLean-Scocuzza, Nino Glow, MD Reason for Consultation:     Constipation        HPI:    Chief Complaint  Patient presents with   New Patient (Initial Visit)   Constipation    Going on for many years.... most recently x 1 month abdomen feels bloated...  Denies N/V/D, denies blood in stools...BM once a week, small and firm... Pt has tried Miralax, Milk of magnesia, Colace, Sennacot., enema.. each just on 1-2 time use not daily...     Monica Kaiser is a 45 y.o. y/o female referred for consultation & management  by Dr. Terese Door, Nino Glow, MD. patient reports 1 month history of constipation associated with bloating and abdominal distention.  Goes a week without a bowel movement.  Has tried over-the-counter medications but only as needed and not daily before.  Has tried MiraLAX about once or twice as needed only.  She cannot drink milk of magnesia because of the taste.  Due to ongoing symptoms, she has tried suppository and enema as well that led to small bowel movements.  No blood in stool.  No weight loss.  No nausea or vomiting.  No dysphagia or heartburn.  No abdominal pain  States she had similar symptoms when she was 45 years old and had a colonoscopy at the time in [place] that was normal.  Besides these episodes, her a baseline bowel movements consist of 3 bowel movements every week that are formed, and she does not have to strain or push.  Reports family history of colon cancer in her father.  Linzess was prescribed by her primary care provider, notes reviewed, but patient has not picked this up from the pharmacy yet as she states she did not want to be on a daily medication  Past Medical History:  Diagnosis Date   Anxiety    Fatigue    Low back  pain    Migraine    Miscarriage     Past Surgical History:  Procedure Laterality Date   BREAST CYST EXCISION Right 2003   benign     Prior to Admission medications   Medication Sig Start Date End Date Taking? Authorizing Provider  Ascorbic Acid (VITAMIN C PO) Take by mouth.   Yes [provider]  MAGNESIUM PO Take by mouth.   Yes [provider]  Multiple Vitamins-Minerals (ZINC PO) Take by mouth.   Yes [provider]  norgestimate-ethinyl estradiol (ORTHO-CYCLEN,SPRINTEC,PREVIFEM) 0.25-35 MG-MCG tablet Take by mouth. 08/12/16  Yes [provider]  ondansetron (ZOFRAN-ODT) 8 MG disintegrating tablet Take 1 tablet (8 mg total) by mouth every 8 (eight) hours as needed for nausea or vomiting. 10/05/18  Yes McLean-Scocuzza, Nino Glow, MD  Riboflavin (B2 PO) Take by mouth.   Yes [provider]  SUMAtriptan (IMITREX) 25 MG tablet Take 1 tablet (25 mg total) by mouth daily as needed for migraine. Daily prn repeat in 2 hrs if needed. Max dose in 1 day 200 mg total 10/05/18  Yes McLean-Scocuzza, Nino Glow, MD  VITAMIN D PO Take by mouth.   Yes [provider]  linaclotide Rolan Lipa) 145 MCG CAPS capsule Take 1 capsule (145 mcg total) by mouth daily before breakfast. Patient not taking: Reported on 07/25/2020 07/03/20   McLean-Scocuzza, Olivia Mackie  N, MD    Family History  Problem Relation Age of Onset   Lung cancer Father 30   Colon cancer Father 39   Cancer Father        lung and colon cancer age 74   Arthritis Mother    Diabetes Mother    Hyperlipidemia Mother    Hypertension Mother    Breast cancer Neg Hx      Social History   Tobacco Use   Smoking status: Never   Smokeless tobacco: Never  Vaping Use   Vaping Use: Never used  Substance Use Topics   Alcohol use: No   Drug use: No    Allergies as of 07/25/2020   (No Known Allergies)    Review of Systems:    All systems reviewed and negative except where noted in HPI.   Physical  Exam:  Constitutional: General:   Alert,  Well-developed, well-nourished, pleasant and cooperative in NAD BP 117/84   Pulse 71   Temp 98.1 F (36.7 C) (Oral)   Ht 5\' 6"  (1.676 m)   Wt 131 lb 9.6 oz (59.7 kg)   LMP 07/03/2020 (Exact Date)   BMI 21.24 kg/m   Eyes:  Sclera clear, no icterus.   Conjunctiva pink. PERRLA  Ears:  No scars, lesions or masses, Normal auditory acuity. Nose:  No deformity, discharge, or lesions. Mouth:  No deformity or lesions, oropharynx pink & moist.  Neck:  Supple; no masses or thyromegaly.  Respiratory: Normal respiratory effort, Normal percussion  Gastrointestinal:  No bruits.  Soft, non-tender and non-distended without masses, hepatosplenomegaly or hernias noted.  No guarding or rebound tenderness.     Cardiac: No clubbing or edema.  No cyanosis. Normal posterior tibial pedal pulses noted.  Lymphatic:  No significant cervical or axillary adenopathy.  Psych:  Alert and cooperative. Normal mood and affect.  Musculoskeletal:  Normal gait. Head normocephalic, atraumatic. Symmetrical without gross deformities. 5/5 Upper and Lower extremity strength bilaterally.  Skin: Warm. Intact without significant lesions or rashes. No jaundice.  Neurologic:  Face symmetrical, tongue midline, Normal sensation to touch;  grossly normal neurologically.  Psych:  Alert and oriented x3, Alert and cooperative. Normal mood and affect.   Labs: CBC    Component Value Date/Time   WBC 7.4 10/13/2019 0944   RBC 4.08 10/13/2019 0944   HGB 12.9 10/13/2019 0944   HCT 37.3 10/13/2019 0944   PLT 268.0 10/13/2019 0944   MCV 91.5 10/13/2019 0944   MCH 31.5 06/24/2018 1737   MCHC 34.6 10/13/2019 0944   RDW 12.6 10/13/2019 0944   LYMPHSABS 1.8 10/13/2019 0944   MONOABS 0.5 10/13/2019 0944   EOSABS 0.1 10/13/2019 0944   BASOSABS 0.1 10/13/2019 0944   CMP     Component Value Date/Time   NA 138 10/13/2019 0944   K 4.1 10/13/2019 0944   CL 106 10/13/2019 0944   CO2 27  10/13/2019 0944   GLUCOSE 88 10/13/2019 0944   BUN 13 10/13/2019 0944   CREATININE 0.53 10/13/2019 0944   CALCIUM 9.0 10/13/2019 0944   PROT 6.7 10/13/2019 0944   ALBUMIN 4.0 10/13/2019 0944   AST 13 10/13/2019 0944   ALT 11 10/13/2019 0944   ALKPHOS 34 (L) 10/13/2019 0944   BILITOT 0.6 10/13/2019 0944   GFRNONAA >60 06/24/2018 1737   GFRAA >60 06/24/2018 1737    Imaging Studies:   Assessment and Plan:   RITHIKA SEEL is a 45 y.o. y/o female has been referred for constipation  Patient attributes some of her changes in bowel movements due to her changes in work schedule where she is working 12-hour shifts and sometimes she is not able to have a bowel movement at work when the sensation arises.  Given that she has not tried MiraLAX daily and only once or twice as needed, I will start with MiraLAX daily at this time  High-fiber diet MiraLAX daily with goal of 1-2 soft bowel movements daily.  If not at goal, patient instructed to increase dose to twice daily.  If loose stools with the medication, patient asked to decrease the medication to every other day, or half dose daily.  Patient verbalized understanding  Patient states she is going out of town for vacation for a week, starting next week and would like to not feel as bloated from constipation during the vacation.  I have advised her to take MiraLAX daily starting today and if after 3 to 4 days she has not seen any improvement in her bowel movements, to go ahead and start Linzess that has been prescribed to her and the prescription is available to her at her pharmacy.  If MiraLAX does not lead to improvement in symptoms, she can continue that daily instead of starting Linzess  I have also given her samples of Linzess 145 MCG a day, that will last her about 12 days, so she has samples to use on her vacation as well.  However, if MiraLAX causes bloating or discomfort and she is unable to continue the MiraLAX, patient can start  Linzess instead   I also discussed need for colonoscopy for family history of colon cancer in her father.  Patient is hesitant in scheduling at this time, and we can readdress on next visit again as well.  No alarm symptoms present such as blood in stool, weight loss to indicate urgent endoscopy  Labs reassuring with no anemia present or elevation liver enzymes.  Electrolytes normal   Dr Monica Kaiser  Speech recognition software was used to dictate the above note.

## 2020-07-25 NOTE — Patient Instructions (Signed)
1 week supply Linzess samples given to you.  Start Miralax daily and if you have not produced a bowel movement, start Linzess daily.  There is a coupon on [email or web address] that can possibly reduce to $30 co pay.  Follow up in 8-12 weeks

## 2020-08-15 ENCOUNTER — Encounter: Payer: Self-pay | Admitting: Internal Medicine

## 2020-08-16 ENCOUNTER — Other Ambulatory Visit: Payer: Self-pay

## 2020-08-16 ENCOUNTER — Ambulatory Visit
Admission: RE | Admit: 2020-08-16 | Discharge: 2020-08-16 | Disposition: A | Discharge status: Home / Self Care | Payer: BC Managed Care – PPO | Source: Ambulatory Visit | Attending: Internal Medicine | Admitting: Internal Medicine

## 2020-08-16 DIAGNOSIS — Z1231 Encounter for screening mammogram for malignant neoplasm of breast: Secondary | ICD-10-CM | POA: Insufficient documentation

## 2020-08-16 NOTE — Telephone Encounter (Signed)
Please advise 

## 2020-09-19 ENCOUNTER — Ambulatory Visit: Payer: BC Managed Care – PPO | Admitting: Gastroenterology

## 2020-10-11 ENCOUNTER — Encounter: Payer: 59 | Admitting: Internal Medicine

## 2020-10-18 ENCOUNTER — Encounter: Payer: 59 | Admitting: Internal Medicine

## 2021-06-12 IMAGING — CT CT ABDOMEN AND PELVIS WITH CONTRAST
2 of 6 series · 15 of 46 positions shown, 17 images · IV contrast (APPLIED)
Comparison: None.

CLINICAL DATA: Right flank pain for 6 weeks.

EXAM:
CT ABDOMEN AND PELVIS WITH CONTRAST
TECHNIQUE: Multidetector CT imaging of the abdomen and pelvis was performed
using the standard protocol following bolus administration of
intravenous contrast.
CONTRAST:  100mL OMNIPAQUE IOHEXOL 300 MG/ML  SOLN

[Series 3: thins · axial · 0.70mm/px · z∈[-471,-26]mm · 12 of 723 slices shown, 14 images]
[im 58/723  soft-tissue]
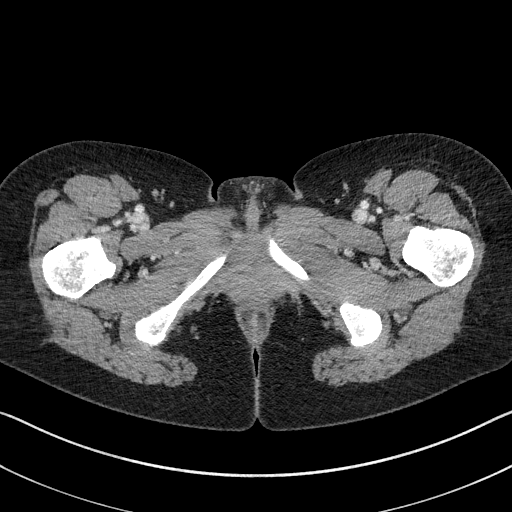
[im 58/723  bone]
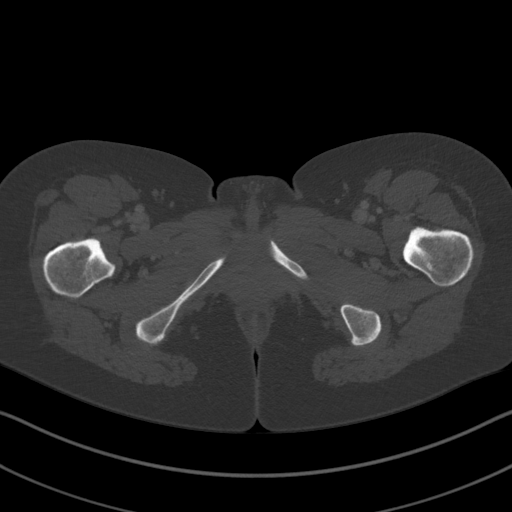
[im 116/723  soft-tissue]
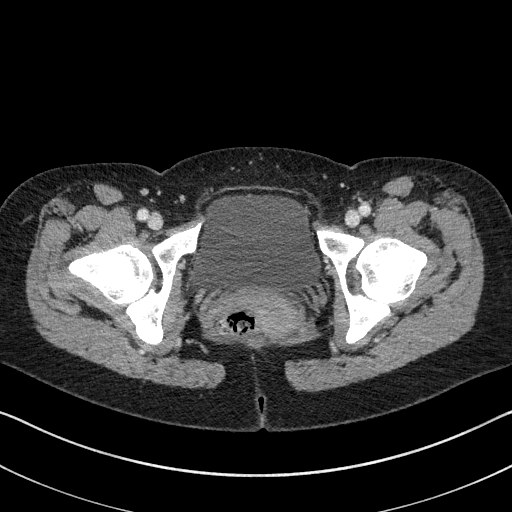
[im 174/723  soft-tissue]
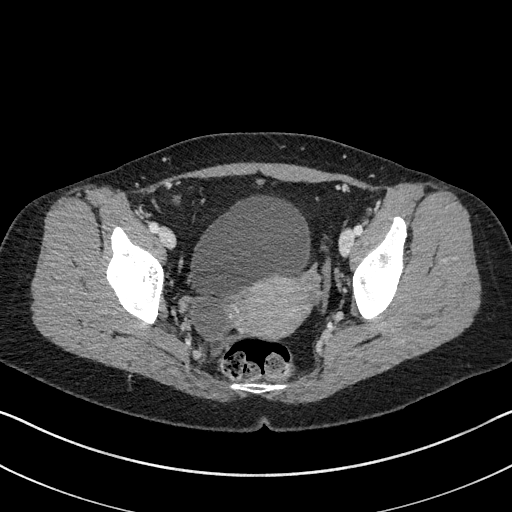
[im 232/723  soft-tissue]
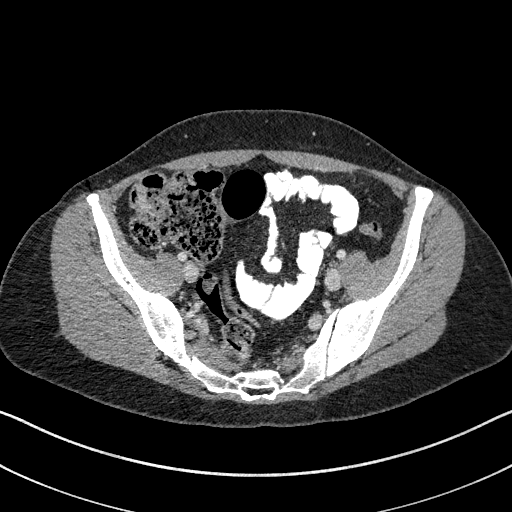
[im 289/723  soft-tissue]
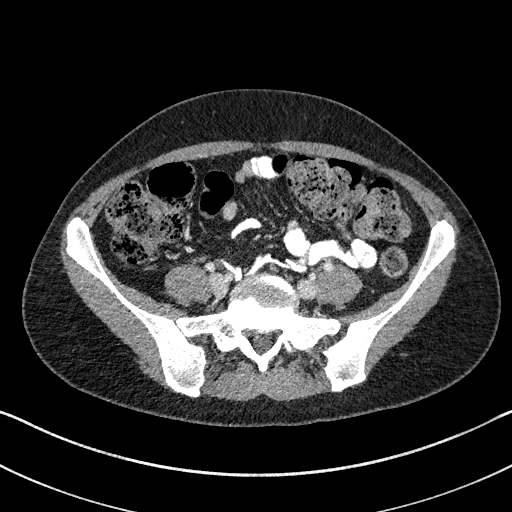
[im 347/723  soft-tissue]
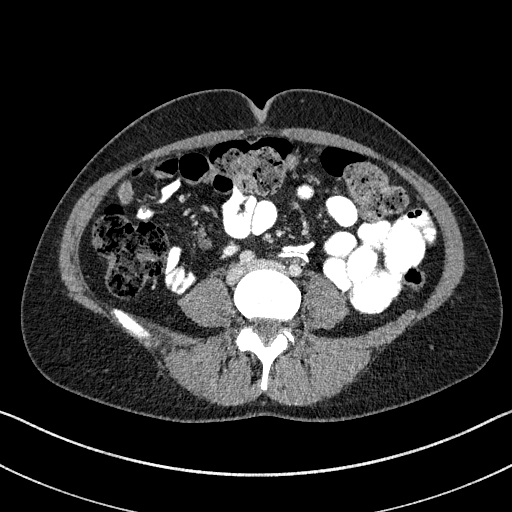
[im 405/723  soft-tissue]
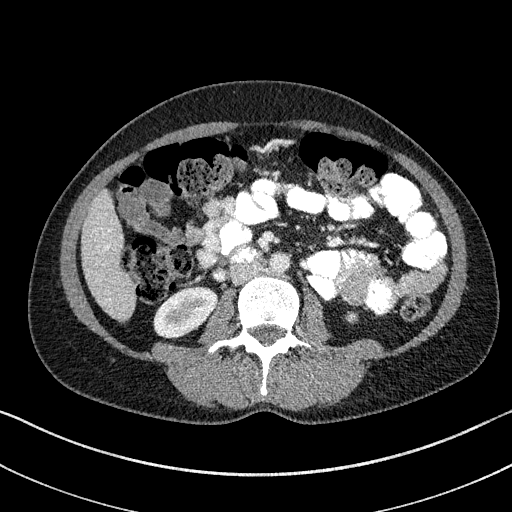
[im 463/723  soft-tissue]
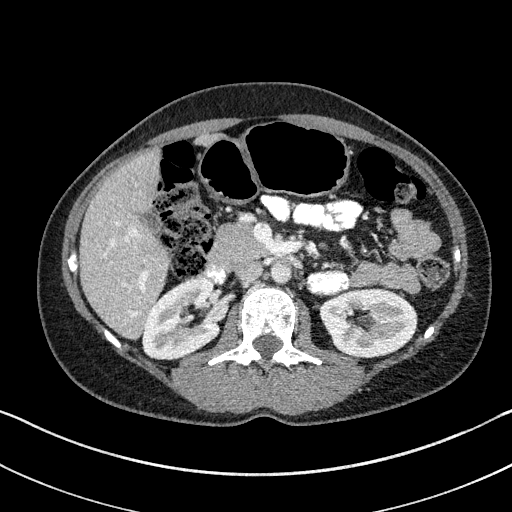
[im 520/723  soft-tissue]
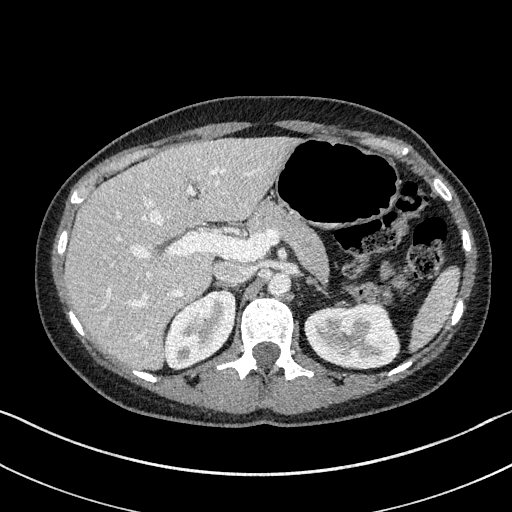
[im 520/723  bone]
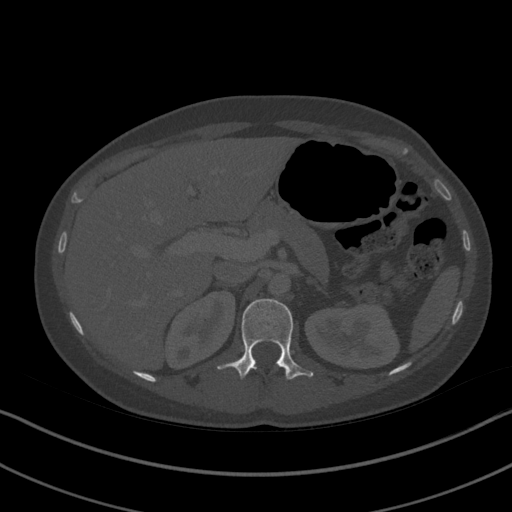
[im 578/723  soft-tissue]
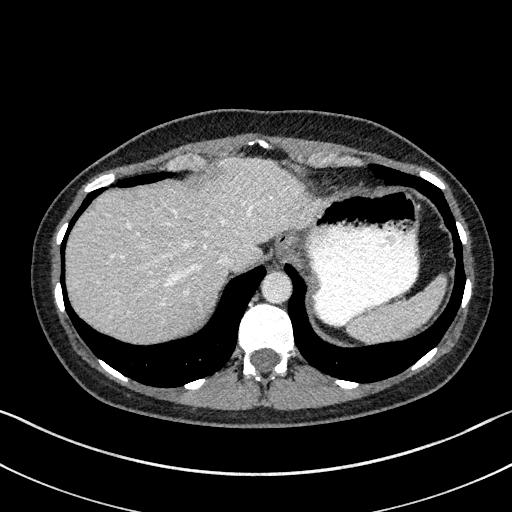
[im 636/723  soft-tissue]
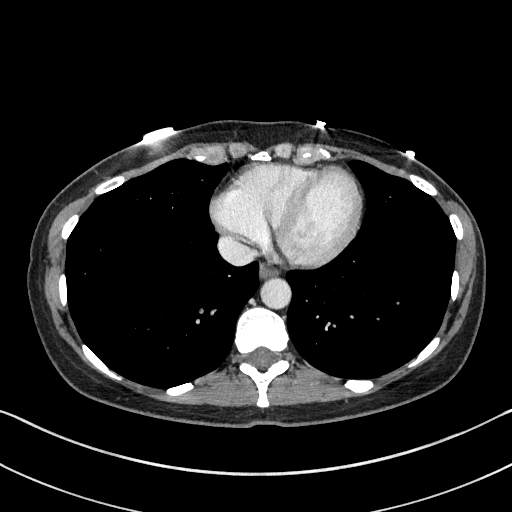
[im 694/723  soft-tissue]
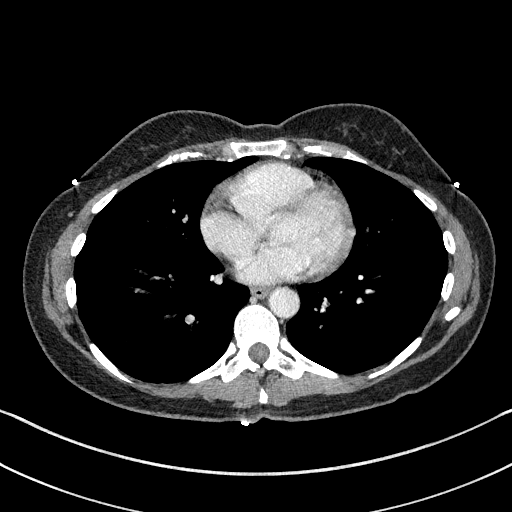

[Series 5: coronal st · coronal · 0.73mm/px · 3 of 80 slices shown]
[im 27/80  soft-tissue]
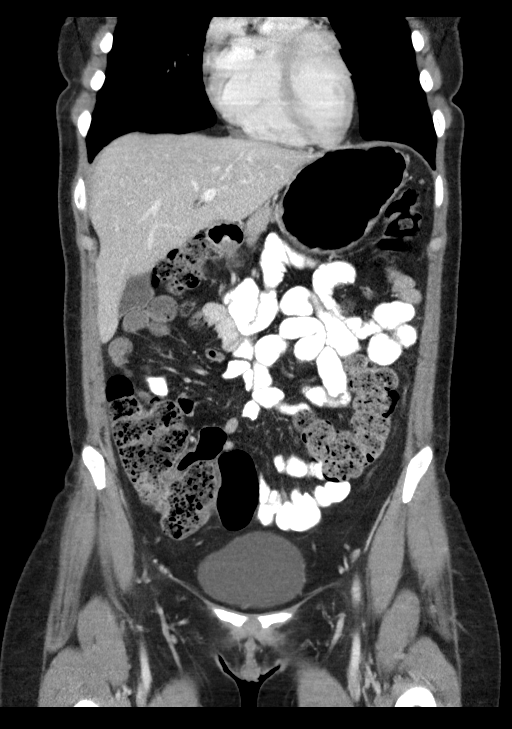
[im 36/80  soft-tissue]
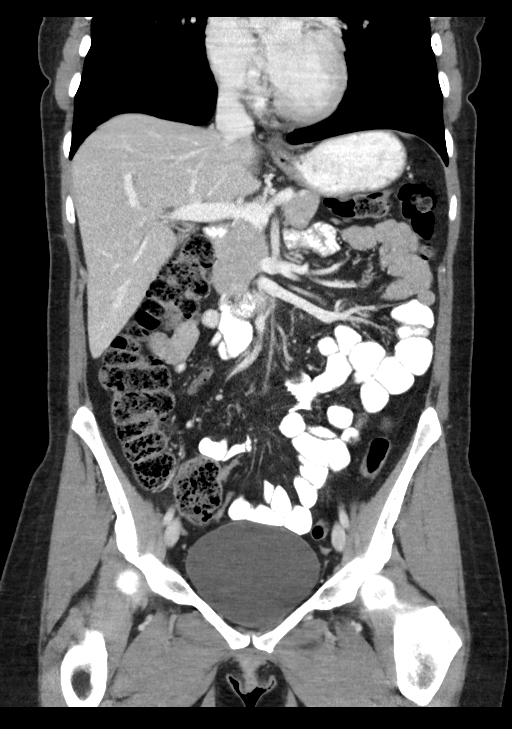
[im 44/80  soft-tissue]
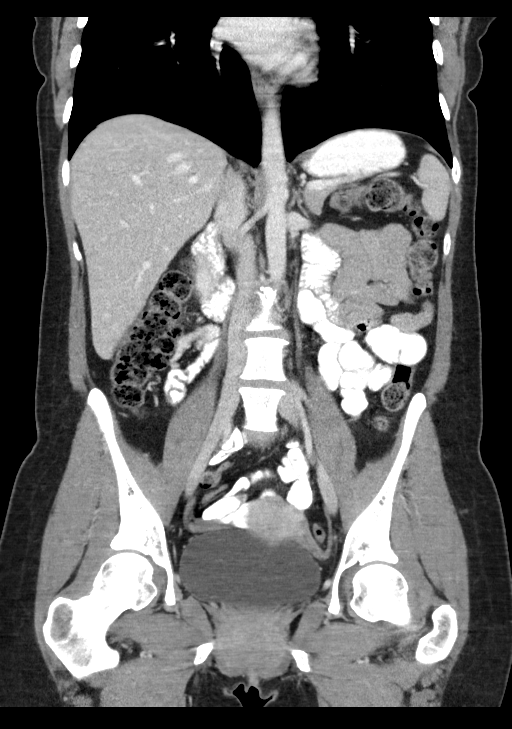

[15 of 46 positions shown; findings below may reference images not displayed]

FINDINGS: Lower chest: The lung bases are clear of acute process. No pleural
effusion or pulmonary lesions. The heart is normal in size. No
pericardial effusion. The distal esophagus and aorta are
unremarkable.

Hepatobiliary: No focal hepatic lesions or intrahepatic biliary
dilatation. The gallbladder is normal. No common bile duct
dilatation.

Pancreas: No mass, inflammation or ductal dilatation.

Spleen: Normal size.  No focal lesions.

Adrenals/Urinary Tract: The adrenal glands and kidneys are
unremarkable. No renal, ureteral or bladder calculi or mass.

Stomach/Bowel: The stomach, duodenum, small bowel and colon are
unremarkable. No acute inflammatory changes, mass lesions or
obstructive findings. The terminal ileum is normal. The appendix is
normal.

Vascular/Lymphatic: The aorta is normal in caliber. No dissection.
The branch vessels are patent. The major venous structures are
patent. No mesenteric or retroperitoneal mass or adenopathy. Small
scattered lymph nodes are noted.

Reproductive: The uterus and ovaries are unremarkable. There is a
simple appearing cyst associated with the right ovary.

Other: No pelvic mass or adenopathy. No free pelvic fluid
collections. No inguinal mass or adenopathy. No abdominal wall
hernia or subcutaneous lesions.

Musculoskeletal: No significant bony findings.
IMPRESSION: 1. No acute abdominal/pelvic findings, mass lesions or adenopathy.
2. No renal, ureteral or bladder calculi or mass.

## 2021-07-10 ENCOUNTER — Encounter: Payer: Self-pay | Admitting: Gastroenterology

## 2021-07-18 ENCOUNTER — Ambulatory Visit (INDEPENDENT_AMBULATORY_CARE_PROVIDER_SITE_OTHER): Payer: 59 | Admitting: Internal Medicine

## 2021-07-18 ENCOUNTER — Encounter: Payer: Self-pay | Admitting: Internal Medicine

## 2021-07-18 VITALS — BP 100/70 | HR 71 | Temp 98.1°F | Ht 66.0 in | Wt 136.4 lb

## 2021-07-18 DIAGNOSIS — E559 Vitamin D deficiency, unspecified: Secondary | ICD-10-CM

## 2021-07-18 DIAGNOSIS — Z1389 Encounter for screening for other disorder: Secondary | ICD-10-CM

## 2021-07-18 DIAGNOSIS — E538 Deficiency of other specified B group vitamins: Secondary | ICD-10-CM

## 2021-07-18 DIAGNOSIS — R5383 Other fatigue: Secondary | ICD-10-CM

## 2021-07-18 DIAGNOSIS — R140 Abdominal distension (gaseous): Secondary | ICD-10-CM

## 2021-07-18 DIAGNOSIS — Z1231 Encounter for screening mammogram for malignant neoplasm of breast: Secondary | ICD-10-CM

## 2021-07-18 DIAGNOSIS — Z13220 Encounter for screening for lipoid disorders: Secondary | ICD-10-CM | POA: Diagnosis not present

## 2021-07-18 DIAGNOSIS — E611 Iron deficiency: Secondary | ICD-10-CM | POA: Diagnosis not present

## 2021-07-18 DIAGNOSIS — Z0000 Encounter for general adult medical examination without abnormal findings: Secondary | ICD-10-CM | POA: Diagnosis not present

## 2021-07-18 LAB — IBC + FERRITIN
Ferritin: 20.8 ng/mL (ref 10.0–291.0)
Iron: 86 ug/dL (ref 42–145)
Saturation Ratios: 21.9 % (ref 20.0–50.0)
TIBC: 392 ug/dL (ref 250.0–450.0)
Transferrin: 280 mg/dL (ref 212.0–360.0)

## 2021-07-18 LAB — CBC WITH DIFFERENTIAL/PLATELET
Basophils Absolute: 0 10*3/uL (ref 0.0–0.1)
Basophils Relative: 0.9 % (ref 0.0–3.0)
Eosinophils Absolute: 0.1 10*3/uL (ref 0.0–0.7)
Eosinophils Relative: 1.3 % (ref 0.0–5.0)
HCT: 37.1 % (ref 36.0–46.0)
Hemoglobin: 12.9 g/dL (ref 12.0–15.0)
Lymphocytes Relative: 27.3 % (ref 12.0–46.0)
Lymphs Abs: 1.3 10*3/uL (ref 0.7–4.0)
MCHC: 34.7 g/dL (ref 30.0–36.0)
MCV: 91.6 fl (ref 78.0–100.0)
Monocytes Absolute: 0.5 10*3/uL (ref 0.1–1.0)
Monocytes Relative: 10.7 % (ref 3.0–12.0)
Neutro Abs: 2.8 10*3/uL (ref 1.4–7.7)
Neutrophils Relative %: 59.8 % (ref 43.0–77.0)
Platelets: 302 10*3/uL (ref 150.0–400.0)
RBC: 4.05 Mil/uL (ref 3.87–5.11)
RDW: 12.5 % (ref 11.5–15.5)
WBC: 4.6 10*3/uL (ref 4.0–10.5)

## 2021-07-18 LAB — LIPID PANEL
Cholesterol: 137 mg/dL (ref 0–200)
HDL: 46.5 mg/dL (ref >39.00)
LDL Cholesterol: 72 mg/dL (ref 0–99)
NonHDL: 90.69
Total CHOL/HDL Ratio: 3
Triglycerides: 91 mg/dL (ref 0.0–149.0)
VLDL: 18.2 mg/dL (ref 0.0–40.0)

## 2021-07-18 LAB — TSH: TSH: 0.9 u[IU]/mL (ref 0.35–5.50)

## 2021-07-18 LAB — COMPREHENSIVE METABOLIC PANEL
ALT: 15 U/L (ref 0–35)
AST: 18 U/L (ref 0–37)
Albumin: 4.3 g/dL (ref 3.5–5.2)
Alkaline Phosphatase: 39 U/L (ref 39–117)
BUN: 8 mg/dL (ref 6–23)
CO2: 27 mEq/L (ref 19–32)
Calcium: 9.3 mg/dL (ref 8.4–10.5)
Chloride: 104 mEq/L (ref 96–112)
Creatinine, Ser: 0.6 mg/dL (ref 0.40–1.20)
GFR: 108.79 mL/min (ref >60.00)
Glucose, Bld: 94 mg/dL (ref 70–99)
Potassium: 4.3 mEq/L (ref 3.5–5.1)
Sodium: 137 mEq/L (ref 135–145)
Total Bilirubin: 0.4 mg/dL (ref 0.2–1.2)
Total Protein: 7 g/dL (ref 6.0–8.3)

## 2021-07-18 LAB — VITAMIN D 25 HYDROXY (VIT D DEFICIENCY, FRACTURES): VITD: 48.54 ng/mL (ref 30.00–100.00)

## 2021-07-18 LAB — VITAMIN B12: Vitamin B-12: 508 pg/mL (ref 211–911)

## 2021-07-18 NOTE — Progress Notes (Signed)
No chief complaint on file.  Annual  1. Chronic fatigue  2. ST on apple watch hr 47-192, HR 62-178     Review of Systems  Constitutional:  Negative for weight loss.  HENT:  Negative for hearing loss.   Eyes:  Negative for blurred vision.  Respiratory:  Negative for shortness of breath.   Cardiovascular:  Negative for chest pain.  Gastrointestinal:  Negative for abdominal pain and blood in stool.  Genitourinary:  Negative for dysuria.  Musculoskeletal:  Negative for falls and joint pain.  Skin:  Negative for rash.  Neurological:  Negative for headaches.  Psychiatric/Behavioral:  Negative for depression.    Past Medical History:  Diagnosis Date   Anxiety    Fatigue    Low back pain    Migraine    Miscarriage    Past Surgical History:  Procedure Laterality Date   BREAST CYST EXCISION Right 2003   benign    Family History  Problem Relation Age of Onset   Lung cancer Father 54   Colon cancer Father 82   Cancer Father        lung and colon cancer age 76   Arthritis Mother    Diabetes Mother    Hyperlipidemia Mother    Hypertension Mother    Breast cancer Neg Hx    Social History   Socioeconomic History   Marital status: Married    Spouse name: Not on file   Number of children: Not on file   Years of education: Not on file   Highest education level: Not on file  Occupational History   Not on file  Tobacco Use   Smoking status: Never   Smokeless tobacco: Never  Vaping Use   Vaping Use: Never used  Substance and Sexual Activity   Alcohol use: No   Drug use: No   Sexual activity: Yes  Other Topics Concern   Not on file  Social History Narrative   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   eletriptan (RELPAX) 40 MG tablet Take 40 mg by mouth as needed for migraine or headache. (can repeat in 1-2 hrs if no response), no more than 80 mg in one day and no more than 160 mg / week.   MAGNESIUM PO Take by mouth.   Multiple Vitamins-Minerals (ZINC PO) Take by mouth.   norgestimate-ethinyl estradiol (ORTHO-CYCLEN,SPRINTEC,PREVIFEM) 0.25-35 MG-MCG tablet Take by mouth.   ondansetron (ZOFRAN-ODT) 8 MG disintegrating tablet Take 1 tablet (8 mg total) by mouth every 8 (eight) hours as needed for nausea or vomiting.   Riboflavin (B2 PO) Take by mouth.   VITAMIN D PO Take by mouth.   No Known Allergies No results found for this or any previous visit (from the past 2160 hour(s)). Objective  Body mass index is 22.02 kg/m. Wt Readings from Last 3 Encounters:  07/18/21 136 lb 6.4 oz (61.9 kg)  07/25/20 131 lb 9.6 oz (59.7 kg)  07/03/20 131 lb (59.4 kg)   Temp Readings from Last 3 Encounters:  07/18/21 98.1 F (36.7 C) (Oral)  07/25/20 98.1 F (36.7 C) (Oral)  07/03/20 98.2 F (36.8 C) (Oral)   BP Readings from Last 3 Encounters:  07/18/21 100/70  07/25/20 117/84  07/03/20 106/70   Pulse Readings from Last 3 Encounters:  07/18/21 71  07/25/20 71  07/03/20 65    Physical Exam Vitals and nursing note reviewed.  Constitutional:      Appearance: Normal appearance. She is well-developed and well-groomed.  HENT:     Head: Normocephalic and atraumatic.  Eyes:     Conjunctiva/sclera: Conjunctivae normal.     Pupils: Pupils are equal, round, and reactive to light.  Cardiovascular:     Rate and Rhythm: Normal rate and regular rhythm.     Heart sounds: Normal heart sounds. No murmur heard. Pulmonary:     Effort: Pulmonary effort is normal.     Breath sounds: Normal breath sounds.  Abdominal:     General: Abdomen is flat.  Bowel sounds are normal.     Tenderness: There is no abdominal tenderness.  Musculoskeletal:        General: No tenderness.  Skin:    General: Skin is warm and dry.  Neurological:     General: No focal deficit present.     Mental Status: She is alert and oriented to person, place, and time. Mental status is at baseline.     Cranial Nerves: Cranial nerves 2-12 are intact.     Motor: Motor function is intact.     Coordination: Coordination is intact.     Gait: Gait is intact.  Psychiatric:        Attention and Perception: Attention and perception normal.        Mood and Affect: Mood and affect normal.        Speech: Speech normal.        Behavior: Behavior normal. Behavior is cooperative.        Thought Content: Thought content normal.        Cognition and Memory: Cognition and memory normal.        Judgment: Judgment normal.     Assessment  Plan  Annual physical exam - Plan: Comprehensive metabolic panel, Lipid panel, CBC with Differential/Platelet, TSH, Urinalysis, Routine w reflex microscopic, Vitamin D (25 hydroxy), Vitamin B12, IBC + Ferritin   Abdominal bloating F/u Gi upcoming  fatigue Iron deficiency - Plan: IBC + Ferritin B12 deficiency - Plan: Vitamin B12 Vitamin D deficiency - Plan: Vitamin D (25 hydroxy)   HM Flu shot utd 10/13/19 Tdap 11/14/10 pt to check the date  covid 3/3 booster    OB/GYN Dr. Leonides Schanz pap 03/15/17 neg pap neg HPV 08/2021   mammo 08/14/19 neg ordered [facility] if too expensive can do [facility]  Ordered [facility]   Colonoscopy due age 68 y.o FH dad dx'ed 24 will start routine screening q5 years age 46 y.o  f/u upcoming 2023   Skin no issues mole right face    D3 4000- 5000 IU qd and calcium 600 mg 2x per day also taking vitamin C, D biotin and E    Declines STD testing    rec f/u dermatology for 2020 on [facility] c/w mole on right face established saw 2020 rec [facility] no bx's    Provider: Dr. Olivia Mackie McLean-Scocuzza-Internal Medicine

## 2021-07-18 NOTE — Patient Instructions (Addendum)
Call back if not had Tdap vaccine with employee health   [place] $99 mammogram 4.8 73 [employer] reviews Mammography service in [place], [place] in: [facility] Address: [address], [place], [place] [address] Hours:  Open ? Closes 5?PM Phone: [phone]   Try Align probiotics     Call and make appt cardiology     Sinus Tachycardia  Sinus tachycardia is a kind of fast heartbeat. In sinus tachycardia, the heart beats more than 100 times a minute. Sinus tachycardia starts in a part of the heart called the sinus node. Sinus tachycardia may be harmless, or it may be a sign of a serious condition. What are the causes? This condition may be caused by: Exercise or exertion. A fever. Pain. Loss of body fluids (dehydration). Severe bleeding (hemorrhage). Anxiety and stress. Certain substances, including: Alcohol. Caffeine. Tobacco and nicotine products. Cold medicines. Illegal drugs. Medical conditions including: Heart disease. An infection. An overactive thyroid (hyperthyroidism). A lack of red blood cells (anemia). What are the signs or symptoms? Symptoms of this condition include: A feeling that the heart is beating quickly (palpitations). Suddenly noticing your heartbeat (cardiac awareness). Dizziness. Tiredness (fatigue). Shortness of breath. Chest pain. Nausea. Fainting. How is this diagnosed? This condition is diagnosed with: A physical exam. Other tests, such as: Blood tests. An electrocardiogram (ECG). This test measures the electrical activity of the heart. Ambulatory cardiac monitor. This records your heartbeats for 24 hours or more. You may be referred to a heart specialist (cardiologist). How is this treated? Treatment for this condition depends on the cause or the underlying condition. Treatment may involve: Treating the underlying condition. Taking new medicines or changing your  current medicines as told by your health care provider. Making changes to your diet or lifestyle. Follow these instructions at home: Lifestyle  Do not use any products that contain nicotine or tobacco, such as cigarettes and e-cigarettes. If you need help quitting, ask your health care provider. Do not use illegal drugs, such as cocaine. Learn relaxation methods to help you when you get stressed or anxious. These include deep breathing. Avoid caffeine or other stimulants. Alcohol use  Do not drink alcohol if: Your health care provider tells you not to drink. You are pregnant, may be pregnant, or are planning to become pregnant. If you drink alcohol, limit how much you have: 0-1 drink a day for women. 0-2 drinks a day for men. Be aware of how much alcohol is in your drink. In the [place] one drink equals one typical bottle of beer (12 oz), one-half glass of wine (5 oz), or one shot of hard liquor (1 oz). General instructions Drink enough fluids to keep your urine pale yellow. Take over-the-counter and prescription medicines only as told by your health care provider. Keep all follow-up visits as told by your health care provider. This is important. Contact a health care provider if you have: A fever. Vomiting or diarrhea that does not go away. Get help right away if you: Have pain in your chest, upper arms, jaw, or neck. Become weak or dizzy. Feel faint. Have palpitations that do not go away. Summary In sinus tachycardia, the heart beats more than 100 times a minute. Sinus tachycardia may be harmless, or it may be a sign of a serious condition. Treatment for this condition depends on the cause or the underlying condition. Get help right away if you have pain in your chest, upper arms, jaw, or  neck. This information is not intended to replace advice given to you by your health care provider. Make sure you discuss any questions you have with your health care provider. Document Revised:  05/02/2020 Document Reviewed: 05/02/2020 Elsevier Patient Education  [place].  Irritable Bowel Syndrome, Adult  Irritable bowel syndrome (IBS) is a group of symptoms that affects the organs responsible for digestion (gastrointestinal tract, or GI tract). IBS is not one specific disease. To regulate how the GI tract works, the body sends signals back and forth between the intestines and the brain. If you have IBS, there may be a problem with these signals. As a result, the GI tract does not function normally. The intestines may become more sensitive and overreact to certain things. This may be especially true when you eat certain foods or when you are under stress. There are four main types of IBS. These may be determined based on the consistency of your stool (feces): IBS with mostly (predominance of) diarrhea. IBS with predominance of constipation. IBS with mixed bowel habits. This includes both diarrhea and constipation. IBS unclassified. This includes IBS that cannot be categorized into one of the other three main types. It is important to know which type of IBS you have. Certain treatments are more likely to be helpful for certain types of IBS. What are the causes? The exact cause of IBS is not known. What increases the risk? You may have a higher risk for IBS if you: Are female. Are younger than 40 years. Have a family history of IBS. Have a mental health condition, such as depression, anxiety, or post-traumatic stress disorder. Have had a bacterial infection of your GI tract. What are the signs or symptoms? Symptoms of IBS vary from person to person. The main symptom is abdominal pain or discomfort. Other symptoms usually include one or more of the following: Diarrhea, constipation, or both. Swelling or bloating in the abdomen. Feeling full after eating a small or regular-sized meal. Frequent gas. Mucus in the stool. A feeling of having more stool left after a bowel  movement. Symptoms tend to come and go. They may be triggered by stress, mental health conditions, or certain foods. How is this diagnosed? This condition may be diagnosed based on a physical exam, your medical history, and your symptoms. You may have tests, such as: Blood tests. Stool test. Colonoscopy. This is a procedure in which your GI tract is viewed with a long, thin, flexible tube. How is this treated? There is no cure for IBS, but treatment can help relieve symptoms. Treatment depends on the type of IBS you have, and may include: Changes to your diet, such as: Avoiding foods that cause symptoms. Drinking more water. Following a low-FODMAP (fermentable oligosaccharides, disaccharides, monosaccharides, and polyols) diet for up to 6 weeks, or as told by your health care provider. FODMAPs are sugars that are hard for some people to digest. Eating more fiber. Eating small meals at the same times every day. Medicines. These may include: Fiber supplements, if you have constipation. Medicine to control diarrhea (antidiarrheal medicines). Medicine to help control muscle tightening (spasms) in your GI tract (antispasmodic medicines). Medicines to help with mental health conditions, such as antidepressants. Talk therapy or counseling. Working with a dietitian to help create a food plan that is right for you. Managing your stress. Follow these instructions at home: Eating and drinking  Eat a healthy diet. Eat 5-6 small meals a day. Try to eat meals at about the same  times each day. Do not eat large meals. Gradually eat more fiber-rich foods. These include whole grains, fruits, and vegetables. This may be especially helpful if you have IBS with constipation. Eat a diet low in FODMAPs. You may need to avoid foods such as citrus fruits, cabbage, garlic, and onions. Drink enough fluid to keep your urine pale yellow. Keep a journal of foods that seem to trigger symptoms. Avoid foods and  drinks that: Contain added sugar. Make your symptoms worse. These may include dairy products, caffeinated drinks, and carbonated drinks. Alcohol use Do not drink alcohol if: Your health care provider tells you not to drink. You are pregnant, may be pregnant, or are planning to become pregnant. If you drink alcohol: Limit how much you have to: 0-1 drink a day for women. 0-2 drinks a day for men. Know how much alcohol is in your drink. In the [place] one drink equals one 12 oz bottle of beer (355 mL), one 5 oz glass of wine (148 mL), or one 1 oz glass of hard liquor (44 mL) General instructions Take over-the-counter and prescription medicines only as told by your health care provider. This includes supplements. Get enough exercise. Do at least 150 minutes of moderate-intensity exercise each week. Manage your stress. Getting enough sleep and exercise can help you manage stress. Keep all follow-up visits. This is important. This includes all visits with your health care provider and therapist. Where to find more information International Foundation for Functional Gastrointestinal Disorders: aboutibs.[employer] of Diabetes and Digestive and Kidney Diseases: [employer].is Contact a health care provider if: You have constant pain. You lose weight. You have diarrhea that gets worse. You have bleeding from the rectum. You vomit often. You have a fever. Get help right away if: You have severe abdominal pain. You have diarrhea with symptoms of dehydration, such as dizziness or dry mouth. You have bloody or black stools. You have severe abdominal bloating. You have vomiting that does not stop. You have blood in your vomit. Summary Irritable bowel syndrome (IBS) is not one specific disease. It is a group of symptoms that affects digestion. Your intestines may become more sensitive and overreact to certain things. This may be especially true when you eat certain foods or when you are  under stress. There is no cure for IBS, but treatment can help relieve symptoms. This information is not intended to replace advice given to you by your health care provider. Make sure you discuss any questions you have with your health care provider. Document Revised: 12/04/2020 Document Reviewed: 12/04/2020 Elsevier Patient Education  [place] stands for fermentable oligosaccharides, disaccharides, monosaccharides, and polyols. These are sugars that are hard for some people to digest. A low-FODMAP eating plan may help some people who have irritable bowel syndrome (IBS) and certain other bowel (intestinal) diseases to manage their symptoms. This meal plan can be complicated to follow. Work with a diet and nutrition specialist (dietitian) to make a low-FODMAP eating plan that is right for you. A dietitian can help make sure that you get enough nutrition from this diet. What are tips for following this plan? Reading food labels Check labels for hidden FODMAPs such as: High-fructose syrup. Honey. Agave. Natural fruit flavors. Onion or garlic powder. Choose low-FODMAP foods that contain 3-4 grams of fiber per serving. Check food labels for serving sizes. Eat only one serving at a time to make sure FODMAP levels stay low. Shopping Shop with a  list of foods that are recommended on this diet and make a meal plan. Meal planning Follow a low-FODMAP eating plan for up to 6 weeks, or as told by your health care provider or dietitian. To follow the eating plan: Eliminate high-FODMAP foods from your diet completely. Choose only low-FODMAP foods to eat. You will do this for 2-6 weeks. Gradually reintroduce high-FODMAP foods into your diet one at a time. Most people should wait a few days before introducing the next new high-FODMAP food into their meal plan. Your dietitian can recommend how quickly you may reintroduce foods. Keep a daily record of what and how  much you eat and drink. Make note of any symptoms that you have after eating. Review your daily record with a dietitian regularly to identify which foods you can eat and which foods you should avoid. General tips Drink enough fluid each day to keep your urine pale yellow. Avoid processed foods. These often have added sugar and may be high in FODMAPs. Avoid most dairy products, whole grains, and sweeteners. Work with a dietitian to make sure you get enough fiber in your diet. Avoid high FODMAP foods at meals to manage symptoms. Recommended foods Fruits Bananas, oranges, tangerines, lemons, limes, blueberries, raspberries, strawberries, grapes, cantaloupe, honeydew melon, kiwi, papaya, passion fruit, and pineapple. Limited amounts of dried cranberries, banana chips, and shredded coconut. Vegetables Eggplant, zucchini, cucumber, peppers, green beans, bean sprouts, lettuce, arugula, kale, Swiss chard, spinach, collard greens, bok choy, summer squash, potato, and tomato. Limited amounts of corn, carrot, and sweet potato. Green parts of scallions. Grains Gluten-free grains, such as rice, oats, buckwheat, quinoa, corn, polenta, and millet. Gluten-free pasta, bread, or cereal. Rice noodles. Corn tortillas. Meats and other proteins Unseasoned beef, pork, poultry, or fish. Eggs. Berniece Salines. Tofu (firm) and tempeh. Limited amounts of nuts and seeds, such as almonds, walnuts, [place] nuts, pecans, peanuts, nut butters, pumpkin seeds, chia seeds, and sunflower seeds. Dairy Lactose-free milk, yogurt, and kefir. Lactose-free cottage cheese and ice cream. Non-dairy milks, such as almond, coconut, hemp, and rice milk. Non-dairy yogurt. Limited amounts of goat cheese, brie, mozzarella, parmesan, swiss, and other hard cheeses. Fats and oils Butter-free spreads. Vegetable oils, such as olive, canola, and sunflower oil. Seasoning and other foods Artificial sweeteners with names that do not end in "ol," such as aspartame,  saccharine, and stevia. Maple syrup, white table sugar, raw sugar, brown sugar, and molasses. Mayonnaise, soy sauce, and tamari. Fresh basil, coriander, parsley, rosemary, and thyme. Beverages Water and mineral water. Sugar-sweetened soft drinks. Small amounts of orange juice or cranberry juice. Black and green tea. Most dry wines. Coffee. The items listed above may not be a complete list of foods and beverages you can eat. Contact a dietitian for more information. Foods to avoid Fruits Fresh, dried, and juiced forms of apple, pear, watermelon, peach, plum, cherries, apricots, blackberries, boysenberries, figs, nectarines, and mango. Avocado. Vegetables Chicory root, artichoke, asparagus, cabbage, snow peas, Brussels sprouts, broccoli, sugar snap peas, mushrooms, celery, and cauliflower. Onions, garlic, leeks, and the white part of scallions. Grains Wheat, including kamut, durum, and semolina. Barley and bulgur. Couscous. Wheat-based cereals. Wheat noodles, bread, crackers, and pastries. Meats and other proteins Fried or fatty meat. Sausage. Cashews and pistachios. Soybeans, baked beans, black beans, chickpeas, kidney beans, fava beans, navy beans, lentils, black-eyed peas, and split peas. Dairy Milk, yogurt, ice cream, and soft cheese. Cream and sour cream. Milk-based sauces. Custard. Buttermilk. Soy milk. Seasoning and other foods Any sugar-free gum or candy.  Foods that contain artificial sweeteners such as sorbitol, mannitol, isomalt, or xylitol. Foods that contain honey, high-fructose corn syrup, or agave. Bouillon, vegetable stock, beef stock, and chicken stock. Garlic and onion powder. Condiments made with onion, such as hummus, chutney, pickles, relish, salad dressing, and salsa. Tomato paste. Beverages Chicory-based drinks. Coffee substitutes. Chamomile tea. Fennel tea. Sweet or fortified wines such as port or sherry. Diet soft drinks made with isomalt, mannitol, maltitol, sorbitol, or  xylitol. Apple, pear, and mango juice. Juices with high-fructose corn syrup. The items listed above may not be a complete list of foods and beverages you should avoid. Contact a dietitian for more information. Summary FODMAP stands for fermentable oligosaccharides, disaccharides, monosaccharides, and polyols. These are sugars that are hard for some people to digest. A low-FODMAP eating plan is a short-term diet that helps to ease symptoms of certain bowel diseases. The eating plan usually lasts up to 6 weeks. After that, high-FODMAP foods are reintroduced gradually and one at a time. This can help you find out which foods may be causing symptoms. A low-FODMAP eating plan can be complicated. It is best to work with a dietitian who has experience with this type of plan. This information is not intended to replace advice given to you by your health care provider. Make sure you discuss any questions you have with your health care provider. Document Revised: 05/11/2019 Document Reviewed: 05/11/2019 Elsevier Patient Education  [place] for Irritable Bowel Syndrome When you have irritable bowel syndrome (IBS), it is very important to follow the eating habits that are best for your condition. IBS may cause various symptoms, such as pain in the abdomen, constipation, or diarrhea. Choosing the right foods can help to ease the discomfort from these symptoms. Work with your health care provider and dietitian to find the eating plan that will help to control your symptoms. What are tips for following this plan?  Keep a food diary. This will help you identify foods that cause symptoms. Write down: What you eat and when you eat it. What symptoms you have. When symptoms occur in relation to your meals, such as "pain in abdomen 2 hours after dinner." Eat your meals slowly and in a relaxed setting. Aim to eat 5-6 small meals per day. Do not skip meals. Drink enough fluid to keep your urine pale  yellow. Ask your health care provider if you should take an over-the-counter probiotic to help restore healthy bacteria in your gut (digestive tract). Probiotics are foods that contain good bacteria and yeasts. Your dietitian may have specific dietary recommendations for you based on your symptoms. Your dietitian may recommend that you: Avoid foods that cause symptoms. Talk with your dietitian about other ways to get the same nutrients that are in those problem foods. Avoid foods with gluten. Gluten is a protein that is found in rye, wheat, and barley. Eat more foods that contain soluble fiber. Examples of foods with high soluble fiber include oats, seeds, and certain fruits and vegetables. Take a fiber supplement if told by your dietitian. Reduce or avoid certain foods called FODMAPs. These are foods that contain sugars that are hard for some people to digest. Ask your health care provider which foods to avoid. What foods should I avoid? The following are some foods and drinks that may make your symptoms worse: Fatty foods, such as french fries. Foods that contain gluten, such as pasta and cereal. Dairy products, such as milk, cheese, and ice cream. Spicy foods.  Alcohol. Products with caffeine, such as coffee, tea, or chocolate. Carbonated drinks, such as soda. Foods that are high in FODMAPs. These include certain fruits and vegetables. Products with sweeteners such as honey, high fructose corn syrup, sorbitol, and mannitol. The items listed above may not be a complete list of foods and beverages you should avoid. Contact a dietitian for more information. What foods are good sources of fiber? Your health care provider or dietitian may recommend that you eat more foods that contain fiber. Fiber can help to reduce constipation and other IBS symptoms. Add foods with fiber to your diet a little at a time so your body can get used to them. Too much fiber at one time might cause gas and swelling of  your abdomen. The following are some foods that are good sources of fiber: Berries, such as raspberries, strawberries, and blueberries. Tomatoes. Carrots. Brown rice. Oats. Seeds, such as chia and pumpkin seeds. The items listed above may not be a complete list of recommended sources of fiber. Contact your dietitian for more options. Where to find more information International Foundation for Functional Gastrointestinal Disorders: aboutibs.[employer] of Diabetes and Digestive and Kidney Diseases: [employer].is Summary When you have irritable bowel syndrome (IBS), it is very important to follow the eating habits that are best for your condition. IBS may cause various symptoms, such as pain in the abdomen, constipation, or diarrhea. Choosing the right foods can help to ease the discomfort that comes from symptoms. Your health care provider or dietitian may recommend that you eat more foods that contain fiber. Keep a food diary. This will help you identify foods that cause symptoms. This information is not intended to replace advice given to you by your health care provider. Make sure you discuss any questions you have with your health care provider. Document Revised: 12/03/2020 Document Reviewed: 12/03/2020 Elsevier Patient Education  [place].

## 2021-07-19 LAB — URINALYSIS, ROUTINE W REFLEX MICROSCOPIC
Bilirubin Urine: NEGATIVE
Glucose, UA: NEGATIVE
Hgb urine dipstick: NEGATIVE
Ketones, ur: NEGATIVE
Leukocytes,Ua: NEGATIVE
Nitrite: NEGATIVE
Protein, ur: NEGATIVE
Specific Gravity, Urine: 1.006 (ref 1.001–1.035)
pH: 7.5 (ref 5.0–8.0)

## 2021-07-22 ENCOUNTER — Telehealth: Payer: Self-pay

## 2021-07-22 NOTE — Telephone Encounter (Signed)
Lvm for pt to return call in regards to lab results.  Per Dr.Tracy: Urine negative  Liver kidneys normal  Cholesterol normal  Blood cts normal  Thyroid lab normal  Vitamin D normal and B12 normal  Iron normal

## 2021-08-27 ENCOUNTER — Ambulatory Visit: Payer: BC Managed Care – PPO | Admitting: Gastroenterology

## 2021-09-09 DIAGNOSIS — Z124 Encounter for screening for malignant neoplasm of cervix: Secondary | ICD-10-CM | POA: Diagnosis not present

## 2021-09-09 DIAGNOSIS — Z01419 Encounter for gynecological examination (general) (routine) without abnormal findings: Secondary | ICD-10-CM | POA: Diagnosis not present

## 2021-09-09 DIAGNOSIS — Z1151 Encounter for screening for human papillomavirus (HPV): Secondary | ICD-10-CM | POA: Diagnosis not present

## 2021-09-09 DIAGNOSIS — Z3041 Encounter for surveillance of contraceptive pills: Secondary | ICD-10-CM | POA: Diagnosis not present

## 2021-09-09 LAB — HM PAP SMEAR: HM Pap smear: NORMAL

## 2021-09-11 ENCOUNTER — Ambulatory Visit
Admission: RE | Admit: 2021-09-11 | Discharge: 2021-09-11 | Disposition: A | Discharge status: Home / Self Care | Payer: 59 | Source: Ambulatory Visit | Attending: Internal Medicine | Admitting: Internal Medicine

## 2021-09-11 DIAGNOSIS — Z1231 Encounter for screening mammogram for malignant neoplasm of breast: Secondary | ICD-10-CM | POA: Diagnosis not present

## 2021-09-12 ENCOUNTER — Other Ambulatory Visit: Payer: 59

## 2021-09-12 ENCOUNTER — Encounter: Payer: Self-pay | Admitting: Internal Medicine

## 2021-09-12 ENCOUNTER — Ambulatory Visit: Payer: 59 | Admitting: Internal Medicine

## 2021-09-12 VITALS — BP 106/70 | HR 91 | Ht 66.0 in | Wt 136.0 lb

## 2021-09-12 DIAGNOSIS — Z800 Family history of malignant neoplasm of digestive organs: Secondary | ICD-10-CM

## 2021-09-12 DIAGNOSIS — K5900 Constipation, unspecified: Secondary | ICD-10-CM

## 2021-09-12 DIAGNOSIS — R140 Abdominal distension (gaseous): Secondary | ICD-10-CM

## 2021-09-12 DIAGNOSIS — Z1211 Encounter for screening for malignant neoplasm of colon: Secondary | ICD-10-CM

## 2021-09-12 MED ORDER — PLENVU 140 G PO SOLR: 1.0000 | ORAL | 0 refills | Status: DC

## 2021-09-12 NOTE — Patient Instructions (Signed)
Your provider has requested that you go to the basement level for lab work before leaving today. Press "B" on the elevator. The lab is located at the first door on the left as you exit the elevator.  You have been scheduled for a colonoscopy. Please follow written instructions given to you at your visit today.  Please pick up your prep supplies at the pharmacy within the next 1-3 days. If you use inhalers (even only as needed), please bring them with you on the day of your procedure.  We have given you samples of the following medication to take: Linzess 145 mcg daily  _______________________________________________________  If you are age 73 or older, your body mass index should be between 23-30. Your Body mass index is 21.95 kg/m. If this is out of the aforementioned range listed, please consider follow up with your Primary Care Provider.  If you are age 97 or younger, your body mass index should be between 19-25. Your Body mass index is 21.95 kg/m. If this is out of the aformentioned range listed, please consider follow up with your Primary Care Provider.   ________________________________________________________  The [facility] providers would like to encourage you to use [facility] to communicate with providers for non-urgent requests or questions.  Due to long hold times on the telephone, sending your provider a message by [facility] may be a faster and more efficient way to get a response.  Please allow 48 business hours for a response.  Please remember that this is for non-urgent requests.  _______________________________________________________ Due to recent changes in healthcare laws, you may see the results of your imaging and laboratory studies on MyChart before your provider has had a chance to review them.  We understand that in some cases there may be results that are confusing or concerning to you. Not all laboratory results come back in the same time frame and the provider may be  waiting for multiple results in order to interpret others.  Please give [place] 48 hours in order for your provider to thoroughly review all the results before contacting the office for clarification of your results.

## 2021-09-12 NOTE — Progress Notes (Signed)
HISTORY OF PRESENT ILLNESS:  Monica Kaiser is a 46 y.o. 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  Patient reports lifelong problems with constipation.  She tells me that she underwent colonoscopy around age 40 while living in [place].  Apparently this was unremarkable and she was given the diagnosis of IBS without any particular treatment.  She tells me that the problem has progressed over time.  However, particular worse over the past year.  1 year ago she was prescribed Linzess but did not take this out of concern that she might have diarrhea that would impact her work.  She tells me that she has bowel movements about twice per week.  Often feels better after a bowel movement, but not always.  She denies weight gain or rectal bleeding.  She has tried a number of different over-the-counter agents including milk of magnesia, magnesium citrate, MiraLAX, Senokot, and laxative suppositories.  The latter seems to work best.  She shows me images from her cell phone of her abdomen when it is most bloated.  Tends to be less so in the morning and worsens towards the latter part of the day and particularly after meals.  She tells me that her father was diagnosed with colon cancer in his early 18s.  Review of blood work from July 18, 2021 shows normal CBC with hemoglobin 12.9 and normal comprehensive metabolic panel.  Normal iron studies, TSH, B12, and urinalysis.  CT scan of the abdomen and pelvis with contrast June 2020 to evaluate flank pain was unremarkable.  REVIEW OF SYSTEMS:  All non-GI ROS negative unless otherwise stated in the HPI except for headaches  Past Medical History:  Diagnosis Date   Anxiety    Fatigue    Low back pain    Migraine     Miscarriage     Past Surgical History:  Procedure Laterality Date   BREAST CYST EXCISION Right 2003   benign    COLONOSCOPY  2004    Social History Monica Kaiser  reports that she has never smoked. She has never used smokeless tobacco. She reports that she does not drink alcohol and does not use drugs.  family history includes Arthritis in her mother; Cancer in her father; Colon cancer (age of onset: 36) in her father; Diabetes in her mother; Hyperlipidemia in her mother; Hypertension in her mother; Lung cancer (age of onset: 56) in her father.  No Known Allergies     PHYSICAL EXAMINATION: Vital signs: BP 106/70   Pulse 91   Ht '5\' 6"'$  (1.676 m)   Wt 136 lb (61.7 kg)   LMP 08/27/2021   BMI 21.95 kg/m   Constitutional: generally well-appearing, no acute distress Psychiatric: alert and oriented x3, cooperative Eyes: extraocular movements intact, anicteric, conjunctiva pink Mouth: oral pharynx moist, no lesions Neck: supple no lymphadenopathy Cardiovascular: heart regular rate and rhythm, no murmur Lungs: clear to auscultation bilaterally Abdomen: soft, nontender, nondistended, no obvious ascites, no peritoneal signs, normal bowel sounds, no organomegaly Rectal: Deferred until colonoscopy Extremities: no lower extremity edema bilaterally Skin: no lesions on visible extremities Neuro: No focal deficits.  Cranial nerves intact  ASSESSMENT:  1.  Constipation predominant irritable bowel syndrome 2.  Family history of colon cancer in father around age 30   PLAN:  1.  Schedule colonoscopy for high risk (for neoplasia) screening.The nature of the procedure, as well as the risks, benefits, and alternatives were carefully and thoroughly reviewed with the patient. Ample time for discussion and questions allowed. The patient understood, was satisfied, and agreed to proceed.  2.  Linzess 145 mcg daily.  Samples given 3.  Additional recommendations after the above

## 2021-09-14 ENCOUNTER — Encounter: Payer: Self-pay | Admitting: Internal Medicine

## 2021-09-14 ENCOUNTER — Other Ambulatory Visit: Payer: Self-pay | Admitting: Internal Medicine

## 2021-09-14 DIAGNOSIS — N6310 Unspecified lump in the right breast, unspecified quadrant: Secondary | ICD-10-CM | POA: Insufficient documentation

## 2021-09-15 ENCOUNTER — Telehealth: Payer: Self-pay | Admitting: Internal Medicine

## 2021-09-15 NOTE — Telephone Encounter (Signed)
Patient would like Dr Audrie Gallus CMA to call her back. She spoke to CMA this morning.

## 2021-09-16 LAB — IGA: Immunoglobulin A: 186 mg/dL (ref 47–310)

## 2021-09-16 LAB — TISSUE TRANSGLUTAMINASE, IGA: (tTG) Ab, IgA: <1 U/mL

## 2021-09-17 ENCOUNTER — Ambulatory Visit
Admission: RE | Admit: 2021-09-17 | Discharge: 2021-09-17 | Disposition: A | Discharge status: Home / Self Care | Payer: 59 | Source: Ambulatory Visit | Attending: Internal Medicine | Admitting: Internal Medicine

## 2021-09-17 DIAGNOSIS — R922 Inconclusive mammogram: Secondary | ICD-10-CM | POA: Diagnosis not present

## 2021-09-17 DIAGNOSIS — N6310 Unspecified lump in the right breast, unspecified quadrant: Secondary | ICD-10-CM | POA: Diagnosis not present

## 2021-09-17 DIAGNOSIS — N6312 Unspecified lump in the right breast, upper inner quadrant: Secondary | ICD-10-CM | POA: Diagnosis not present

## 2021-09-18 DIAGNOSIS — D2239 Melanocytic nevi of other parts of face: Secondary | ICD-10-CM | POA: Diagnosis not present

## 2021-09-23 ENCOUNTER — Encounter: Payer: Self-pay | Admitting: Internal Medicine

## 2021-09-25 ENCOUNTER — Other Ambulatory Visit: Payer: 59

## 2021-09-29 ENCOUNTER — Telehealth: Payer: Self-pay | Admitting: Internal Medicine

## 2021-09-29 ENCOUNTER — Ambulatory Visit: Payer: BC Managed Care – PPO | Admitting: Internal Medicine

## 2021-09-29 NOTE — Telephone Encounter (Signed)
Inbound call from patient requesting a call back to discuss procedure tomorrow.

## 2021-09-29 NOTE — Telephone Encounter (Signed)
Returned call to patient. She wanted to know if she could start her prep a little later since she works 12 hour shifts,=. I told pt that I would not start prep past 7 pm this evening. Pt wanted to know if she could come with SNS on her nails and I told her that this was fine. Pt knows to arrive at 1:30 pm tomorrow with a care partner. Pt verbalized understanding and had no concerns at the end of the call.

## 2021-09-30 ENCOUNTER — Encounter: Payer: Self-pay | Admitting: Internal Medicine

## 2021-09-30 ENCOUNTER — Ambulatory Visit (AMBULATORY_SURGERY_CENTER): Payer: 59 | Admitting: Internal Medicine

## 2021-09-30 VITALS — BP 127/85 | HR 73 | Temp 96.8°F | Resp 12 | Ht 66.0 in | Wt 136.0 lb

## 2021-09-30 DIAGNOSIS — Z1211 Encounter for screening for malignant neoplasm of colon: Secondary | ICD-10-CM

## 2021-09-30 DIAGNOSIS — K5900 Constipation, unspecified: Secondary | ICD-10-CM | POA: Diagnosis not present

## 2021-09-30 DIAGNOSIS — Z800 Family history of malignant neoplasm of digestive organs: Secondary | ICD-10-CM | POA: Diagnosis not present

## 2021-09-30 DIAGNOSIS — R140 Abdominal distension (gaseous): Secondary | ICD-10-CM | POA: Diagnosis not present

## 2021-09-30 MED ORDER — SODIUM CHLORIDE 0.9 % IV SOLN: 500.0000 mL | Freq: Once | INTRAVENOUS | Status: DC

## 2021-09-30 NOTE — Op Note (Signed)
[facility] Patient Name: Monica Kaiser Procedure Date: 09/30/2021 2:56 PM MRN: [id number] Endoscopist: Docia Chuck. Henrene Pastor , MD Age: 46 Referring MD:  Date of Birth: 10-03-1975 Gender: Female Account #: [id number] Procedure:                Colonoscopy Indications:              Screening in patient at increased risk: Colorectal                            cancer in father around age 94. Incidental                            complaints of bloating and constipation. Given                            samples of Linzess 145 mcg at the time of recent                            office visit. Helping some Medicines:                Monitored Anesthesia Care Procedure:                Pre-Anesthesia Assessment:                           - Prior to the procedure, a History and Physical                            was performed, and patient medications and                            allergies were reviewed. The patient's tolerance of                            previous anesthesia was also reviewed. The risks                            and benefits of the procedure and the sedation                            options and risks were discussed with the patient.                            All questions were answered, and informed consent                            was obtained. Prior Anticoagulants: The patient has                            taken no previous anticoagulant or antiplatelet                            agents. ASA Grade Assessment: I - A normal, healthy  patient. After reviewing the risks and benefits,                            the patient was deemed in satisfactory condition to                            undergo the procedure.                           After obtaining informed consent, the colonoscope                            was passed under direct vision. Throughout the                            procedure, the patient's blood pressure, pulse, and                             oxygen saturations were monitored continuously. The                            Colonoscope was introduced through the anus and                            advanced to the the cecum, identified by                            appendiceal orifice and ileocecal valve. The                            ileocecal valve, appendiceal orifice, and rectum                            were photographed. The quality of the bowel                            preparation was excellent. The colonoscopy was                            performed without difficulty. The patient tolerated                            the procedure well. The bowel preparation used was                            Plenvu via split dose instruction. Scope In: 3:13:30 PM Scope Out: 3:25:59 PM Scope Withdrawal Time: 0 hours 8 minutes 31 seconds  Total Procedure Duration: 0 hours 12 minutes 29 seconds  Findings:                 The entire examined colon appeared normal on direct                            and retroflexion views. Complications:  No immediate complications. Estimated blood loss:                            None. Estimated Blood Loss:     Estimated blood loss: none. Impression:               - The entire examined colon is normal on direct and                            retroflexion views.                           - No specimens collected. Recommendation:           - Repeat colonoscopy in 5 years for screening                            purposes (family history).                           - Patient has a contact number available for                            emergencies. The signs and symptoms of potential                            delayed complications were discussed with the                            patient. Return to normal activities tomorrow.                            Written discharge instructions were provided to the                            patient.                           - Resume  previous diet.                           - Continue present medications.                           - Okay to try Linzess 290 mcg daily, to see if this                            is more effective than the 145 mcg dose please                            contact the office if you would like a prescription                            for Linzess (which ever dose works best) sent into  your pharmacy. Otherwise, return to the care of                            your primary provider Docia Chuck. Henrene Pastor, MD 09/30/2021 3:32:37 PM This report has been signed electronically.

## 2021-09-30 NOTE — Progress Notes (Signed)
PT taken to PACU. Monitors in place. VSS. Report given to RN. 

## 2021-09-30 NOTE — Patient Instructions (Signed)
Please read handouts provided. Continue present medications. Repeat colonoscopy in 5 years for screening. Resume previous diet. Contact office to see what strength Linzess you would like.   YOU HAD AN ENDOSCOPIC PROCEDURE TODAY AT [facility]:   Refer to the procedure report that was given to you for any specific questions about what was found during the examination.  If the procedure report does not answer your questions, please call your gastroenterologist to clarify.  If you requested that your care partner not be given the details of your procedure findings, then the procedure report has been included in a sealed envelope for you to review at your convenience later.  YOU SHOULD EXPECT: Some feelings of bloating in the abdomen. Passage of more gas than usual.  Walking can help get rid of the air that was put into your GI tract during the procedure and reduce the bloating. If you had a lower endoscopy (such as a colonoscopy or flexible sigmoidoscopy) you may notice spotting of blood in your stool or on the toilet paper. If you underwent a bowel prep for your procedure, you may not have a normal bowel movement for a few days.  Please Note:  You might notice some irritation and congestion in your nose or some drainage.  This is from the oxygen used during your procedure.  There is no need for concern and it should clear up in a day or so.  SYMPTOMS TO REPORT IMMEDIATELY:  Following lower endoscopy (colonoscopy or flexible sigmoidoscopy):  Excessive amounts of blood in the stool  Significant tenderness or worsening of abdominal pains  Swelling of the abdomen that is new, acute  Fever of 100F or higher.  For urgent or emergent issues, a gastroenterologist can be reached at any hour by calling [phone]. Do not use MyChart messaging for urgent concerns.    DIET:  We do recommend a small meal at first, but then you may proceed to your regular diet.  Drink plenty of fluids  but you should avoid alcoholic beverages for 24 hours.  ACTIVITY:  You should plan to take it easy for the rest of today and you should NOT DRIVE or use heavy machinery until tomorrow (because of the sedation medicines used during the test).    FOLLOW UP: Our staff will call the number listed on your records the next business day following your procedure.  We will call around 7:15- 8:00 am to check on you and address any questions or concerns that you may have regarding the information given to you following your procedure. If we do not reach you, we will leave a message.     If any biopsies were taken you will be contacted by phone or by letter within the next 1-3 weeks.  Please call us at [phone] if you have not heard about the biopsies in 3 weeks.    SIGNATURES/CONFIDENTIALITY: You and/or your care partner have signed paperwork which will be entered into your electronic medical record.  These signatures attest to the fact that that the information above on your After Visit Summary has been reviewed and is understood.  Full responsibility of the confidentiality of this discharge information lies with you and/or your care-partner.

## 2021-09-30 NOTE — Progress Notes (Signed)
VS completed by SM.  Pt's states no medical or surgical changes since previsit or office visit.

## 2021-10-01 ENCOUNTER — Telehealth: Payer: Self-pay | Admitting: *Deleted

## 2021-10-01 NOTE — Telephone Encounter (Signed)
  Follow up Call-     09/30/2021    1:38 PM  Call back number  Post procedure Call Back phone  # [phone]  Permission to leave phone message Yes     Patient questions:  Do you have a fever, pain , or abdominal swelling? No. Pain Score  0 *  Have you tolerated food without any problems? Yes.    Have you been able to return to your normal activities? Yes.    Do you have any questions about your discharge instructions: Diet   No. Medications  No. Follow up visit  No.  Do you have questions or concerns about your Care? No.  Actions: * If pain score is 4 or above: No action needed, pain <4.

## 2021-10-06 ENCOUNTER — Encounter: Payer: Self-pay | Admitting: Family Medicine

## 2021-10-09 ENCOUNTER — Encounter: Payer: Self-pay | Admitting: Internal Medicine

## 2021-10-09 NOTE — Telephone Encounter (Signed)
1.  There is no preferential diet for irritable bowel syndrome 2.  Increase your Linzess to 290 mcg daily and see if that helps 3.  Check back with [place] next week with an update

## 2021-10-15 ENCOUNTER — Ambulatory Visit: Payer: BC Managed Care – PPO | Admitting: Internal Medicine

## 2021-11-17 ENCOUNTER — Ambulatory Visit: Payer: 59

## 2021-11-17 ENCOUNTER — Ambulatory Visit: Payer: 59 | Attending: Internal Medicine

## 2021-11-17 DIAGNOSIS — M5459 Other low back pain: Secondary | ICD-10-CM | POA: Insufficient documentation

## 2021-11-17 DIAGNOSIS — M25561 Pain in right knee: Secondary | ICD-10-CM | POA: Insufficient documentation

## 2021-11-17 NOTE — Therapy (Signed)
PT/OT/SLP Screening Form   Time: in 13: 45   Time out 14: 16 Complaint R knee pain, back pain Past Medical Hx: hx of back pain Injury Date:~ a few days ago Pain Scale:7/10 knee pain Patient's phone number:   Hx (this occurrence):   A few days ago the pain in R knee began. Woke up with sharp pain in R medial leg. Pressure helped with wrapping leg. Pain with all movements. Additionally having low back pain for multiple days that worsens throughout the day.    Assessment: Ligament testing: negative Patella mobilizations: hypomobile; catching  Meniscal testing: +thessaly, , + joint line tenderness  Meniscal mobilization to R knee x3 minutes  Grade II mobilizations lumbar spine: painful; prone press up painful  Trigger Point Dry Needling (TDN), unbilled Education performed with patient regarding potential benefit of TDN. Reviewed precautions and risks with patient. Reviewed special precautions/risks over lung fields which include pneumothorax. Reviewed signs and symptoms of pneumothorax and advised pt to go to ER immediately if these symptoms develop advise them of dry needling treatment. Extensive time spent with pt to ensure full understanding of TDN risks. Pt provided verbal consent to treatment. TDN performed to  with 0.25 x 40 single needle placements with local twitch response (LTR). Pistoning technique utilized. Improved pain-free motion following intervention. Lumbar paraspinals x 2 minutes   Access Code: [email or web address] URL: [email or web address] Date: 11/17/2021 Prepared by: Janna Arch  Exercises - Leg Extension  - 1 x daily - 7 x weekly - 2 sets - 10 reps - 5 hold - Seated Posterior Pelvic Tilt  - 1 x daily - 7 x weekly - 2 sets - 10 reps - 5 hold - Supine Posterior Pelvic Tilt  - 1 x daily - 7 x weekly - 2 sets - 10 reps - 5 hold  Recommendations:    Comments:Patient presents with signs and symptoms indicative of possible meniscal involvement of medial side.  She additionally has symptoms of neural impingement in lumbar spine with involvement of limited muscle tissue length, guarding, and weakened core. Core stabilization technique given, K tape performed on knee, and brace recommendation. Patient encouraged to follow up if pain persists. Will say no intervention at this time however if pain continues will need full PT follow up.     '[]'$  Patient would benefit from an MD referral '[]'$  Patient would benefit from a full PT/OT/ SLP evaluation and treatment. '[x]'$  No intervention recommended at this time.

## 2022-01-06 DIAGNOSIS — L292 Pruritus vulvae: Secondary | ICD-10-CM | POA: Diagnosis not present

## 2022-02-19 DIAGNOSIS — H5203 Hypermetropia, bilateral: Secondary | ICD-10-CM | POA: Diagnosis not present

## 2022-03-30 ENCOUNTER — Other Ambulatory Visit: Payer: Self-pay

## 2022-03-30 MED ORDER — NORGESTIMATE-ETH ESTRADIOL 0.25-35 MG-MCG PO TABS
1.0000 | ORAL_TABLET | Freq: Every day | ORAL | 11 refills | Status: DC
  Filled 2022-04-24 (×3): qty 84, 84d supply, fill #0
  Filled 2022-07-19 – 2022-07-20 (×2): qty 28, 28d supply, fill #1

## 2022-04-20 ENCOUNTER — Ambulatory Visit: Payer: Commercial Managed Care - PPO | Admitting: Internal Medicine

## 2022-04-20 ENCOUNTER — Other Ambulatory Visit: Payer: Self-pay

## 2022-04-20 VITALS — BP 118/70 | HR 80 | Temp 97.9°F | Resp 16 | Ht 66.0 in | Wt 137.0 lb

## 2022-04-20 DIAGNOSIS — E611 Iron deficiency: Secondary | ICD-10-CM

## 2022-04-20 DIAGNOSIS — R5383 Other fatigue: Secondary | ICD-10-CM | POA: Diagnosis not present

## 2022-04-20 DIAGNOSIS — M545 Low back pain, unspecified: Secondary | ICD-10-CM | POA: Diagnosis not present

## 2022-04-20 DIAGNOSIS — Z13220 Encounter for screening for lipoid disorders: Secondary | ICD-10-CM | POA: Diagnosis not present

## 2022-04-20 DIAGNOSIS — E559 Vitamin D deficiency, unspecified: Secondary | ICD-10-CM | POA: Diagnosis not present

## 2022-04-20 DIAGNOSIS — E538 Deficiency of other specified B group vitamins: Secondary | ICD-10-CM

## 2022-04-20 MED ORDER — MELOXICAM 15 MG PO TABS
15.0000 mg | ORAL_TABLET | Freq: Every day | ORAL | 0 refills | Status: DC
  Filled 2022-04-20: qty 20, 20d supply, fill #0

## 2022-04-20 MED ORDER — TIZANIDINE HCL 4 MG PO TABS
4.0000 mg | ORAL_TABLET | Freq: Every evening | ORAL | 0 refills | Status: DC | PRN
  Filled 2022-04-20: qty 20, 20d supply, fill #0

## 2022-04-20 NOTE — Progress Notes (Signed)
Subjective:    Patient ID: Monica Kaiser, female    DOB: [birth year], 47 y.o.   MRN: [id number]  Patient here for  Chief Complaint  Patient presents with   Back Pain    HPI Work in appt for back pain.  Former pt of Dr [employer].  Has had back pain for a while. Intermittent flares over the last year, but increased over the past 1-2 weeks.  Low back.  No radicular symptoms.  Taking ibuprofen (200mg ) 3 tablets 1-2x/day.  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  Two weeks ago, changed jobs and is sitting more.  Only change that has occurred.  No fall.  No bladder or bowel change. Does report some fatigue.     Past Medical History:  Diagnosis Date   Anxiety    Fatigue    Low back pain    Migraine    Miscarriage    Past Surgical History:  Procedure Laterality Date   BREAST CYST EXCISION Right 2003   benign    COLONOSCOPY  2004   Family History  Problem Relation Age of Onset   Lung cancer Father 64   Colon cancer Father 68   Cancer Father        lung and colon cancer age 43   Arthritis Mother    Diabetes Mother    Hyperlipidemia Mother    Hypertension Mother    Breast cancer Neg Hx    Social History   Socioeconomic History   Marital status: Married    Spouse name: Not on file   Number of children: 2   Years of education: Not on file   Highest education level: Not on file  Occupational History   Not on file  Tobacco Use   Smoking status: Never   Smokeless tobacco: Never  Vaping Use   Vaping Use: Never used  Substance and Sexual Activity   Alcohol use: No   Drug use: No   Sexual activity: Yes  Other Topics Concern   Not on file  Social History Narrative   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  HENT:  Negative for congestion and sinus pressure.   Respiratory:  Negative for cough, chest tightness and shortness of breath.   Cardiovascular:  Negative for chest pain and palpitations.  Gastrointestinal:  Negative for abdominal pain, diarrhea, nausea and vomiting.  Genitourinary:  Negative for difficulty urinating and dysuria.  Musculoskeletal:  Positive for back pain. Negative for myalgias.  Skin:  Negative for color change and rash.  Neurological:  Negative for dizziness and headaches.  Psychiatric/Behavioral:  Negative for agitation and dysphoric mood.        Objective:     BP 118/70   Pulse 80   Temp 97.9 F (36.6 C)   Resp 16   Ht 5\' 6"  (1.676 m)   Wt 137 lb (62.1 kg)   SpO2 98%   BMI 22.11 kg/m  Wt Readings from Last 3 Encounters:  04/20/22 137 lb (62.1 kg)  09/30/21 136 lb (61.7 kg)  09/12/21 136 lb (61.7 kg)    Physical Exam Vitals reviewed.  Constitutional:      General: She is not in acute distress.    Appearance: Normal appearance.  HENT:     Head: Normocephalic and atraumatic.     Right Ear: External ear normal.     Left Ear: External ear normal.  Eyes:     General: No scleral icterus.       Right eye: No discharge.        Left eye: No discharge.     Conjunctiva/sclera: Conjunctivae normal.  Neck:     Thyroid: No thyromegaly.  Cardiovascular:     Rate and Rhythm: Normal rate and regular rhythm.  Pulmonary:     Effort: No respiratory distress.     Breath sounds: Normal breath sounds. No wheezing.  Abdominal:     General: Bowel sounds are normal.     Palpations: Abdomen is soft.     Tenderness: There is no abdominal tenderness.  Musculoskeletal:        General: No swelling or tenderness.     Cervical back: Neck supple. No tenderness.     Comments: Negative SLR.     Lymphadenopathy:     Cervical: No cervical adenopathy.  Skin:    Findings: No erythema or rash.  Neurological:     Mental Status: She is alert.  Psychiatric:        Mood and Affect: Mood normal.        Behavior: Behavior normal.      Outpatient Encounter Medications as of 04/20/2022  Medication Sig   meloxicam (MOBIC) 15 MG tablet Take 1 tablet (15 mg total) by mouth daily.   tiZANidine (ZANAFLEX) 4 MG tablet Take 1 tablet (4 mg total) by mouth at bedtime as needed for muscle spasms.   Ascorbic Acid (VITAMIN C PO) Take by mouth.   eletriptan (RELPAX) 40 MG tablet Take 40 mg by mouth as needed for migraine or headache. (can repeat in 1-2 hrs if no response), no more than 80 mg in one day and no more than 160 mg / week.   MAGNESIUM PO Take by mouth.   Multiple Vitamins-Minerals (ZINC PO) Take by mouth.   norgestimate-ethinyl estradiol (ORTHO-CYCLEN) 0.25-35 MG-MCG tablet Take 1 tablet by mouth daily.   norgestimate-ethinyl estradiol (ORTHO-CYCLEN,SPRINTEC,PREVIFEM) 0.25-35 MG-MCG tablet Take by mouth.   ondansetron (ZOFRAN-ODT) 8 MG disintegrating tablet Take 1 tablet (8 mg total) by mouth every 8 (eight) hours as needed for nausea or vomiting.   Riboflavin (B2 PO) Take by mouth.   VITAMIN D PO Take by mouth.   No facility-administered encounter medications on file as of 04/20/2022.     Lab Results  Component Value Date   WBC 4.7 04/23/2022   HGB 12.3 04/23/2022   HCT 36.7 04/23/2022   PLT 303 04/23/2022   GLUCOSE 94 04/23/2022   CHOL 170 04/23/2022   TRIG 96 04/23/2022   HDL 52 04/23/2022   LDLCALC 99 04/23/2022   ALT 15 04/23/2022   AST 18 04/23/2022   NA 138 04/23/2022   K 4.0 04/23/2022   CL 106 04/23/2022   CREATININE 0.50 04/23/2022   BUN 14 04/23/2022   CO2 25 04/23/2022   TSH 0.989 04/23/2022   HGBA1C 5.5 10/05/2018    MM DIAG BREAST TOMO UNI RIGHT  Result Date: 09/17/2021 CLINICAL DATA:  Breast mass  callback.  History of cysts EXAM: DIGITAL DIAGNOSTIC  UNILATERAL RIGHT MAMMOGRAM WITH TOMOSYNTHESIS; ULTRASOUND RIGHT BREAST LIMITED TECHNIQUE: Right digital diagnostic mammography and breast tomosynthesis was performed.; Targeted ultrasound examination of the right breast was performed COMPARISON:  Previous exam(s). ACR Breast Density Category c: The breast tissue is heterogeneously dense, which may obscure small masses. FINDINGS: Spot compression tomosynthesis views confirm persistence of an oval circumscribed mass in the RIGHT lower breast at middle depth. There are layering calcifications noted on the MLO imaging. Targeted ultrasound was performed of the RIGHT lower breast. At 6 o'clock 2 cm from the nipple, there is an oval circumscribed anechoic mass with posterior acoustic enhancement. It measures 10 x 6 x 9 mm and is consistent with a benign simple cyst. This corresponds to the site of screening mammographic concern. IMPRESSION: There is a benign cyst at the site of screening mammographic concern. RECOMMENDATION: Screening mammogram in one year.(Code:SM-B-01Y) I have discussed the findings and recommendations with the patient. If applicable, a reminder letter will be sent to the patient regarding the next appointment. BI-RADS CATEGORY  2: Benign. Electronically Signed   By: Meda Klinefelter M.D.   On: 09/17/2021 12:43   US BREAST LTD UNI RIGHT INC AXILLA  Result Date: 09/17/2021 CLINICAL DATA:  Breast mass callback.  History of cysts EXAM: DIGITAL DIAGNOSTIC UNILATERAL RIGHT MAMMOGRAM WITH TOMOSYNTHESIS; ULTRASOUND RIGHT BREAST LIMITED TECHNIQUE: Right digital diagnostic mammography and breast tomosynthesis was performed.; Targeted ultrasound examination of the right breast was performed COMPARISON:  Previous exam(s). ACR Breast Density Category c: The breast tissue is heterogeneously dense, which may obscure small masses. FINDINGS: Spot compression tomosynthesis views confirm persistence of an oval circumscribed mass in the RIGHT lower breast at middle  depth. There are layering calcifications noted on the MLO imaging. Targeted ultrasound was performed of the RIGHT lower breast. At 6 o'clock 2 cm from the nipple, there is an oval circumscribed anechoic mass with posterior acoustic enhancement. It measures 10 x 6 x 9 mm and is consistent with a benign simple cyst. This corresponds to the site of screening mammographic concern. IMPRESSION: There is a benign cyst at the site of screening mammographic concern. RECOMMENDATION: Screening mammogram in one year.(Code:SM-B-01Y) I have discussed the findings and recommendations with the patient. If applicable, a reminder letter will be sent to the patient regarding the next appointment. BI-RADS CATEGORY  2: Benign. Electronically Signed   By: Meda Klinefelter M.D.   On: 09/17/2021 12:43      Assessment & Plan:  Vitamin D deficiency -     VITAMIN D 25 Hydroxy (Vit-D Deficiency, Fractures); Future  Fatigue, unspecified type Assessment & Plan: Check routine labs - including cbc, metabolic panel and tsh.    Orders: -     TSH; Future -     Basic metabolic panel; Future -     CBC with Differential/Platelet; Future  Iron deficiency -     CBC with Differential/Platelet; Future  B12 deficiency -     Vitamin B12; Future  Screening cholesterol level -     Hepatic function panel; Future -     Lipid panel; Future  Midline low back pain without sciatica, unspecified chronicity Assessment & Plan: Persistent low back pain.  Worsened recently as outlined.  Only change - job change.  No radicular symptoms.  Trial of meloxicam.  Stop otc ibuprofen.  Zanaflex as directed.  Follow.  Call with update.  Consider PT.     Other orders -  Meloxicam; Take 1 tablet (15 mg total) by mouth daily.  Dispense: 20 tablet; Refill: 0 -     tiZANidine HCl; Take 1 tablet (4 mg total) by mouth at bedtime as needed for muscle spasms.  Dispense: 20 tablet; Refill: 0     Dale Northwood, MD

## 2022-04-22 ENCOUNTER — Telehealth: Payer: Self-pay | Admitting: Internal Medicine

## 2022-04-22 NOTE — Telephone Encounter (Signed)
Pt called in asking if the lab orders that are in, can be change to the lab @ [facility]? Any questions or concern, she's available [phone]

## 2022-04-23 ENCOUNTER — Other Ambulatory Visit
Admission: RE | Admit: 2022-04-23 | Discharge: 2022-04-23 | Disposition: A | Discharge status: Home / Self Care | Payer: Commercial Managed Care - PPO | Source: Ambulatory Visit | Attending: Internal Medicine | Admitting: Internal Medicine

## 2022-04-23 DIAGNOSIS — R5383 Other fatigue: Secondary | ICD-10-CM | POA: Diagnosis not present

## 2022-04-23 DIAGNOSIS — E559 Vitamin D deficiency, unspecified: Secondary | ICD-10-CM | POA: Diagnosis not present

## 2022-04-23 DIAGNOSIS — Z13220 Encounter for screening for lipoid disorders: Secondary | ICD-10-CM | POA: Insufficient documentation

## 2022-04-23 DIAGNOSIS — E538 Deficiency of other specified B group vitamins: Secondary | ICD-10-CM | POA: Diagnosis not present

## 2022-04-23 DIAGNOSIS — E611 Iron deficiency: Secondary | ICD-10-CM | POA: Insufficient documentation

## 2022-04-23 LAB — CBC WITH DIFFERENTIAL/PLATELET
Abs Immature Granulocytes: 0.01 10*3/uL (ref 0.00–0.07)
Basophils Absolute: 0.1 10*3/uL (ref 0.0–0.1)
Basophils Relative: 1 %
Eosinophils Absolute: 0.1 10*3/uL (ref 0.0–0.5)
Eosinophils Relative: 3 %
HCT: 36.7 % (ref 36.0–46.0)
Hemoglobin: 12.3 g/dL (ref 12.0–15.0)
Immature Granulocytes: 0 %
Lymphocytes Relative: 33 %
Lymphs Abs: 1.5 10*3/uL (ref 0.7–4.0)
MCH: 30.8 pg (ref 26.0–34.0)
MCHC: 33.5 g/dL (ref 30.0–36.0)
MCV: 92 fL (ref 80.0–100.0)
Monocytes Absolute: 0.4 10*3/uL (ref 0.1–1.0)
Monocytes Relative: 9 %
Neutro Abs: 2.6 10*3/uL (ref 1.7–7.7)
Neutrophils Relative %: 54 %
Platelets: 303 10*3/uL (ref 150–400)
RBC: 3.99 MIL/uL (ref 3.87–5.11)
RDW: 11.9 % (ref 11.5–15.5)
WBC: 4.7 10*3/uL (ref 4.0–10.5)
nRBC: 0 % (ref 0.0–0.2)

## 2022-04-23 LAB — LIPID PANEL
Cholesterol: 170 mg/dL (ref 0–200)
HDL: 52 mg/dL (ref >40)
LDL Cholesterol: 99 mg/dL (ref 0–99)
Total CHOL/HDL Ratio: 3.3 RATIO
Triglycerides: 96 mg/dL (ref <150)
VLDL: 19 mg/dL (ref 0–40)

## 2022-04-23 LAB — BASIC METABOLIC PANEL
Anion gap: 7 (ref 5–15)
BUN: 14 mg/dL (ref 6–20)
CO2: 25 mmol/L (ref 22–32)
Calcium: 8.6 mg/dL — ABNORMAL LOW (ref 8.9–10.3)
Chloride: 106 mmol/L (ref 98–111)
Creatinine, Ser: 0.5 mg/dL (ref 0.44–1.00)
GFR, Estimated: >60 mL/min (ref >60)
Glucose, Bld: 94 mg/dL (ref 70–99)
Potassium: 4 mmol/L (ref 3.5–5.1)
Sodium: 138 mmol/L (ref 135–145)

## 2022-04-23 LAB — HEPATIC FUNCTION PANEL
ALT: 15 U/L (ref 0–44)
AST: 18 U/L (ref 15–41)
Albumin: 3.6 g/dL (ref 3.5–5.0)
Alkaline Phosphatase: 35 U/L — ABNORMAL LOW (ref 38–126)
Bilirubin, Direct: <0.1 mg/dL (ref 0.0–0.2)
Total Bilirubin: 0.7 mg/dL (ref 0.3–1.2)
Total Protein: 7.1 g/dL (ref 6.5–8.1)

## 2022-04-23 LAB — VITAMIN B12: Vitamin B-12: 303 pg/mL (ref 180–914)

## 2022-04-23 LAB — VITAMIN D 25 HYDROXY (VIT D DEFICIENCY, FRACTURES): Vit D, 25-Hydroxy: 24.68 ng/mL — ABNORMAL LOW (ref 30–100)

## 2022-04-23 LAB — TSH: TSH: 0.989 u[IU]/mL (ref 0.350–4.500)

## 2022-04-23 NOTE — Telephone Encounter (Signed)
Pt had labs drawn this morning at the hospital.

## 2022-04-24 ENCOUNTER — Telehealth: Payer: Self-pay | Admitting: Internal Medicine

## 2022-04-24 ENCOUNTER — Other Ambulatory Visit: Payer: Commercial Managed Care - PPO

## 2022-04-24 ENCOUNTER — Other Ambulatory Visit: Payer: Self-pay

## 2022-04-24 ENCOUNTER — Encounter: Payer: Self-pay | Admitting: Internal Medicine

## 2022-04-24 NOTE — Telephone Encounter (Signed)
Ok for her to establish care with me.

## 2022-04-24 NOTE — Telephone Encounter (Signed)
Pt aware of lab results & mentioned that she is not taking any Vit D at all. She prefers a response via mychart.  She has questions about her Calcium & alka. Phos. as well.

## 2022-04-24 NOTE — Telephone Encounter (Signed)
Pt called in staying that she saw Dr. Lorin Picket on Monday 4/15, and as per provider request she will see her as a new pt.?? I wondering if this correct?

## 2022-04-26 ENCOUNTER — Encounter: Payer: Self-pay | Admitting: Internal Medicine

## 2022-04-26 NOTE — Assessment & Plan Note (Signed)
Persistent low back pain.  Worsened recently as outlined.  Only change - job change.  No radicular symptoms.  Trial of meloxicam.  Stop otc ibuprofen.  Zanaflex as directed.  Follow.  Call with update.  Consider PT.

## 2022-04-26 NOTE — Assessment & Plan Note (Signed)
Check routine labs - including cbc, metabolic panel and tsh.

## 2022-05-04 NOTE — Telephone Encounter (Signed)
Patient aware. Calcium food list mailed.

## 2022-05-04 NOTE — Telephone Encounter (Signed)
Please call and let her know that you will be sending information on foods that are good sources of calcium.  We can start with dietary calcium and follow.

## 2022-06-15 ENCOUNTER — Other Ambulatory Visit: Payer: Self-pay

## 2022-06-15 DIAGNOSIS — M7522 Bicipital tendinitis, left shoulder: Secondary | ICD-10-CM | POA: Diagnosis not present

## 2022-06-15 DIAGNOSIS — M7542 Impingement syndrome of left shoulder: Secondary | ICD-10-CM | POA: Diagnosis not present

## 2022-06-15 MED ORDER — CYCLOBENZAPRINE HCL 5 MG PO TABS
5.0000 mg | ORAL_TABLET | Freq: Three times a day (TID) | ORAL | 1 refills | Status: DC
  Filled 2022-06-15: qty 30, 10d supply, fill #0

## 2022-06-15 MED ORDER — METHYLPREDNISOLONE 4 MG PO TBPK
ORAL_TABLET | ORAL | 0 refills | Status: AC
  Filled 2022-06-15: qty 21, 6d supply, fill #0

## 2022-07-03 ENCOUNTER — Ambulatory Visit: Payer: Commercial Managed Care - PPO | Admitting: Internal Medicine

## 2022-07-03 ENCOUNTER — Other Ambulatory Visit: Payer: Self-pay

## 2022-07-03 ENCOUNTER — Encounter: Payer: Self-pay | Admitting: Internal Medicine

## 2022-07-03 VITALS — BP 108/70 | HR 76 | Temp 97.9°F | Resp 16 | Ht 66.0 in | Wt 136.0 lb

## 2022-07-03 DIAGNOSIS — G8929 Other chronic pain: Secondary | ICD-10-CM

## 2022-07-03 DIAGNOSIS — M545 Low back pain, unspecified: Secondary | ICD-10-CM | POA: Diagnosis not present

## 2022-07-03 DIAGNOSIS — G43919 Migraine, unspecified, intractable, without status migrainosus: Secondary | ICD-10-CM | POA: Diagnosis not present

## 2022-07-03 DIAGNOSIS — K5909 Other constipation: Secondary | ICD-10-CM | POA: Diagnosis not present

## 2022-07-03 DIAGNOSIS — M25512 Pain in left shoulder: Secondary | ICD-10-CM | POA: Diagnosis not present

## 2022-07-03 MED ORDER — MAGNESIUM OXIDE 400 MG PO TABS
400.0000 mg | ORAL_TABLET | Freq: Every day | ORAL | 2 refills | Status: DC
  Filled 2022-07-03: qty 30, 30d supply, fill #0

## 2022-07-03 NOTE — Progress Notes (Unsigned)
Subjective:    Patient ID: Monica Kaiser, female    DOB: [birth year], 47 y.o.   MRN: [id number]  Patient here for No chief complaint on file.   HPI    Past Medical History:  Diagnosis Date   Anxiety    Fatigue    Low back pain    Migraine    Miscarriage    Past Surgical History:  Procedure Laterality Date   BREAST CYST EXCISION Right 2003   benign    COLONOSCOPY  2004   Family History  Problem Relation Age of Onset   Lung cancer Father 64   Colon cancer Father 79   Cancer Father        lung and colon cancer age 56   Arthritis Mother    Diabetes Mother    Hyperlipidemia Mother    Hypertension Mother    Breast cancer Neg Hx    Social History   Socioeconomic History   Marital status: Married    Spouse name: Not on file   Number of children: 2   Years of education: Not on file   Highest education level: Not on file  Occupational History   Not on file  Tobacco Use   Smoking status: Never   Smokeless tobacco: Never  Vaping Use   Vaping Use: Never used  Substance and Sexual Activity   Alcohol use: No   Drug use: No   Sexual activity: Yes  Other Topics Concern   Not on file  Social History Narrative   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 Wt Readings from Last 3 Encounters:  04/20/22 137 lb (62.1 kg)  09/30/21 136 lb (61.7 kg)  09/12/21 136 lb (61.7 kg)    Physical Exam   Outpatient Encounter Medications as of 07/03/2022  Medication Sig   Ascorbic Acid (VITAMIN C PO) Take by mouth.   cyclobenzaprine (FLEXERIL) 5 MG tablet Take 1 tablet (5 mg total)  by mouth every 8 (eight) hours.   eletriptan (RELPAX) 40 MG tablet Take 40 mg by mouth as needed for migraine or headache. (can repeat in 1-2 hrs if no response), no more than 80 mg in one day and no more than 160 mg / week.   MAGNESIUM PO Take by mouth.   meloxicam (MOBIC) 15 MG tablet Take 1 tablet (15 mg total) by mouth daily.   Multiple Vitamins-Minerals (ZINC PO) Take by mouth.   norgestimate-ethinyl estradiol (ORTHO-CYCLEN) 0.25-35 MG-MCG tablet Take 1 tablet by mouth daily.   norgestimate-ethinyl estradiol (ORTHO-CYCLEN,SPRINTEC,PREVIFEM) 0.25-35 MG-MCG tablet Take by mouth.   ondansetron (ZOFRAN-ODT) 8 MG disintegrating tablet Take 1 tablet (8 mg total) by mouth every 8 (eight) hours as needed for nausea or vomiting.   Riboflavin (B2 PO) Take by mouth.   tiZANidine (ZANAFLEX) 4 MG tablet Take 1 tablet (4 mg total) by mouth at bedtime as needed for muscle spasms.   VITAMIN D PO Take by mouth.  No facility-administered encounter medications on file as of 07/03/2022.     Lab Results  Component Value Date   WBC 4.7 04/23/2022   HGB 12.3 04/23/2022   HCT 36.7 04/23/2022   PLT 303 04/23/2022   GLUCOSE 94 04/23/2022   CHOL 170 04/23/2022   TRIG 96 04/23/2022   HDL 52 04/23/2022   LDLCALC 99 04/23/2022   ALT 15 04/23/2022   AST 18 04/23/2022   NA 138 04/23/2022   K 4.0 04/23/2022   CL 106 04/23/2022   CREATININE 0.50 04/23/2022   BUN 14 04/23/2022   CO2 25 04/23/2022   TSH 0.989 04/23/2022   HGBA1C 5.5 10/05/2018    No results found.     Assessment & Plan:  There are no diagnoses linked to this encounter.   Dale Screven, MD

## 2022-07-04 ENCOUNTER — Encounter: Payer: Self-pay | Admitting: Internal Medicine

## 2022-07-04 NOTE — Assessment & Plan Note (Signed)
History of migraine headaches as outlined.  Start magnesium oxide 400mg  q day.  Follow.

## 2022-07-04 NOTE — Assessment & Plan Note (Signed)
Seeing Dr Martha Clan.  Melxicam and flexeril should help shoulder as well.  Follow.

## 2022-07-04 NOTE — Assessment & Plan Note (Signed)
Low back pain as outlined.  No radicular symptoms.  Treat with  meloxicam.  Flexeril - before bed.  Can add tylenol if needed.  Discussed PT.  Will notify me if desires PT referral.

## 2022-07-04 NOTE — Assessment & Plan Note (Signed)
Discussed benefiber.  Also adding magoxide for headache prevention.  Should help with bowels.  Follow.

## 2022-07-06 ENCOUNTER — Other Ambulatory Visit: Payer: Self-pay

## 2022-07-07 ENCOUNTER — Encounter: Payer: Self-pay | Admitting: Internal Medicine

## 2022-07-07 DIAGNOSIS — M545 Low back pain, unspecified: Secondary | ICD-10-CM

## 2022-07-07 NOTE — Telephone Encounter (Signed)
Order placed for PT referral.  

## 2022-07-07 NOTE — Therapy (Unsigned)
OUTPATIENT PHYSICAL THERAPY NEURO EVALUATION   Patient Name: Monica Kaiser MRN: [id number] DOB:1975-03-16, 47 y.o., female Today's Date: 07/08/2022   PCP:   Dale Burns, MD   REFERRING PROVIDER:   Dale [place], MD    END OF SESSION:  PT End of Session - 07/08/22 0901     Visit Number 1    Number of Visits 16    Date for PT Re-Evaluation 09/02/22    Progress Note Due on Visit 10    PT Start Time 0800    PT Stop Time 0843    PT Time Calculation (min) 43 min    Activity Tolerance Patient tolerated treatment well;Patient limited by pain    Behavior During Therapy [facility] for tasks assessed/performed             Past Medical History:  Diagnosis Date   Anxiety    Fatigue    Low back pain    Migraine    Miscarriage    Past Surgical History:  Procedure Laterality Date   BREAST CYST EXCISION Right 2003   benign    COLONOSCOPY  2004   Patient Active Problem List   Diagnosis Date Noted   Breast mass, right 09/14/2021   Irritable bowel syndrome with constipation 07/09/2020   Chronic constipation 07/09/2020   Sinus tachycardia 11/20/2019   PAC (premature atrial contraction) 11/20/2019   PVC's (premature ventricular contractions) 11/20/2019   Chronic left shoulder pain 11/20/2019   Palpitations 11/20/2019   Tingling of right upper extremity 10/05/2018   Annual physical exam 10/05/2018   Intractable migraine without status migrainosus 11/26/2017   Fatigue 11/26/2017   Low back pain 11/26/2017   Anxiety 08/24/2016   Benign cyst of right breast 08/24/2016   Headache 03/15/2013    ONSET DATE: 1 week ago Tuesday  REFERRING DIAG: Low Back Pain  THERAPY DIAG:  Other low back pain  Rationale for Evaluation and Treatment: Rehabilitation  SUBJECTIVE:                                                                                                                                                                                             SUBJECTIVE  STATEMENT:  Pt is a Designer, jewellery, 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 Since then the pt has noticed a baseline pain level of 4/10 when moving around, and increases to 10/10 when seated or laying down. She notes nothing alleviating the pain other than positional changes. She is familiar with dry needling and is  interested in the possibility this episode of care.  Pt accompanied by: self  PERTINENT HISTORY: Pt presents to physical therapy for low back pain, specified acute flare up with history of generalized low back pain. Additional PMH includes: Headache, fatigue, PAC/PVC's, chronic constipation, and anxiety. Has "pulled" her lower back roughly 1.5 weeks ago and is been unable to find much pain relief since. Currently limited in most functional transitions but is still currently working. She is familiar with dry needling with good results in the past, and is interested in the possibility this episode of care.  PAIN:  Are you having pain?  Yes: NPRS scale: standing: 4/10, laying down or seated up to 10/10 Pain location: Low back Pain description: ache, feels tight Aggravating factors: sitting or laying down for a persiod of time Relieving factors: no alleviating other than movement   PRECAUTIONS: None  WEIGHT BEARING RESTRICTIONS: No  FALLS: Has patient fallen in last 6 months? No  LIVING ENVIRONMENT: Lives with: lives with their family Lives in: House/apartment Stairs: Yes: Internal: 15 steps; can reach both and External: 6 steps; can reach both Has following equipment at home: None  PLOF: Independent  PATIENT GOALS: Pain releif  OBJECTIVE:   COGNITION: Overall cognitive status: Within functional limits for tasks assessed  LOWER EXTREMITY ROM:    *pt having lumbar flexion and extension greatly limited by pain.    Right Eval Left Eval  Hip flexion    Hip extension     Hip abduction    Hip adduction    Hip internal rotation    Hip external rotation    Knee flexion    Knee extension    Ankle dorsiflexion    Ankle plantarflexion    Ankle inversion    Ankle eversion     (Blank rows = not tested)   BED MOBILITY:  Slow transitions d/t LBP,   TRANSFERS: Assistive device utilized: None  Sit to stand:  Need use of UE to lift up, upper body kept stiff, no anterior weight shift d/t LBP  GAIT: Gait pattern: WFL Assistive device utilized: None Level of assistance: Complete Independence Comments: walking around and doing functional tasks seem to be the least aggravating activity.  FUNCTIONAL TESTS:  5 times sit to stand: Perform 2nd session to get baseline, pt only able to stand with use of UE currently.  PATIENT SURVEYS:  Modified Oswestry 38%  FOTO 56, potential for 76  Palpation palpation reveals focal point pain superficially located laterally to the left of L5 vertebrae just superior to Superior end of SI joint.  TODAY'S TREATMENT:                                                                                                                              DATE: 07/08/22 Pt's initial evaluation preformed on this day, had tests and measures found below: FOTO, ODI.  Therex: -Physioball rollouts 2 x 10 anteriorly, 1 x 6 to lateral sides,  Lean to R side causes symptoms. -prirformis stretch ea leg x 45 sec hold -Attempted anterior and posterior pelvic tilts in supine with some success, note range is slightly limited by muscle activation and pain. -Attempted TrA activation x 6 with some success, provided demonstration, VC and TC's.  Manual Therapy: palpation reveals focal point pain superficially located laterally to the left of L5 vertebrae just superior to Superior end of SI joint.   -TrP release on pt's L lower back in prone, with intermittent STM to same area. Applying moderate pressure a muscle twitch was felt. Though focal pain found, pt's  primary concern is deep general low back pain in a band pattern across the waist. Upon sitting back up, pt found little to no relief, but no increase in pain.    PATIENT EDUCATION: Education details: Educated on Tests and outcome measures, general POC, possibility of dry needling. Additionally educated on core activation throughout functional transfers. Person educated: Patient Education method: Explanation, Demonstration, Tactile cues, and Verbal cues Education comprehension: verbalized understanding  HOME EXERCISE PROGRAM: Provide 2nd session.  GOALS: Goals reviewed with patient? No  SHORT TERM GOALS: Target date: 08/05/2022     Patient will be independent in home exercise program to improve strength/mobility for better functional independence with ADLs. Baseline: No HEP currently  Goal status: INITIAL   LONG TERM GOALS: Target date: 09/02/2022   1.  Patient will increase FOTO score to equal to or greater than  76  to demonstrate statistically significant improvement in mobility and quality of life.  Baseline: 56 Goal status: INITIAL 2.  Patient will increase ODI score to equal to or greater than  score of 24  to demonstrate statistically significant improvement in mobility and quality of life.  Baseline: score of 19, 38% Goal status: INITIAL 3.  Patient will complete five times sit to stand in < 15 seconds without use of UE and no increase in pain, indicating an increased LE strength, balance, and functional ability. Baseline: Cannot perform STS without use of UE, motion is painful Goal status: INITIAL 4.  Patient will be able to sleep through the night on normal schedule without interruptions due to back pain.  Baseline: per ODI, sleep limited to only 3/4 of normal. Goal status: INITIAL  5.  Patient will show greater Lumber ROM by being able to lift objects, and complete chores while maintaining a proper functional position without pain. Baseline: per ODI, lifting heavier  objects and completing work/home related tasks are limited by pain. Goal status: INITIAL     ASSESSMENT:  CLINICAL IMPRESSION: Patient is a 47 y.o. female who was seen today for physical therapy evaluation and treatment for low back pain. Pt baseline pain sitting at 4/10 excitable to 10/10 when seated or standing. Difficult for pt to find comfortable positions other than standing. Pt is able complete work and daily tasks but pain is persisting throughout the day and pt has pain with some of these tasks. Palpation of lower back revealed trigger points, SPT was able to release with twitch response but afterworld pt reported little to no change in pain level. Light stretching and lower back movement in seated caused no pain increases. She is interested in dry needling and has had good benefits from it in the past. Pt will continue to benefit from skilled physical therapy intervention to address impairments, improve QOL, and attain therapy goals.    OBJECTIVE IMPAIRMENTS: decreased mobility, decreased ROM, and improper body mechanics.   ACTIVITY LIMITATIONS: carrying, lifting,  bending, sitting, squatting, and bed mobility  PARTICIPATION LIMITATIONS: meal prep, cleaning, laundry, driving, community activity, and occupation  PERSONAL FACTORS: Past/current experiences and Time since onset of injury/illness/exacerbation are also affecting patient's functional outcome.   REHAB POTENTIAL: Good  CLINICAL DECISION MAKING: Stable/uncomplicated  EVALUATION COMPLEXITY: Low  PLAN:  PT FREQUENCY: 1-2x/week  PT DURATION: 8 weeks  PLANNED INTERVENTIONS: Therapeutic exercises, Therapeutic activity, Neuromuscular re-education, Balance training, Gait training, Patient/Family education, Self Care, Joint mobilization, Joint manipulation, Dry Needling, Spinal manipulation, Spinal mobilization, Cryotherapy, Moist heat, Manual therapy, and Re-evaluation  PLAN FOR NEXT SESSION: Provide HEP, Dry needling if  possible followed up with core activation exercises and functional strengthening.   Norman Herrlich, PT 07/08/2022, 4:22 PM

## 2022-07-08 ENCOUNTER — Encounter: Payer: Self-pay | Admitting: Physical Therapy

## 2022-07-08 ENCOUNTER — Ambulatory Visit: Payer: Commercial Managed Care - PPO | Attending: Internal Medicine | Admitting: Physical Therapy

## 2022-07-08 DIAGNOSIS — M545 Low back pain, unspecified: Secondary | ICD-10-CM | POA: Insufficient documentation

## 2022-07-08 DIAGNOSIS — M5459 Other low back pain: Secondary | ICD-10-CM | POA: Insufficient documentation

## 2022-07-08 DIAGNOSIS — M25561 Pain in right knee: Secondary | ICD-10-CM | POA: Insufficient documentation

## 2022-07-13 ENCOUNTER — Ambulatory Visit: Payer: Commercial Managed Care - PPO

## 2022-07-13 DIAGNOSIS — M25561 Pain in right knee: Secondary | ICD-10-CM

## 2022-07-13 DIAGNOSIS — M5459 Other low back pain: Secondary | ICD-10-CM | POA: Diagnosis not present

## 2022-07-13 DIAGNOSIS — M545 Low back pain, unspecified: Secondary | ICD-10-CM | POA: Diagnosis not present

## 2022-07-13 NOTE — Therapy (Signed)
OUTPATIENT PHYSICAL THERAPY NEURO TREATMENT   Patient Name: Monica Kaiser MRN: [id number] DOB:[birth year], 47 y.o., female Today's Date: 07/13/2022   PCP:   Dale [place], MD   REFERRING PROVIDER:   Dale [place], MD    END OF SESSION:  PT End of Session - 07/13/22 0708     Visit Number 2    Number of Visits 16    Date for PT Re-Evaluation 09/02/22    Progress Note Due on Visit 10    PT Start Time 0710    PT Stop Time 0753    PT Time Calculation (min) 43 min    Activity Tolerance Patient tolerated treatment well;Patient limited by pain    Behavior During Therapy [facility] for tasks assessed/performed              Past Medical History:  Diagnosis Date   Anxiety    Fatigue    Low back pain    Migraine    Miscarriage    Past Surgical History:  Procedure Laterality Date   BREAST CYST EXCISION Right 2003   benign    COLONOSCOPY  2004   Patient Active Problem List   Diagnosis Date Noted   Breast mass, right 09/14/2021   Irritable bowel syndrome with constipation 07/09/2020   Chronic constipation 07/09/2020   Sinus tachycardia 11/20/2019   PAC (premature atrial contraction) 11/20/2019   PVC's (premature ventricular contractions) 11/20/2019   Chronic left shoulder pain 11/20/2019   Palpitations 11/20/2019   Tingling of right upper extremity 10/05/2018   Annual physical exam 10/05/2018   Intractable migraine without status migrainosus 11/26/2017   Fatigue 11/26/2017   Low back pain 11/26/2017   Anxiety 08/24/2016   Benign cyst of right breast 08/24/2016   Headache 03/15/2013    ONSET DATE: 1 week ago Tuesday  REFERRING DIAG: Low Back Pain  THERAPY DIAG:  Other low back pain  Acute pain of right knee  Rationale for Evaluation and Treatment: Rehabilitation  SUBJECTIVE:                                                                                                                                                                                              SUBJECTIVE STATEMENT: Patient reports her back continues to be painful. Felt slightly better with ice but quickly returned to painful.   Pt accompanied by: self  PERTINENT HISTORY: Pt presents to physical therapy for low back pain, specified acute flare up with history of generalized low back pain. Additional PMH includes: Headache, fatigue, PAC/PVC's, chronic constipation, and anxiety. Has "pulled" her lower back roughly 1.5 weeks ago and is been  unable to find much pain relief since. Currently limited in most functional transitions but is still currently working. She is familiar with dry needling with good results in the past, and is interested in the possibility this episode of care.  PAIN:  Are you having pain?  Yes: NPRS scale: standing: 4/10, laying down or seated up to 10/10 Pain location: Low back Pain description: ache, feels tight Aggravating factors: sitting or laying down for a persiod of time Relieving factors: no alleviating other than movement   PRECAUTIONS: None  WEIGHT BEARING RESTRICTIONS: No  FALLS: Has patient fallen in last 6 months? No  LIVING ENVIRONMENT: Lives with: lives with their family Lives in: House/apartment Stairs: Yes: Internal: 15 steps; can reach both and External: 6 steps; can reach both Has following equipment at home: None  PLOF: Independent  PATIENT GOALS: Pain releif  OBJECTIVE:   COGNITION: Overall cognitive status: Within functional limits for tasks assessed  LOWER EXTREMITY ROM:    *pt having lumbar flexion and extension greatly limited by pain.    Right Eval Left Eval  Hip flexion    Hip extension    Hip abduction    Hip adduction    Hip internal rotation    Hip external rotation    Knee flexion    Knee extension    Ankle dorsiflexion    Ankle plantarflexion    Ankle inversion    Ankle eversion     (Blank rows = not tested)   BED MOBILITY:  Slow transitions d/t LBP,   TRANSFERS: Assistive device utilized:  None  Sit to stand:  Need use of UE to lift up, upper body kept stiff, no anterior weight shift d/t LBP  GAIT: Gait pattern: WFL Assistive device utilized: None Level of assistance: Complete Independence Comments: walking around and doing functional tasks seem to be the least aggravating activity.  FUNCTIONAL TESTS:  5 times sit to stand: Perform 2nd session to get baseline, pt only able to stand with use of UE currently.  PATIENT SURVEYS:  Modified Oswestry 38%  FOTO 56, potential for 76  Palpation palpation reveals focal point pain superficially located laterally to the left of L5 vertebrae just superior to Superior end of SI joint.  TODAY'S TREATMENT:                                                                                                                              DATE: 07/13/22   Therex: Posterior pelvic tilt 10x 5-10 second holds  Posterior pelvic tilt with adduction ball squeeze 10x TrA activation with green swiss ball 10x  Dead bug TrA activation 10x LE rotation 60 seconds x2 trials   Manual: Hamstring lengthening stretch 60 seconds each LE Figure four stretch 60 seconds each LE Single knee to chest 60 seconds each LE  Distraction with belt: SAD inferior 30 seconds x 3 trials  Grade II mobilizations x 4 minutes low back    Trigger  Point Dry Needling (TDN), unbilled Education performed with patient regarding potential benefit of TDN. Reviewed precautions and risks with patient. Reviewed special precautions/risks over lung fields which include pneumothorax. Reviewed signs and symptoms of pneumothorax and advised pt to go to ER immediately if these symptoms develop advise them of dry needling treatment. Extensive time spent with pt to ensure full understanding of TDN risks. Pt provided verbal consent to treatment. TDN performed to  with 0.25 x 40 single needle placements with local twitch response (LTR). Pistoning technique utilized. Improved pain-free motion  following intervention. Performed to lumbar paraspinals and piriformis L side x 3 minutes     PATIENT EDUCATION: Education details: Educated on Tests and outcome measures, general POC, possibility of dry needling. Additionally educated on core activation throughout functional transfers. Person educated: Patient Education method: Explanation, Demonstration, Tactile cues, and Verbal cues Education comprehension: verbalized understanding  HOME EXERCISE PROGRAM: Provide 2nd session.  GOALS: Goals reviewed with patient? No  SHORT TERM GOALS: Target date: 08/05/2022     Patient will be independent in home exercise program to improve strength/mobility for better functional independence with ADLs. Baseline: No HEP currently  Goal status: INITIAL   LONG TERM GOALS: Target date: 09/02/2022   1.  Patient will increase FOTO score to equal to or greater than  76  to demonstrate statistically significant improvement in mobility and quality of life.  Baseline: 56 Goal status: INITIAL 2.  Patient will increase ODI score to equal to or greater than  score of 24  to demonstrate statistically significant improvement in mobility and quality of life.  Baseline: score of 19, 38% Goal status: INITIAL 3.  Patient will complete five times sit to stand in < 15 seconds without use of UE and no increase in pain, indicating an increased LE strength, balance, and functional ability. Baseline: Cannot perform STS without use of UE, motion is painful Goal status: INITIAL 4.  Patient will be able to sleep through the night on normal schedule without interruptions due to back pain.  Baseline: per ODI, sleep limited to only 3/4 of normal. Goal status: INITIAL  5.  Patient will show greater Lumber ROM by being able to lift objects, and complete chores while maintaining a proper functional position without pain. Baseline: per ODI, lifting heavier objects and completing work/home related tasks are limited by  pain. Goal status: INITIAL     ASSESSMENT:  CLINICAL IMPRESSION: Patient introduced to TDN, educated on side effects and benefits. Patient benefits from prolonged holds for muscle tissue lengthening. She does have increased muscle tension/limitations of R hamstring compared to L hamstring. Patient is eager to progress her functional pain free mobility. Posterior tilts with adduction squeeze added to POC. Pt will continue to benefit from skilled physical therapy intervention to address impairments, improve QOL, and attain therapy goals.    OBJECTIVE IMPAIRMENTS: decreased mobility, decreased ROM, and improper body mechanics.   ACTIVITY LIMITATIONS: carrying, lifting, bending, sitting, squatting, and bed mobility  PARTICIPATION LIMITATIONS: meal prep, cleaning, laundry, driving, community activity, and occupation  PERSONAL FACTORS: Past/current experiences and Time since onset of injury/illness/exacerbation are also affecting patient's functional outcome.   REHAB POTENTIAL: Good  CLINICAL DECISION MAKING: Stable/uncomplicated  EVALUATION COMPLEXITY: Low  PLAN:  PT FREQUENCY: 1-2x/week  PT DURATION: 8 weeks  PLANNED INTERVENTIONS: Therapeutic exercises, Therapeutic activity, Neuromuscular re-education, Balance training, Gait training, Patient/Family education, Self Care, Joint mobilization, Joint manipulation, Dry Needling, Spinal manipulation, Spinal mobilization, Cryotherapy, Moist heat, Manual therapy, and Re-evaluation  PLAN FOR  NEXT SESSION: Provide HEP, Dry needling if possible followed up with core activation exercises and functional strengthening.   Precious Bard, PT 07/13/2022, 8:14 AM

## 2022-07-15 ENCOUNTER — Ambulatory Visit: Payer: Commercial Managed Care - PPO | Attending: Medical | Admitting: Medical

## 2022-07-15 ENCOUNTER — Ambulatory Visit (INDEPENDENT_AMBULATORY_CARE_PROVIDER_SITE_OTHER): Payer: Commercial Managed Care - PPO

## 2022-07-15 ENCOUNTER — Encounter: Payer: Commercial Managed Care - PPO | Admitting: Physical Therapy

## 2022-07-15 ENCOUNTER — Encounter: Payer: Self-pay | Admitting: Medical

## 2022-07-15 VITALS — BP 100/76 | HR 61 | Ht 66.0 in | Wt 135.4 lb

## 2022-07-15 DIAGNOSIS — R002 Palpitations: Secondary | ICD-10-CM | POA: Diagnosis not present

## 2022-07-15 DIAGNOSIS — R079 Chest pain, unspecified: Secondary | ICD-10-CM

## 2022-07-15 DIAGNOSIS — I491 Atrial premature depolarization: Secondary | ICD-10-CM

## 2022-07-15 DIAGNOSIS — I493 Ventricular premature depolarization: Secondary | ICD-10-CM

## 2022-07-15 NOTE — Progress Notes (Signed)
Cardiology Office Note:    Date:  07/15/2022   ID:  Monica Kaiser, DOB Aug 14, 1975, MRN [id number]  PCP:  Monica [place], MD  [facility] Cardiologist:  Monica Bears, MD  [facility] Electrophysiologist:  None   Referring MD: Monica [place], MD   Chief Complaint: overdue follow-up  History of Present Illness:    Monica Kaiser is a 47 y.o. female with a hx of tachypalpitations, COVID-19 in December 2020 not requiring hospital admission, migraines, and anxiety who presents for overdue follow-up.  The patient was evaluated in 2020 for tachypalpitations.  Heart monitor showed normal sinus rhythm with an average heart rate of 86 bpm, isolated PACs and PVCs less than 1% burden.  Triggered events associated with tachycardia.  Patient was last seen in November 2021 reporting an increase in tachypalpitations and chest discomfort.  Patient stated initial Zio patch was worn while she had COVID.  A repeat heart monitor was ordered.  Heart monitor showed normal sinus rhythm with an average heart rate of 88 bpm, rare PACs and rare PVCs, triggered events did not correlate with an arrhythmia.  Today, the patient reports she is overall doing well. She noted that the Apple showed a heart rate up to 199bpm. At the time she was going to have dinner at a friend. Heart rate has gone up into 160s as well. She is not super active at baseline. She has no symptoms. She has rare heart racing. No chest pain, shortness of breath, lightheadedness or dizziness, lower leg edema, orthopnea or pnd.   Past Medical History:  Diagnosis Date   Anxiety    Fatigue    Low back pain    Migraine    Miscarriage     Past Surgical History:  Procedure Laterality Date   BREAST CYST EXCISION Right 2003   benign    COLONOSCOPY  2004    Current Medications: Current Meds  Medication Sig   Ascorbic Acid (VITAMIN C PO) Take by mouth.   cyclobenzaprine (FLEXERIL) 5 MG tablet Take 1 tablet (5 mg total) by mouth  every 8 (eight) hours.   eletriptan (RELPAX) 40 MG tablet Take 40 mg by mouth as needed for migraine or headache. (can repeat in 1-2 hrs if no response), no more than 80 mg in one day and no more than 160 mg / week.   magnesium oxide (MAG-OX) 400 MG tablet Take 1 tablet (400 mg total) by mouth daily.   MAGNESIUM PO Take by mouth.   Multiple Vitamins-Minerals (ZINC PO) Take by mouth.   norgestimate-ethinyl estradiol (ORTHO-CYCLEN) 0.25-35 MG-MCG tablet Take 1 tablet by mouth daily.   ondansetron (ZOFRAN-ODT) 8 MG disintegrating tablet Take 1 tablet (8 mg total) by mouth every 8 (eight) hours as needed for nausea or vomiting.   Riboflavin (B2 PO) Take by mouth.   tiZANidine (ZANAFLEX) 4 MG tablet Take 1 tablet (4 mg total) by mouth at bedtime as needed for muscle spasms.   VITAMIN D PO Take by mouth.     Allergies:   Patient has no known allergies.   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 There is no history of Breast cancer.  ROS:   Please see the history of present illness.     All other systems reviewed and are negative.  EKGs/Labs/Other Studies Reviewed:    The following studies were reviewed today:  Heart monitor 12/2019 14-day ZIO monitor:   Normal sinus rhythm with an average heart rate of 88 bpm. Rare PACs and rare PVCs. Triggered events did not correlate with arrhythmia. No significant arrhythmia.  Heart monitor 01/2019` 2-week Zio patch monitor:   Normal sinus rhythm with an average heart rate of 86 bpm. Isolated PACs and PVCs with less than 1% burden. Most triggered events correlated with sinus tachycardia. Highest sinus tachycardia rate was around 200 bpm.  EKG:  EKG is ordered today.  The ekg ordered today demonstrates NSR, 61bpm, TW flattening aVL  Recent Labs: 04/23/2022: ALT 15; BUN 14; Creatinine, Ser 0.50; Hemoglobin 12.3; Platelets 303; Potassium 4.0; Sodium 138; TSH 0.989  Recent Lipid Panel    Component Value Date/Time   CHOL 170 04/23/2022 0742   TRIG 96 04/23/2022 0742   HDL 52 04/23/2022 0742   CHOLHDL 3.3 04/23/2022 0742   VLDL 19 04/23/2022 0742   LDLCALC 99 04/23/2022 0742     Physical Exam:    VS:  BP 100/76 (BP Location: Left Arm, Patient Position: Sitting, Cuff Size: Normal)   Pulse 61   Ht 5\' 6"  (1.676 m)   Wt 135 lb 6 oz (61.4 kg)   SpO2 98%   BMI 21.85 kg/m     Wt Readings from Last 3 Encounters:  07/15/22 135 lb 6 oz (61.4 kg)  07/03/22 136 lb (61.7 kg)  04/20/22 137 lb (62.1 kg)     GEN:  Well nourished, well developed in no acute distress HEENT: Normal NECK: No JVD; No carotid bruits LYMPHATICS: No lymphadenopathy CARDIAC: RRR, no murmurs, rubs, gallops RESPIRATORY:  Clear to auscultation without rales, wheezing or rhonchi  ABDOMEN: Soft, non-tender, non-distended MUSCULOSKELETAL:  No edema; No deformity  SKIN: Warm and  dry NEUROLOGIC:  Alert and oriented x 3 PSYCHIATRIC:  Normal affect   ASSESSMENT:    1. Palpitations   2. Chest pain of uncertain etiology    PLAN:    In order of problems listed above:  Palpitations Patient reports elevated heart rates, up to 199 on her Apple Watch. This was not during activity. She has no significant symptoms, just occasional heart fluttering. Labs in April were unremarkable. EKG today shows NSR with a heart rate of 61bpm. Prior heart monitor showed NSR with rare PACs/PVCs.  I will repeat a 2 week heart monitor.   Chest pain She reports chest discomfort that may be acid reflux. I will check an echo.  Disposition: Follow up in 4-6 week(s) with MD/APP    Signed, Rino Hosea David Stall, PA-C  07/15/2022 12:03 PM    [facility]

## 2022-07-15 NOTE — Patient Instructions (Signed)
Medication Instructions:  The current medical regimen is effective;  continue present plan and medications.  *If you need a refill on your cardiac medications before your next appointment, please call your pharmacy*   Testing/Procedures: Your physician has requested that you have an echocardiogram. Echocardiography is a painless test that uses sound waves to create images of your heart. It provides your doctor with information about the size and shape of your heart and how well your heart's chambers and valves are working.   You may receive an ultrasound enhancing agent through an IV if needed to better visualize your heart during the echo. This procedure takes approximately one hour.  There are no restrictions for this procedure.  This will take place at [address] [address], [place] [address]  Christena Deem- Long Term Monitor Instructions  Your physician has requested you wear a ZIO patch monitor for 14 days.  This is a single patch monitor. [employer] supplies one patch monitor per enrollment. Additional stickers are not available. Please do not apply patch if you will be having a Nuclear Stress Test,  Echocardiogram, Cardiac CT, MRI, or Chest Xray during the period you would be wearing the  monitor. The patch cannot be worn during these tests. You cannot remove and re-apply the  ZIO XT patch monitor.  Your ZIO patch monitor will be mailed 3 day USPS to your address on file. It may take 3-5 days  to receive your monitor after you have been enrolled.  Once you have received your monitor, please review the enclosed instructions. Your monitor  has already been registered assigning a specific monitor serial # to you.  Billing and Patient Assistance Program Information  We have supplied [employer] with any of your insurance information on file for billing purposes. [employer] offers a sliding scale Patient Assistance Program for patients that do not have  insurance, or whose  insurance does not completely cover the cost of the ZIO monitor.  You must apply for the Patient Assistance Program to qualify for this discounted rate.  To apply, please call [employer] at [phone], select option 4, select option 2, ask to apply for  Patient Assistance Program. Meredeth Ide will ask your household income, and how many people  are in your household. They will quote your out-of-pocket cost based on that information.  [employer] will also be able to set up a 24-month, interest-free payment plan if needed.  Applying the monitor   Shave hair from upper left chest.  Hold abrader disc by orange tab. Rub abrader in 40 strokes over the upper left chest as  indicated in your monitor instructions.  Clean area with 4 enclosed alcohol pads. Let dry.  Apply patch as indicated in monitor instructions. Patch will be placed under collarbone on left  side of chest with arrow pointing upward.  Rub patch adhesive wings for 2 minutes. Remove white label marked "1". Remove the white  label marked "2". Rub patch adhesive wings for 2 additional minutes.  While looking in a mirror, press and release button in center of patch. A small green light will  flash 3-4 times. This will be your only indicator that the monitor has been turned on.  Do not shower for the first 24 hours. You may shower after the first 24 hours.  Press the button if you feel a symptom. You will hear a small click. Record Date, Time and  Symptom in the Patient Logbook.  When you are ready to remove the  patch, follow instructions on the last 2 pages of Patient  Logbook. Stick patch monitor onto the last page of Patient Logbook.  Place Patient Logbook in the blue and white box. Use locking tab on box and tape box closed  securely. The blue and white box has prepaid postage on it. Please place it in the mailbox as  soon as possible. Your physician should have your test results approximately 7 days after the  monitor has been mailed back  to [facility].  Call [facility] Customer Care at [phone] if you have questions regarding  your ZIO XT patch monitor. Call them immediately if you see an orange light blinking on your  monitor.  If your monitor falls off in less than 4 days, contact our Monitor department at [phone].  If your monitor becomes loose or falls off after 4 days call [employer] at [phone] for  suggestions on securing your monitor    Follow-Up: At [facility], you and your health needs are our priority.  As part of our continuing mission to provide you with exceptional heart care, we have created designated Provider Care Teams.  These Care Teams include your primary Cardiologist (physician) and Advanced Practice Providers (APPs -  Physician Assistants and Nurse Practitioners) who all work together to provide you with the care you need, when you need it.  We recommend signing up for the patient portal called "MyChart".  Sign up information is provided on this After Visit Summary.  MyChart is used to connect with patients for Virtual Visits (Telemedicine).  Patients are able to view lab/test results, encounter notes, upcoming appointments, etc.  Non-urgent messages can be sent to your provider as well.   To learn more about what you can do with MyChart, go to [email or web address].    Your next appointment:   4-6 week(s)  Provider:   You may see [name], MD or one of the following Advanced Practice Providers on your designated Care Team:   Nicolasa Ducking, NP Eula Listen, PA-C Cadence Fransico Michael, PA-C Charlsie Quest, NP

## 2022-07-16 ENCOUNTER — Encounter: Payer: Self-pay | Admitting: Internal Medicine

## 2022-07-17 ENCOUNTER — Ambulatory Visit: Payer: Commercial Managed Care - PPO | Admitting: Physical Therapy

## 2022-07-19 ENCOUNTER — Other Ambulatory Visit: Payer: Self-pay

## 2022-07-20 ENCOUNTER — Other Ambulatory Visit: Payer: Self-pay

## 2022-07-21 ENCOUNTER — Encounter: Payer: Self-pay | Admitting: Internal Medicine

## 2022-07-21 ENCOUNTER — Ambulatory Visit: Payer: Commercial Managed Care - PPO | Admitting: Cardiovascular Disease

## 2022-07-21 DIAGNOSIS — M545 Low back pain, unspecified: Secondary | ICD-10-CM

## 2022-07-21 NOTE — Telephone Encounter (Signed)
Update on back pain. She is requesting celebrex. No relief with meloxicam or muscle relaxers. PT is not helping. Also, would like MRI ordered. She is aware you are out of the office.

## 2022-07-21 NOTE — Telephone Encounter (Signed)
I do not mind ordering an MRI, but will need to check L-S spine xray first.  Please schedule for L-s spine xray.  I have ordered the xray.  Regarding request for celebrex, statin meloxicam not working.  Confirm nkda. If no allergies, then ok to stop (cancel rx for meloxicam) and send in rx celebrex 200mg  q day prn.  (#30 with no refills).  Take with food.

## 2022-07-22 ENCOUNTER — Other Ambulatory Visit: Payer: Self-pay

## 2022-07-22 MED ORDER — CELECOXIB 200 MG PO CAPS
200.0000 mg | ORAL_CAPSULE | Freq: Every day | ORAL | 0 refills | Status: DC | PRN
  Filled 2022-07-22: qty 30, 30d supply, fill #0

## 2022-07-22 NOTE — Telephone Encounter (Signed)
Spoke with patient. Confirmed nkda. Sent in celebrex. DC meloxicam. Will schedule for xray and my chart patient with time per her request. Requesting to have done 7/25, 7/29, 8/2

## 2022-07-27 ENCOUNTER — Other Ambulatory Visit: Payer: Self-pay | Admitting: Medical

## 2022-07-27 DIAGNOSIS — R079 Chest pain, unspecified: Secondary | ICD-10-CM

## 2022-07-27 DIAGNOSIS — R002 Palpitations: Secondary | ICD-10-CM

## 2022-07-28 ENCOUNTER — Other Ambulatory Visit (HOSPITAL_COMMUNITY): Payer: Self-pay

## 2022-07-29 ENCOUNTER — Other Ambulatory Visit: Payer: Self-pay | Admitting: Medical

## 2022-07-29 DIAGNOSIS — R079 Chest pain, unspecified: Secondary | ICD-10-CM

## 2022-07-29 NOTE — Therapy (Signed)
OUTPATIENT PHYSICAL THERAPY NEURO TREATMENT   Patient Name: Monica Kaiser MRN: [id number] DOB:06-May-1975, 47 y.o., female Today's Date: 07/30/2022   PCP:   Dale Calhan, MD   REFERRING PROVIDER:   Dale [place], MD    END OF SESSION:  PT End of Session - 07/30/22 0755     Visit Number 3    Number of Visits 16    Date for PT Re-Evaluation 09/02/22    Progress Note Due on Visit 10    PT Start Time 0759    PT Stop Time 0844    PT Time Calculation (min) 45 min    Activity Tolerance Patient tolerated treatment well;Patient limited by pain    Behavior During Therapy [facility] for tasks assessed/performed               Past Medical History:  Diagnosis Date   Anxiety    Fatigue    Low back pain    Migraine    Miscarriage    Past Surgical History:  Procedure Laterality Date   BREAST CYST EXCISION Right 2003   benign    COLONOSCOPY  2004   Patient Active Problem List   Diagnosis Date Noted   Breast mass, right 09/14/2021   Irritable bowel syndrome with constipation 07/09/2020   Chronic constipation 07/09/2020   Sinus tachycardia 11/20/2019   PAC (premature atrial contraction) 11/20/2019   PVC's (premature ventricular contractions) 11/20/2019   Chronic left shoulder pain 11/20/2019   Palpitations 11/20/2019   Tingling of right upper extremity 10/05/2018   Annual physical exam 10/05/2018   Intractable migraine without status migrainosus 11/26/2017   Fatigue 11/26/2017   Low back pain 11/26/2017   Anxiety 08/24/2016   Benign cyst of right breast 08/24/2016   Headache 03/15/2013    ONSET DATE: 1 week ago Tuesday  REFERRING DIAG: Low Back Pain  THERAPY DIAG:  Other low back pain  Acute pain of right knee  Rationale for Evaluation and Treatment: Rehabilitation  SUBJECTIVE:                                                                                                                                                                                              SUBJECTIVE STATEMENT: Patient getting and X ray today prior to being able to get an MRI.   Pt accompanied by: self  PERTINENT HISTORY: Pt presents to physical therapy for low back pain, specified acute flare up with history of generalized low back pain. Additional PMH includes: Headache, fatigue, PAC/PVC's, chronic constipation, and anxiety. Has "pulled" her lower back roughly 1.5 weeks ago and is been unable to find  much pain relief since. Currently limited in most functional transitions but is still currently working. She is familiar with dry needling with good results in the past, and is interested in the possibility this episode of care.  PAIN:  Are you having pain?  Yes: NPRS scale: standing: 4/10, laying down or seated up to 10/10 Pain location: Low back Pain description: ache, feels tight Aggravating factors: sitting or laying down for a persiod of time Relieving factors: no alleviating other than movement   PRECAUTIONS: None  WEIGHT BEARING RESTRICTIONS: No  FALLS: Has patient fallen in last 6 months? No  LIVING ENVIRONMENT: Lives with: lives with their family Lives in: House/apartment Stairs: Yes: Internal: 15 steps; can reach both and External: 6 steps; can reach both Has following equipment at home: None  PLOF: Independent  PATIENT GOALS: Pain releif  OBJECTIVE:   COGNITION: Overall cognitive status: Within functional limits for tasks assessed  LOWER EXTREMITY ROM:    *pt having lumbar flexion and extension greatly limited by pain.    Right Eval Left Eval  Hip flexion    Hip extension    Hip abduction    Hip adduction    Hip internal rotation    Hip external rotation    Knee flexion    Knee extension    Ankle dorsiflexion    Ankle plantarflexion    Ankle inversion    Ankle eversion     (Blank rows = not tested)   BED MOBILITY:  Slow transitions d/t LBP,   TRANSFERS: Assistive device utilized: None  Sit to stand:  Need use of UE to lift  up, upper body kept stiff, no anterior weight shift d/t LBP  GAIT: Gait pattern: WFL Assistive device utilized: None Level of assistance: Complete Independence Comments: walking around and doing functional tasks seem to be the least aggravating activity.  FUNCTIONAL TESTS:  5 times sit to stand: Perform 2nd session to get baseline, pt only able to stand with use of UE currently.  PATIENT SURVEYS:  Modified Oswestry 38%  FOTO 56, potential for 76  Palpation palpation reveals focal point pain superficially located laterally to the left of L5 vertebrae just superior to Superior end of SI joint.  TODAY'S TREATMENT:                                                                                                                              DATE: 07/30/22   Therex: Posterior pelvic tilt 10x 5-10 second holds  Posterior pelvic tilt with adduction ball squeeze 10x TrA activation with green swiss ball 10x  Dead bug TrA activation 10x Hamstring curl with swiss ball 10x  LE rotation 60 seconds x2 trials Open prayer stretch 30 seconds x 3 trials Large swiss ball forward trunk rollout 10x 10 second holds  Standing hamstring stretch 30 seconds x2 trials    Manual: Hamstring lengthening stretch 60 seconds each LE Figure four stretch 60 seconds each LE Single  knee to chest 60 seconds each LE       PATIENT EDUCATION: Education details: Educated on Tests and outcome measures, general POC, possibility of dry needling. Additionally educated on core activation throughout functional transfers. Person educated: Patient Education method: Explanation, Demonstration, Tactile cues, and Verbal cues Education comprehension: verbalized understanding  HOME EXERCISE PROGRAM: Provide 2nd session.  GOALS: Goals reviewed with patient? No  SHORT TERM GOALS: Target date: 08/05/2022     Patient will be independent in home exercise program to improve strength/mobility for better functional  independence with ADLs. Baseline: No HEP currently  Goal status: INITIAL   LONG TERM GOALS: Target date: 09/02/2022   1.  Patient will increase FOTO score to equal to or greater than  76  to demonstrate statistically significant improvement in mobility and quality of life.  Baseline: 56 Goal status: INITIAL 2.  Patient will increase ODI score to equal to or greater than  score of 24  to demonstrate statistically significant improvement in mobility and quality of life.  Baseline: score of 19, 38% Goal status: INITIAL 3.  Patient will complete five times sit to stand in < 15 seconds without use of UE and no increase in pain, indicating an increased LE strength, balance, and functional ability. Baseline: Cannot perform STS without use of UE, motion is painful Goal status: INITIAL 4.  Patient will be able to sleep through the night on normal schedule without interruptions due to back pain.  Baseline: per ODI, sleep limited to only 3/4 of normal. Goal status: INITIAL  5.  Patient will show greater Lumber ROM by being able to lift objects, and complete chores while maintaining a proper functional position without pain. Baseline: per ODI, lifting heavier objects and completing work/home related tasks are limited by pain. Goal status: INITIAL     ASSESSMENT:  CLINICAL IMPRESSION: Patient presents with excellent motivation, she continues to have significant pain and will benefit from MRI to determine causation.  TDN deferred due to upcoming imaging. Core stabilization training tolerated well with no back pain increase. Pt will continue to benefit from skilled physical therapy intervention to address impairments, improve QOL, and attain therapy goals.    OBJECTIVE IMPAIRMENTS: decreased mobility, decreased ROM, and improper body mechanics.   ACTIVITY LIMITATIONS: carrying, lifting, bending, sitting, squatting, and bed mobility  PARTICIPATION LIMITATIONS: meal prep, cleaning, laundry,  driving, community activity, and occupation  PERSONAL FACTORS: Past/current experiences and Time since onset of injury/illness/exacerbation are also affecting patient's functional outcome.   REHAB POTENTIAL: Good  CLINICAL DECISION MAKING: Stable/uncomplicated  EVALUATION COMPLEXITY: Low  PLAN:  PT FREQUENCY: 1-2x/week  PT DURATION: 8 weeks  PLANNED INTERVENTIONS: Therapeutic exercises, Therapeutic activity, Neuromuscular re-education, Balance training, Gait training, Patient/Family education, Self Care, Joint mobilization, Joint manipulation, Dry Needling, Spinal manipulation, Spinal mobilization, Cryotherapy, Moist heat, Manual therapy, and Re-evaluation  PLAN FOR NEXT SESSION: Provide HEP, Dry needling if possible followed up with core activation exercises and functional strengthening.   Precious Bard, PT 07/30/2022, 11:01 AM

## 2022-07-30 ENCOUNTER — Ambulatory Visit (INDEPENDENT_AMBULATORY_CARE_PROVIDER_SITE_OTHER): Payer: Commercial Managed Care - PPO

## 2022-07-30 ENCOUNTER — Other Ambulatory Visit: Payer: Commercial Managed Care - PPO

## 2022-07-30 ENCOUNTER — Ambulatory Visit: Payer: Commercial Managed Care - PPO

## 2022-07-30 DIAGNOSIS — M5459 Other low back pain: Secondary | ICD-10-CM

## 2022-07-30 DIAGNOSIS — M25561 Pain in right knee: Secondary | ICD-10-CM

## 2022-07-30 DIAGNOSIS — M545 Low back pain, unspecified: Secondary | ICD-10-CM

## 2022-07-31 ENCOUNTER — Encounter: Payer: Commercial Managed Care - PPO | Admitting: Physical Therapy

## 2022-08-03 ENCOUNTER — Encounter: Payer: Self-pay | Admitting: Internal Medicine

## 2022-08-03 ENCOUNTER — Telehealth: Payer: Self-pay | Admitting: Internal Medicine

## 2022-08-03 ENCOUNTER — Ambulatory Visit (INDEPENDENT_AMBULATORY_CARE_PROVIDER_SITE_OTHER): Payer: Commercial Managed Care - PPO | Admitting: Internal Medicine

## 2022-08-03 VITALS — BP 110/66 | HR 71 | Temp 98.5°F | Ht 66.0 in | Wt 135.8 lb

## 2022-08-03 DIAGNOSIS — M545 Low back pain, unspecified: Secondary | ICD-10-CM | POA: Diagnosis not present

## 2022-08-03 DIAGNOSIS — M25512 Pain in left shoulder: Secondary | ICD-10-CM

## 2022-08-03 DIAGNOSIS — G8929 Other chronic pain: Secondary | ICD-10-CM | POA: Diagnosis not present

## 2022-08-03 DIAGNOSIS — G43919 Migraine, unspecified, intractable, without status migrainosus: Secondary | ICD-10-CM

## 2022-08-03 DIAGNOSIS — R140 Abdominal distension (gaseous): Secondary | ICD-10-CM

## 2022-08-03 DIAGNOSIS — Z0000 Encounter for general adult medical examination without abnormal findings: Secondary | ICD-10-CM | POA: Diagnosis not present

## 2022-08-03 LAB — LIPID PANEL
Cholesterol: 185 mg/dL (ref 0–200)
HDL: 54.8 mg/dL (ref >39.00)
LDL Cholesterol: 101 mg/dL — ABNORMAL HIGH (ref 0–99)
NonHDL: 129.93
Total CHOL/HDL Ratio: 3
Triglycerides: 144 mg/dL (ref 0.0–149.0)
VLDL: 28.8 mg/dL (ref 0.0–40.0)

## 2022-08-03 LAB — BASIC METABOLIC PANEL
BUN: 8 mg/dL (ref 6–23)
CO2: 26 mEq/L (ref 19–32)
Calcium: 9.5 mg/dL (ref 8.4–10.5)
Chloride: 103 mEq/L (ref 96–112)
Creatinine, Ser: 0.62 mg/dL (ref 0.40–1.20)
GFR: 107.15 mL/min (ref >60.00)
Glucose, Bld: 96 mg/dL (ref 70–99)
Potassium: 4.3 mEq/L (ref 3.5–5.1)
Sodium: 137 mEq/L (ref 135–145)

## 2022-08-03 LAB — HEPATIC FUNCTION PANEL
ALT: 15 U/L (ref 0–35)
AST: 16 U/L (ref 0–37)
Albumin: 4.4 g/dL (ref 3.5–5.2)
Alkaline Phosphatase: 41 U/L (ref 39–117)
Bilirubin, Direct: 0.1 mg/dL (ref 0.0–0.3)
Total Bilirubin: 0.5 mg/dL (ref 0.2–1.2)
Total Protein: 7.6 g/dL (ref 6.0–8.3)

## 2022-08-03 LAB — URINALYSIS, ROUTINE W REFLEX MICROSCOPIC
Bilirubin Urine: NEGATIVE
Ketones, ur: NEGATIVE
Leukocytes,Ua: NEGATIVE
Nitrite: NEGATIVE
Specific Gravity, Urine: 1.02 (ref 1.000–1.030)
Total Protein, Urine: NEGATIVE
Urine Glucose: NEGATIVE
Urobilinogen, UA: 0.2 (ref 0.0–1.0)
pH: 7 (ref 5.0–8.0)

## 2022-08-03 LAB — CBC WITH DIFFERENTIAL/PLATELET
Basophils Absolute: 0.1 10*3/uL (ref 0.0–0.1)
Basophils Relative: 0.9 % (ref 0.0–3.0)
Eosinophils Absolute: 0.1 10*3/uL (ref 0.0–0.7)
Eosinophils Relative: 0.9 % (ref 0.0–5.0)
HCT: 40.6 % (ref 36.0–46.0)
Hemoglobin: 13.5 g/dL (ref 12.0–15.0)
Lymphocytes Relative: 25.5 % (ref 12.0–46.0)
Lymphs Abs: 2.2 10*3/uL (ref 0.7–4.0)
MCHC: 33.4 g/dL (ref 30.0–36.0)
MCV: 93 fl (ref 78.0–100.0)
Monocytes Absolute: 0.5 10*3/uL (ref 0.1–1.0)
Monocytes Relative: 6 % (ref 3.0–12.0)
Neutro Abs: 5.6 10*3/uL (ref 1.4–7.7)
Neutrophils Relative %: 66.7 % (ref 43.0–77.0)
Platelets: 330 10*3/uL (ref 150.0–400.0)
RBC: 4.36 Mil/uL (ref 3.87–5.11)
RDW: 13.2 % (ref 11.5–15.5)
WBC: 8.4 10*3/uL (ref 4.0–10.5)

## 2022-08-03 LAB — SEDIMENTATION RATE: Sed Rate: 16 mm/hr (ref 0–20)

## 2022-08-03 NOTE — Telephone Encounter (Signed)
Lft pt vm to call ofc to sch CT. thanks 

## 2022-08-03 NOTE — Progress Notes (Unsigned)
Subjective:    Patient ID: Monica Kaiser, female    DOB: 1975-01-22, 47 y.o.   MRN: [id number]  Patient here for  Chief Complaint  Patient presents with   Annual Exam    HPI Here for a physical exam. Has a history of migraine headache. Takes abortive medication prn. Sees Dr Sherryll Burger. Discussed - having migraine - 1-2x/moth. Typical migraine - blurred vision precedes headache. Not taking magnesium. Discussed restarting. Has had persistent intermittent low back pain - had been taking meloxicam and flexeril. Going to PT.  Had L-S spine xray - results pending.  Recently changed to celebrex.  Had f/u with cardiology 07/15/22 - f/u increased heart rate. Recommended zio monitor - 2 weeks. Also recommended echo.    Past Medical History:  Diagnosis Date   Anxiety    Fatigue    Low back pain    Migraine    Miscarriage    Past Surgical History:  Procedure Laterality Date   BREAST CYST EXCISION Right 2003   benign    COLONOSCOPY  2004   Family History  Problem Relation Age of Onset   Lung cancer Father 4   Colon cancer Father 4   Cancer Father        lung and colon cancer age 79   Arthritis Mother    Diabetes Mother    Hyperlipidemia Mother    Hypertension Mother    Breast cancer Neg Hx    Social History   Socioeconomic History   Marital status: Married    Spouse name: Not on file   Number of children: 2   Years of education: Not on file   Highest education level: Not on file  Occupational History   Not on file  Tobacco Use   Smoking status: Never   Smokeless tobacco: Never  Vaping Use   Vaping status: Never Used  Substance and Sexual Activity   Alcohol use: No   Drug use: No   Sexual activity: Yes  Other Topics Concern   Not on file  Social History Narrative   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 [place]   2 kids    Married    No guns   Wears seat belt    Social Determinants of Health   Financial Resource Strain: Not on file  Food  Insecurity: Not on file  Transportation Needs: Not on file  Physical Activity: Not on file  Stress: Not on file  Social Connections: Not on file     Review of Systems     Objective:     BP 110/66   Pulse 71   Temp 98.5 F (36.9 C) (Oral)   Ht 5\' 6"  (1.676 m)   Wt 135 lb 12.8 oz (61.6 kg)   LMP 07/16/2022 (Approximate)   SpO2 98%   BMI 21.92 kg/m  Wt Readings from Last 3 Encounters:  08/03/22 135 lb 12.8 oz (61.6 kg)  07/15/22 135 lb 6 oz (61.4 kg)  07/03/22 136 lb (61.7 kg)    Physical Exam   Outpatient Encounter Medications as of 08/03/2022  Medication Sig   Ascorbic Acid (VITAMIN C PO) Take by mouth.   celecoxib (CELEBREX) 200 MG capsule Take 1 capsule (200 mg total) by mouth daily as needed.   cyclobenzaprine (FLEXERIL) 5 MG tablet Take 1 tablet (5 mg total) by mouth every 8 (eight) hours.   eletriptan (RELPAX) 40 MG tablet Take 40 mg by mouth  as needed for migraine or headache. (can repeat in 1-2 hrs if no response), no more than 80 mg in one day and no more than 160 mg / week.   magnesium oxide (MAG-OX) 400 MG tablet Take 1 tablet (400 mg total) by mouth daily.   MAGNESIUM PO Take by mouth.   Multiple Vitamins-Minerals (ZINC PO) Take by mouth.   norgestimate-ethinyl estradiol (ORTHO-CYCLEN) 0.25-35 MG-MCG tablet Take 1 tablet by mouth daily.   ondansetron (ZOFRAN-ODT) 8 MG disintegrating tablet Take 1 tablet (8 mg total) by mouth every 8 (eight) hours as needed for nausea or vomiting.   Riboflavin (B2 PO) Take by mouth.   tiZANidine (ZANAFLEX) 4 MG tablet Take 1 tablet (4 mg total) by mouth at bedtime as needed for muscle spasms.   VITAMIN D PO Take by mouth.   No facility-administered encounter medications on file as of 08/03/2022.     Lab Results  Component Value Date   WBC 4.7 04/23/2022   HGB 12.3 04/23/2022   HCT 36.7 04/23/2022   PLT 303 04/23/2022   GLUCOSE 94 04/23/2022   CHOL 170 04/23/2022   TRIG 96 04/23/2022   HDL 52 04/23/2022   LDLCALC 99  04/23/2022   ALT 15 04/23/2022   AST 18 04/23/2022   NA 138 04/23/2022   K 4.0 04/23/2022   CL 106 04/23/2022   CREATININE 0.50 04/23/2022   BUN 14 04/23/2022   CO2 25 04/23/2022   TSH 0.989 04/23/2022   HGBA1C 5.5 10/05/2018    No results found.     Assessment & Plan:  Health care maintenance Assessment & Plan: Physical today 08/03/22. Mammogram 09/12/21 - Birads 0.  F/u right breast mammogram 09/17/21 - Birads II.  Schedule f/u screening mammogram. Age 50 - due screening colonoscopy.  PAP -    Annual physical exam -     Basic metabolic panel -     Hepatic function panel -     Lipid panel  Abdominal bloating -     CBC with Differential/Platelet -     CT ABDOMEN PELVIS W CONTRAST; Future  Midline low back pain without sciatica, unspecified chronicity -     Sedimentation rate -     CT ABDOMEN PELVIS W CONTRAST; Future     Dale [place] [facility], MD

## 2022-08-03 NOTE — Assessment & Plan Note (Signed)
Physical today 08/03/22. Mammogram 09/12/21 - Birads 0.  F/u right breast mammogram 09/17/21 - Birads II.  Schedule f/u screening mammogram. Age 47 - due screening colonoscopy.  PAP - gyn.

## 2022-08-04 ENCOUNTER — Other Ambulatory Visit: Payer: Self-pay | Admitting: Internal Medicine

## 2022-08-04 ENCOUNTER — Other Ambulatory Visit: Payer: Self-pay

## 2022-08-04 DIAGNOSIS — M545 Low back pain, unspecified: Secondary | ICD-10-CM

## 2022-08-04 DIAGNOSIS — R319 Hematuria, unspecified: Secondary | ICD-10-CM

## 2022-08-04 DIAGNOSIS — Z1231 Encounter for screening mammogram for malignant neoplasm of breast: Secondary | ICD-10-CM

## 2022-08-05 ENCOUNTER — Encounter: Payer: Self-pay | Admitting: Internal Medicine

## 2022-08-05 ENCOUNTER — Other Ambulatory Visit: Payer: Self-pay | Admitting: Medical

## 2022-08-05 DIAGNOSIS — R079 Chest pain, unspecified: Secondary | ICD-10-CM

## 2022-08-05 NOTE — Assessment & Plan Note (Signed)
Low back pain as outlined.  No radicular symptoms.  She is changing to celebrex.  Tried flexeril.  Waiting for xray results.

## 2022-08-05 NOTE — Assessment & Plan Note (Signed)
Intermittent flares.  Has adjusted diet.  Has seen GI.  Diagnosed with IBS.

## 2022-08-05 NOTE — Assessment & Plan Note (Signed)
History of migraine headaches as outlined.  Magnesium oxide supplement. Follow.

## 2022-08-05 NOTE — Assessment & Plan Note (Signed)
Seeing Dr Martha Clan.  Planning to start celebrex.  Follow.

## 2022-08-07 ENCOUNTER — Encounter: Payer: Commercial Managed Care - PPO | Admitting: Physical Therapy

## 2022-08-07 ENCOUNTER — Ambulatory Visit
Admission: RE | Admit: 2022-08-07 | Discharge: 2022-08-07 | Disposition: A | Discharge status: Home / Self Care | Payer: Commercial Managed Care - PPO | Source: Ambulatory Visit | Attending: Internal Medicine | Admitting: Internal Medicine

## 2022-08-07 ENCOUNTER — Other Ambulatory Visit: Payer: Commercial Managed Care - PPO

## 2022-08-07 DIAGNOSIS — R140 Abdominal distension (gaseous): Secondary | ICD-10-CM | POA: Diagnosis not present

## 2022-08-07 DIAGNOSIS — M545 Low back pain, unspecified: Secondary | ICD-10-CM

## 2022-08-07 MED ORDER — IOHEXOL 300 MG/ML  SOLN
100.0000 mL | Freq: Once | INTRAMUSCULAR | Status: AC | PRN
  Administered 2022-08-07: 80 mL via INTRAVENOUS

## 2022-08-10 ENCOUNTER — Other Ambulatory Visit: Payer: Self-pay | Admitting: Medical

## 2022-08-10 ENCOUNTER — Ambulatory Visit: Payer: Commercial Managed Care - PPO

## 2022-08-10 DIAGNOSIS — R079 Chest pain, unspecified: Secondary | ICD-10-CM

## 2022-08-10 DIAGNOSIS — I340 Nonrheumatic mitral (valve) insufficiency: Secondary | ICD-10-CM

## 2022-08-10 LAB — ECHOCARDIOGRAM COMPLETE
Area-P 1/2: 3.42 cm2
S' Lateral: 3.2 cm

## 2022-08-11 ENCOUNTER — Ambulatory Visit: Payer: Commercial Managed Care - PPO

## 2022-08-11 DIAGNOSIS — I493 Ventricular premature depolarization: Secondary | ICD-10-CM | POA: Diagnosis not present

## 2022-08-11 DIAGNOSIS — I491 Atrial premature depolarization: Secondary | ICD-10-CM

## 2022-08-14 ENCOUNTER — Ambulatory Visit: Payer: Commercial Managed Care - PPO | Admitting: Medical

## 2022-08-14 ENCOUNTER — Other Ambulatory Visit: Payer: Self-pay

## 2022-08-16 ENCOUNTER — Other Ambulatory Visit: Payer: Self-pay

## 2022-08-17 ENCOUNTER — Telehealth: Payer: Self-pay | Admitting: *Deleted

## 2022-08-17 ENCOUNTER — Other Ambulatory Visit: Payer: Commercial Managed Care - PPO

## 2022-08-17 ENCOUNTER — Other Ambulatory Visit: Payer: Self-pay

## 2022-08-17 MED ORDER — NORGESTIMATE-ETH ESTRADIOL 0.25-35 MG-MCG PO TABS
1.0000 | ORAL_TABLET | Freq: Every day | ORAL | 1 refills | Status: DC
  Filled 2022-08-17: qty 28, 28d supply, fill #0
  Filled 2022-09-02: qty 28, 28d supply, fill #1

## 2022-08-17 NOTE — Telephone Encounter (Signed)
Email notification received from [employer] stating that patient's ZIO (XT/ AT) monitor ordered by Cadence Fransico Michael PA-C on is (pending return/ lost/ not activated or no data received, etc.)  It appears as if she may have worn 1 of 2 monitors and may still be wearing 2 of 2.     Serial #: [id number]     Forwarding this notification to Gregor Hams RN as an Lorain Childes and to follow up with the patient if needed.

## 2022-08-17 NOTE — Telephone Encounter (Signed)
Patient states she started wearing monitor last Tuesday on 8/6 after her vacation and will complete on 8/20.

## 2022-08-17 NOTE — Telephone Encounter (Signed)
Left a message for the patient to call back.  

## 2022-08-17 NOTE — Telephone Encounter (Signed)
Patient returned RN's call. 

## 2022-08-31 DIAGNOSIS — I491 Atrial premature depolarization: Secondary | ICD-10-CM | POA: Diagnosis not present

## 2022-08-31 DIAGNOSIS — I493 Ventricular premature depolarization: Secondary | ICD-10-CM | POA: Diagnosis not present

## 2022-09-02 ENCOUNTER — Other Ambulatory Visit: Payer: Self-pay | Admitting: Internal Medicine

## 2022-09-02 ENCOUNTER — Other Ambulatory Visit: Payer: Self-pay

## 2022-09-02 NOTE — Telephone Encounter (Signed)
New pt to me.  In reviewing, it appears that she has been seeing Dr Sherryll Burger.  He follows her for her migraine headaches.  Is he the one that has been refilling/prescribing the medication.  Need to clarify - if he has been prescribing and following her for her headaches.

## 2022-09-09 NOTE — Telephone Encounter (Signed)
Spoke with patient. She was seeing Dr Sherryll Burger for her headaches but has been more than a year. She does plan to follow up with him but does not have one scheduled yet. She did not mean for them to send this refill to her PCP. She does not have any but does not use often.

## 2022-09-14 ENCOUNTER — Other Ambulatory Visit: Payer: Self-pay

## 2022-09-14 ENCOUNTER — Ambulatory Visit
Admission: RE | Admit: 2022-09-14 | Discharge: 2022-09-14 | Disposition: A | Discharge status: Home / Self Care | Payer: Commercial Managed Care - PPO | Source: Ambulatory Visit | Attending: Internal Medicine | Admitting: Internal Medicine

## 2022-09-14 DIAGNOSIS — Z3041 Encounter for surveillance of contraceptive pills: Secondary | ICD-10-CM | POA: Diagnosis not present

## 2022-09-14 DIAGNOSIS — Z1231 Encounter for screening mammogram for malignant neoplasm of breast: Secondary | ICD-10-CM | POA: Insufficient documentation

## 2022-09-14 DIAGNOSIS — R87610 Atypical squamous cells of undetermined significance on cytologic smear of cervix (ASC-US): Secondary | ICD-10-CM | POA: Diagnosis not present

## 2022-09-14 DIAGNOSIS — Z1151 Encounter for screening for human papillomavirus (HPV): Secondary | ICD-10-CM | POA: Diagnosis not present

## 2022-09-14 DIAGNOSIS — Z124 Encounter for screening for malignant neoplasm of cervix: Secondary | ICD-10-CM | POA: Diagnosis not present

## 2022-09-14 MED ORDER — NORGESTIMATE-ETH ESTRADIOL 0.25-35 MG-MCG PO TABS
1.0000 | ORAL_TABLET | Freq: Every day | ORAL | 3 refills | Status: DC
  Filled 2022-09-14 – 2022-10-05 (×2): qty 84, 84d supply, fill #0
  Filled 2022-12-28: qty 84, 84d supply, fill #1
  Filled 2023-03-17: qty 84, 84d supply, fill #2
  Filled 2023-05-26 – 2023-05-31 (×3): qty 84, 84d supply, fill #3

## 2022-09-15 ENCOUNTER — Encounter: Payer: Self-pay | Admitting: Internal Medicine

## 2022-09-17 ENCOUNTER — Encounter: Payer: Self-pay | Admitting: Internal Medicine

## 2022-09-18 ENCOUNTER — Other Ambulatory Visit: Payer: Self-pay

## 2022-09-18 MED ORDER — LINACLOTIDE 290 MCG PO CAPS: 290.0000 ug | ORAL_CAPSULE | Freq: Every day | ORAL | 3 refills | Status: DC

## 2022-09-18 NOTE — Telephone Encounter (Signed)
1.  Give her MiraLAX colonoscopy prep (half prep). 2.  Start Linzess 290 mcg daily. Thanks Dr. Marina Goodell

## 2022-09-21 ENCOUNTER — Other Ambulatory Visit: Payer: Self-pay

## 2022-09-21 MED ORDER — ELETRIPTAN HYDROBROMIDE 20 MG PO TABS
20.0000 mg | ORAL_TABLET | ORAL | 0 refills | Status: DC
  Filled 2022-09-21: qty 6, 3d supply, fill #0
  Filled 2022-09-22: qty 6, 4d supply, fill #0
  Filled 2022-09-22: qty 4, 2d supply, fill #0

## 2022-09-21 MED ORDER — ONDANSETRON 4 MG PO TBDP
4.0000 mg | ORAL_TABLET | Freq: Three times a day (TID) | ORAL | 2 refills | Status: DC | PRN
  Filled 2022-09-21: qty 20, 7d supply, fill #0

## 2022-09-22 ENCOUNTER — Other Ambulatory Visit: Payer: Self-pay

## 2022-09-22 MED ORDER — FLUCONAZOLE 150 MG PO TABS
150.0000 mg | ORAL_TABLET | Freq: Once | ORAL | 0 refills | Status: AC
  Filled 2022-09-22: qty 1, 1d supply, fill #0

## 2022-09-25 ENCOUNTER — Other Ambulatory Visit: Payer: Self-pay

## 2022-09-25 ENCOUNTER — Encounter: Payer: Self-pay | Admitting: Internal Medicine

## 2022-09-25 DIAGNOSIS — N760 Acute vaginitis: Secondary | ICD-10-CM | POA: Diagnosis not present

## 2022-09-25 DIAGNOSIS — R399 Unspecified symptoms and signs involving the genitourinary system: Secondary | ICD-10-CM | POA: Diagnosis not present

## 2022-09-25 DIAGNOSIS — B9689 Other specified bacterial agents as the cause of diseases classified elsewhere: Secondary | ICD-10-CM | POA: Diagnosis not present

## 2022-09-25 MED ORDER — METRONIDAZOLE 500 MG PO TABS
500.0000 mg | ORAL_TABLET | Freq: Two times a day (BID) | ORAL | 0 refills | Status: DC
  Filled 2022-09-25: qty 14, 7d supply, fill #0

## 2022-09-25 MED ORDER — FLUCONAZOLE 150 MG PO TABS
150.0000 mg | ORAL_TABLET | Freq: Once | ORAL | 0 refills | Status: AC
  Filled 2022-09-25: qty 1, 1d supply, fill #0

## 2022-09-28 ENCOUNTER — Other Ambulatory Visit: Payer: Commercial Managed Care - PPO

## 2022-09-28 NOTE — Telephone Encounter (Signed)
Please call and confirm doing ok.  Also, please let her know that blood in the urine can be from infection, inflammation, kidney stone, etc.  When it is persistent, we do have urology evaluate to try and confirm the etiology.  Let me know if any questions or problems.

## 2022-10-02 ENCOUNTER — Other Ambulatory Visit: Payer: Self-pay

## 2022-10-02 DIAGNOSIS — M5416 Radiculopathy, lumbar region: Secondary | ICD-10-CM | POA: Diagnosis not present

## 2022-10-02 MED ORDER — METHYLPREDNISOLONE 4 MG PO TBPK
ORAL_TABLET | ORAL | 0 refills | Status: DC
  Filled 2022-10-02 – 2022-10-30 (×4): qty 21, 6d supply, fill #0

## 2022-10-05 ENCOUNTER — Other Ambulatory Visit: Payer: Self-pay

## 2022-10-05 ENCOUNTER — Ambulatory Visit: Payer: Commercial Managed Care - PPO | Admitting: Urology

## 2022-10-05 ENCOUNTER — Encounter: Payer: Self-pay | Admitting: Urology

## 2022-10-05 DIAGNOSIS — R319 Hematuria, unspecified: Secondary | ICD-10-CM | POA: Diagnosis not present

## 2022-10-05 DIAGNOSIS — R310 Gross hematuria: Secondary | ICD-10-CM | POA: Diagnosis not present

## 2022-10-05 NOTE — Progress Notes (Signed)
I, Maysun Anabel Bene, acting as a scribe for Riki Altes, MD., have documented all relevant documentation on the behalf of Riki Altes, MD, as directed by Riki Altes, MD while in the presence of Riki Altes, MD.  10/05/2022 1:40 PM   Monica Kaiser 11/09/1975 [id number]  Referring provider: Gustavo Lah, CNM [address] [place],  [place] [address]  Chief Complaint  Patient presents with   Hematuria    HPI: Monica Kaiser is a 47 y.o. female referred for evaluation of hematuria.   Gynecology visit 09/25/2022 and urinalysis showed moderate blood on dipstick however no RBCs on microscopy.  On review of urinalysis, she had 2 specimens in 2021, both showing positive blood on dipstick but no significant RBCs on microscopy.  She is asymptomatic and denies bothersome lower urinary tract symptoms, gross hematuria.  No flank, abdominal, or pelvic pain.    PMH: Past Medical History:  Diagnosis Date   Anxiety    Fatigue    Low back pain    Migraine    Miscarriage     Surgical History: Past Surgical History:  Procedure Laterality Date   BREAST CYST EXCISION Right 2003   benign    COLONOSCOPY  2004    Home Medications:  Allergies as of 10/05/2022   No Known Allergies      Medication List        Accurate as of October 05, 2022  1:40 PM. If you have any questions, ask your nurse or doctor.          B2 PO Take by mouth.   celecoxib 200 MG capsule Commonly known as: CeleBREX Take 1 capsule (200 mg total) by mouth daily as needed.   cyclobenzaprine 5 MG tablet Commonly known as: FLEXERIL Take 1 tablet (5 mg total) by mouth every 8 (eight) hours.   eletriptan 40 MG tablet Commonly known as: RELPAX Take 40 mg by mouth as needed for migraine or headache. (can repeat in 1-2 hrs if no response), no more than 80 mg in one day and no more than 160 mg / week.   eletriptan 20 MG tablet Commonly known as: RELPAX Take 1 tablet (20 mg total)  by mouth as directed for Migraine If headache returns, may take a second dose after 2 hours.   linaclotide 290 MCG Caps capsule Commonly known as: LINZESS Take 1 capsule (290 mcg total) by mouth daily before breakfast.   magnesium oxide 400 MG tablet Commonly known as: MAG-OX Take 1 tablet (400 mg total) by mouth daily.   MAGNESIUM PO Take by mouth.   methylPREDNISolone 4 MG Tbpk tablet Commonly known as: Medrol Take as directed on package.   metroNIDAZOLE 500 MG tablet Commonly known as: FLAGYL Take 1 tablet (500 mg total) by mouth 2 (two) times daily for 7 days   ondansetron 8 MG disintegrating tablet Commonly known as: ZOFRAN-ODT Take 1 tablet (8 mg total) by mouth every 8 (eight) hours as needed for nausea or vomiting.   ondansetron 4 MG disintegrating tablet Commonly known as: ZOFRAN-ODT Take 1 tablet (4 mg total) by mouth every 8 (eight) hours as needed for nausea.   tiZANidine 4 MG tablet Commonly known as: Zanaflex Take 1 tablet (4 mg total) by mouth at bedtime as needed for muscle spasms.   VITAMIN C PO Take by mouth.   VITAMIN D PO Take by mouth.   VyLibra 0.25-35 MG-MCG tablet Generic drug: norgestimate-ethinyl estradiol Take 1  tablet by mouth once daily   ZINC PO Take by mouth.        Allergies: No Known Allergies  Family History: Family History  Problem Relation Age of Onset   Lung cancer Father 8   Colon cancer Father 1   Cancer Father        lung and colon cancer age 4   Arthritis Mother    Diabetes Mother    Hyperlipidemia Mother    Hypertension Mother    Breast cancer Neg Hx     Social History:  reports that she has never smoked. She has never used smokeless tobacco. She reports that she does not drink alcohol and does not use drugs.   Physical Exam: LMP 09/09/2022   Constitutional:  Alert, No acute distress. HEENT: [place] AT Respiratory: Normal respiratory effort, no increased work of breathing. Psychiatric: Normal mood and  affect.   Urinalysis Dipstick 1+ blood/microscopy negative    Assessment & Plan:    1. Dipstick positive hematuria Based on American Urological Association practice guidelines asymptomatic microhematuria [facility]) is defined as three or greater red blood cells per high powered field on a properly collected urinary specimen in the absence of an obvious benign cause. A positive dipstick does not define AMH, and evaluation should be based solely on findings from microscopic examination of urinary sediment and not on a dipstick reading.    I have reviewed the above documentation for accuracy and completeness, and I agree with the above.   Riki Altes, MD  [facility] [address] [place], [place] [address] [phone]

## 2022-10-06 ENCOUNTER — Other Ambulatory Visit: Payer: Self-pay

## 2022-10-06 LAB — URINALYSIS, COMPLETE
Bilirubin, UA: NEGATIVE
Glucose, UA: NEGATIVE
Ketones, UA: NEGATIVE
Leukocytes,UA: NEGATIVE
Nitrite, UA: NEGATIVE
Protein,UA: NEGATIVE
Specific Gravity, UA: 1.01 (ref 1.005–1.030)
Urobilinogen, Ur: 0.2 mg/dL (ref 0.2–1.0)
pH, UA: 6 (ref 5.0–7.5)

## 2022-10-06 LAB — MICROSCOPIC EXAMINATION
Bacteria, UA: NONE SEEN
Epithelial Cells (non renal): NONE SEEN /[HPF] (ref 0–10)

## 2022-10-09 DIAGNOSIS — M5416 Radiculopathy, lumbar region: Secondary | ICD-10-CM | POA: Diagnosis not present

## 2022-10-16 ENCOUNTER — Ambulatory Visit: Payer: Commercial Managed Care - PPO | Admitting: Cardiovascular Disease

## 2022-10-16 ENCOUNTER — Other Ambulatory Visit: Payer: Self-pay

## 2022-10-16 DIAGNOSIS — M5416 Radiculopathy, lumbar region: Secondary | ICD-10-CM | POA: Diagnosis not present

## 2022-10-16 MED ORDER — MELOXICAM 15 MG PO TABS
15.0000 mg | ORAL_TABLET | Freq: Every day | ORAL | 0 refills | Status: DC
  Filled 2022-10-16: qty 30, 30d supply, fill #0

## 2022-10-16 MED ORDER — TIZANIDINE HCL 2 MG PO TABS
2.0000 mg | ORAL_TABLET | Freq: Four times a day (QID) | ORAL | 0 refills | Status: DC
  Filled 2022-10-16: qty 30, 8d supply, fill #0

## 2022-10-19 ENCOUNTER — Other Ambulatory Visit: Payer: Self-pay

## 2022-10-19 DIAGNOSIS — N760 Acute vaginitis: Secondary | ICD-10-CM | POA: Diagnosis not present

## 2022-10-22 ENCOUNTER — Inpatient Hospital Stay
Admission: RE | Admit: 2022-10-22 | Discharge: 2022-10-22 | Disposition: A | Discharge status: Home / Self Care | Payer: Self-pay | Source: Ambulatory Visit | Attending: Neurosurgery | Admitting: Neurosurgery

## 2022-10-22 ENCOUNTER — Other Ambulatory Visit: Payer: Self-pay | Admitting: Family Medicine

## 2022-10-22 ENCOUNTER — Telehealth: Payer: Self-pay | Admitting: Neurosurgery

## 2022-10-22 DIAGNOSIS — Z049 Encounter for examination and observation for unspecified reason: Secondary | ICD-10-CM

## 2022-10-22 NOTE — Telephone Encounter (Signed)
Ms. Amerman has requested that I review her MRI scan.  I have reviewed her x-rays as well as MRI scan of the lumbar spine.  I will review this with her.

## 2022-10-29 ENCOUNTER — Other Ambulatory Visit: Payer: Self-pay

## 2022-10-30 ENCOUNTER — Other Ambulatory Visit: Payer: Self-pay

## 2022-11-02 ENCOUNTER — Ambulatory Visit: Payer: Commercial Managed Care - PPO | Admitting: Internal Medicine

## 2022-11-02 ENCOUNTER — Other Ambulatory Visit: Payer: Self-pay

## 2022-11-02 VITALS — BP 118/72 | HR 75 | Temp 98.2°F | Resp 16 | Ht 66.0 in | Wt 135.4 lb

## 2022-11-02 DIAGNOSIS — E538 Deficiency of other specified B group vitamins: Secondary | ICD-10-CM | POA: Insufficient documentation

## 2022-11-02 DIAGNOSIS — G43919 Migraine, unspecified, intractable, without status migrainosus: Secondary | ICD-10-CM

## 2022-11-02 DIAGNOSIS — R5383 Other fatigue: Secondary | ICD-10-CM

## 2022-11-02 DIAGNOSIS — K5909 Other constipation: Secondary | ICD-10-CM

## 2022-11-02 DIAGNOSIS — M545 Low back pain, unspecified: Secondary | ICD-10-CM

## 2022-11-02 DIAGNOSIS — E559 Vitamin D deficiency, unspecified: Secondary | ICD-10-CM | POA: Diagnosis not present

## 2022-11-02 LAB — COMPREHENSIVE METABOLIC PANEL
ALT: 12 U/L (ref 0–35)
AST: 15 U/L (ref 0–37)
Albumin: 4 g/dL (ref 3.5–5.2)
Alkaline Phosphatase: 36 U/L — ABNORMAL LOW (ref 39–117)
BUN: 9 mg/dL (ref 6–23)
CO2: 26 meq/L (ref 19–32)
Calcium: 9.2 mg/dL (ref 8.4–10.5)
Chloride: 106 meq/L (ref 96–112)
Creatinine, Ser: 0.57 mg/dL (ref 0.40–1.20)
GFR: 109.15 mL/min (ref >60.00)
Glucose, Bld: 90 mg/dL (ref 70–99)
Potassium: 4.9 meq/L (ref 3.5–5.1)
Sodium: 137 meq/L (ref 135–145)
Total Bilirubin: 0.4 mg/dL (ref 0.2–1.2)
Total Protein: 7.1 g/dL (ref 6.0–8.3)

## 2022-11-02 LAB — CBC WITH DIFFERENTIAL/PLATELET
Basophils Absolute: 0.1 10*3/uL (ref 0.0–0.1)
Basophils Relative: 1 % (ref 0.0–3.0)
Eosinophils Absolute: 0.2 10*3/uL (ref 0.0–0.7)
Eosinophils Relative: 3.2 % (ref 0.0–5.0)
HCT: 39.4 % (ref 36.0–46.0)
Hemoglobin: 13.1 g/dL (ref 12.0–15.0)
Lymphocytes Relative: 23.5 % (ref 12.0–46.0)
Lymphs Abs: 1.6 10*3/uL (ref 0.7–4.0)
MCHC: 33.3 g/dL (ref 30.0–36.0)
MCV: 93.5 fL (ref 78.0–100.0)
Monocytes Absolute: 0.5 10*3/uL (ref 0.1–1.0)
Monocytes Relative: 7.4 % (ref 3.0–12.0)
Neutro Abs: 4.3 10*3/uL (ref 1.4–7.7)
Neutrophils Relative %: 64.9 % (ref 43.0–77.0)
Platelets: 328 10*3/uL (ref 150.0–400.0)
RBC: 4.21 Mil/uL (ref 3.87–5.11)
RDW: 12.7 % (ref 11.5–15.5)
WBC: 6.6 10*3/uL (ref 4.0–10.5)

## 2022-11-02 LAB — VITAMIN D 25 HYDROXY (VIT D DEFICIENCY, FRACTURES): VITD: 42.7 ng/mL (ref 30.00–100.00)

## 2022-11-02 LAB — VITAMIN B12: Vitamin B-12: 335 pg/mL (ref 211–911)

## 2022-11-02 LAB — TSH: TSH: 0.64 u[IU]/mL (ref 0.35–5.50)

## 2022-11-02 NOTE — Progress Notes (Signed)
Subjective:    Patient ID: Monica Kaiser, female    DOB: 21-Apr-1975, 47 y.o.   MRN: [id number]  Patient here for  Chief Complaint  Patient presents with   Fatigue    HPI Work in appt with concerns regarding fatigue. Reports she has noticed increased fatigue. Discussed.  She is sleeping.  Does feel rested when wakes up.  Her back is limiting her activity.  May be contributing to the fatigue.  Also has had issues with vaginal irritation.  Was recently treated for BV.  Seeing gyn.  Has started taking tumeric, vitamin D. Discussed B12. Last bowel movement 3-4 days ago.  Has appt with GI this week.  Breathing stable.  No increased cough or congestion reported.    Past Medical History:  Diagnosis Date   Anxiety    Fatigue    Low back pain    Migraine    Miscarriage    Past Surgical History:  Procedure Laterality Date   BREAST CYST EXCISION Right 2003   benign    COLONOSCOPY  2004   Family History  Problem Relation Age of Onset   Lung cancer Father 41   Colon cancer Father 79   Cancer Father        lung and colon cancer age 48   Arthritis Mother    Diabetes Mother    Hyperlipidemia Mother    Hypertension Mother    Breast cancer Neg Hx    Social History   Socioeconomic History   Marital status: Married    Spouse name: Not on file   Number of children: 2   Years of education: Not on file   Highest education level: Not on file  Occupational History   Not on file  Tobacco Use   Smoking status: Never   Smokeless tobacco: Never  Vaping Use   Vaping status: Never Used  Substance and Sexual Activity   Alcohol use: No   Drug use: No   Sexual activity: Yes  Other Topics Concern   Not on file  Social History Narrative   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 [place]   2 kids    Married    No guns   Wears seat belt    Social Determinants of Health   Financial Resource Strain: Not on file  Food Insecurity: No Food Insecurity (09/25/2022)    Received from [facility]   Hunger Vital Sign    Worried About Running Out of Food in the Last Year: Never true    Ran Out of Food in the Last Year: Never true  Transportation Needs: No Transportation Needs (09/25/2022)   Received from [facility] - Transportation    In the past 12 months, has lack of transportation kept you from medical appointments or from getting medications?: No    Lack of Transportation (Non-Medical): No  Physical Activity: Not on file  Stress: Not on file  Social Connections: Not on file     Review of Systems  Constitutional:  Positive for fatigue. Negative for appetite change and unexpected weight change.  HENT:  Negative for congestion and sinus pressure.   Respiratory:  Negative for cough, chest tightness and shortness of breath.   Cardiovascular:  Negative for chest pain and palpitations.  Gastrointestinal:  Negative for abdominal pain, diarrhea, nausea and vomiting.  Genitourinary:  Negative for difficulty urinating and dysuria.  Musculoskeletal:  Negative  for joint swelling and myalgias.  Skin:  Negative for color change and rash.  Neurological:  Negative for dizziness and headaches.  Psychiatric/Behavioral:  Negative for agitation and dysphoric mood.        Objective:     BP 118/72   Pulse 75   Temp 98.2 F (36.8 C)   Resp 16   Ht 5\' 6"  (1.676 m)   Wt 135 lb 6.4 oz (61.4 kg)   SpO2 98%   BMI 21.85 kg/m  Wt Readings from Last 3 Encounters:  11/04/22 137 lb (62.1 kg)  11/02/22 135 lb 6.4 oz (61.4 kg)  08/03/22 135 lb 12.8 oz (61.6 kg)    Physical Exam Vitals reviewed.  Constitutional:      General: She is not in acute distress.    Appearance: Normal appearance.  HENT:     Head: Normocephalic and atraumatic.     Right Ear: External ear normal.     Left Ear: External ear normal.  Eyes:     General: No scleral icterus.       Right eye: No discharge.        Left eye: No discharge.      Conjunctiva/sclera: Conjunctivae normal.  Neck:     Thyroid: No thyromegaly.  Cardiovascular:     Rate and Rhythm: Normal rate and regular rhythm.  Pulmonary:     Effort: No respiratory distress.     Breath sounds: Normal breath sounds. No wheezing.  Abdominal:     General: Bowel sounds are normal.     Palpations: Abdomen is soft.     Tenderness: There is no abdominal tenderness.  Musculoskeletal:        General: No swelling or tenderness.     Cervical back: Neck supple. No tenderness.  Lymphadenopathy:     Cervical: No cervical adenopathy.  Skin:    Findings: No erythema or rash.  Neurological:     Mental Status: She is alert.  Psychiatric:        Mood and Affect: Mood normal.        Behavior: Behavior normal.      Outpatient Encounter Medications as of 11/02/2022  Medication Sig   Ascorbic Acid (VITAMIN C PO) Take by mouth.   magnesium oxide (MAG-OX) 400 MG tablet Take 1 tablet (400 mg total) by mouth daily.   MAGNESIUM PO Take by mouth. (Patient not taking: Reported on 11/04/2022)   Multiple Vitamins-Minerals (ZINC PO) Take by mouth.   norgestimate-ethinyl estradiol (ORTHO-CYCLEN) 0.25-35 MG-MCG tablet Take 1 tablet by mouth once daily   ondansetron (ZOFRAN-ODT) 4 MG disintegrating tablet Take 1 tablet (4 mg total) by mouth every 8 (eight) hours as needed for nausea.   Riboflavin (B2 PO) Take by mouth.   VITAMIN D PO Take by mouth.   [DISCONTINUED] celecoxib (CELEBREX) 200 MG capsule Take 1 capsule (200 mg total) by mouth daily as needed.   [DISCONTINUED] cyclobenzaprine (FLEXERIL) 5 MG tablet Take 1 tablet (5 mg total) by mouth every 8 (eight) hours.   [DISCONTINUED] eletriptan (RELPAX) 20 MG tablet Take 1 tablet (20 mg total) by mouth as directed for Migraine If headache returns, may take a second dose after 2 hours.   [DISCONTINUED] eletriptan (RELPAX) 40 MG tablet Take 40 mg by mouth as needed for migraine or headache. (can repeat in 1-2 hrs if no response), no more  than 80 mg in one day and no more than 160 mg / week.   [DISCONTINUED] linaclotide (LINZESS) 290 MCG CAPS  capsule Take 1 capsule (290 mcg total) by mouth daily before breakfast.   [DISCONTINUED] meloxicam (MOBIC) 15 MG tablet Take 1 tablet (15 mg total) by mouth once daily.   [DISCONTINUED] methylPREDNISolone (MEDROL) 4 MG TBPK tablet Take as directed on package.   [DISCONTINUED] metroNIDAZOLE (FLAGYL) 500 MG tablet Take 1 tablet (500 mg total) by mouth 2 (two) times daily for 7 days   [DISCONTINUED] ondansetron (ZOFRAN-ODT) 8 MG disintegrating tablet Take 1 tablet (8 mg total) by mouth every 8 (eight) hours as needed for nausea or vomiting. (Patient not taking: Reported on 11/04/2022)   [DISCONTINUED] tiZANidine (ZANAFLEX) 2 MG tablet Take 1 tablet (2 mg total) by mouth once every 6 (six) hours.   [DISCONTINUED] tiZANidine (ZANAFLEX) 4 MG tablet Take 1 tablet (4 mg total) by mouth at bedtime as needed for muscle spasms.   No facility-administered encounter medications on file as of 11/02/2022.     Lab Results  Component Value Date   WBC 6.6 11/02/2022   HGB 13.1 11/02/2022   HCT 39.4 11/02/2022   PLT 328.0 11/02/2022   GLUCOSE 90 11/02/2022   CHOL 185 08/03/2022   TRIG 144.0 08/03/2022   HDL 54.80 08/03/2022   LDLCALC 101 (H) 08/03/2022   ALT 12 11/02/2022   AST 15 11/02/2022   NA 137 11/02/2022   K 4.9 11/02/2022   CL 106 11/02/2022   CREATININE 0.57 11/02/2022   BUN 9 11/02/2022   CO2 26 11/02/2022   TSH 0.64 11/02/2022   HGBA1C 5.5 10/05/2018    No results found.     Assessment & Plan:  Fatigue, unspecified type Assessment & Plan: Increased fatigue as outlined.  Discussed probably multifactorial. Check cbc, tsh and metabolic panel.  Also check B12 and vitamin d.  Discussed sleep and possible sleep apnea.  Start with above w/up as outlined - checking labs.  Continue exercise.  Follow.   Orders: -     CBC with Differential/Platelet -     Comprehensive metabolic panel -      TSH  Low serum vitamin B12 Assessment & Plan: Check B12 level today.   Orders: -     Vitamin B12  Vitamin D deficiency Assessment & Plan: Check vitamin D level.   Orders: -     VITAMIN D 25 Hydroxy (Vit-D Deficiency, Fractures)  Midline low back pain without sciatica, unspecified chronicity Assessment & Plan: Evaluated [facility] 10/16/22.     Intractable migraine without status migrainosus, unspecified migraine type Assessment & Plan: History of migraine headaches as outlined. Sees neurology.  Magnesium. Follow.     Chronic constipation Assessment & Plan: Persistent issue with constipation.  Have discussed magnesium, benefiber, use of suppositories, etc. Due to see GI this week.       Dale [place], MD

## 2022-11-03 ENCOUNTER — Encounter: Payer: Self-pay | Admitting: Internal Medicine

## 2022-11-03 NOTE — Telephone Encounter (Signed)
Ok for her to take vitamin d 2000 units per day?

## 2022-11-03 NOTE — Telephone Encounter (Signed)
Given she is not taking regularly and given the vitamin D level is in the 40s, would recommend vitamin D3 1000 units per day. Can finish out her 2000 units per day.

## 2022-11-04 ENCOUNTER — Ambulatory Visit: Payer: Commercial Managed Care - PPO | Admitting: Internal Medicine

## 2022-11-04 ENCOUNTER — Encounter: Payer: Self-pay | Admitting: Internal Medicine

## 2022-11-04 ENCOUNTER — Other Ambulatory Visit: Payer: Self-pay

## 2022-11-04 VITALS — BP 104/70 | HR 70 | Ht 66.0 in | Wt 137.0 lb

## 2022-11-04 DIAGNOSIS — K581 Irritable bowel syndrome with constipation: Secondary | ICD-10-CM

## 2022-11-04 DIAGNOSIS — Z800 Family history of malignant neoplasm of digestive organs: Secondary | ICD-10-CM

## 2022-11-04 MED ORDER — TRULANCE 3 MG PO TABS
1.0000 | ORAL_TABLET | Freq: Every day | ORAL | 6 refills | Status: DC
  Filled 2022-11-04 – 2022-11-11 (×6): qty 30, 30d supply, fill #0

## 2022-11-04 NOTE — Progress Notes (Signed)
HISTORY OF PRESENT ILLNESS:  Monica Kaiser is a 47 y.o. female, native of [place] and current short stay nurse at [employer], who presents today regarding problems with chronic constipation.  She was initially seen in the office September 12, 2021 regarding chronic constipation and bloating.  Also, family history of colon cancer in her father around age 28.  She underwent complete colonoscopy in September 2023.  This was normal.  She tried Linzess 145 mcg daily as well as Linzess 290 mcg daily.  This was not effective.  She has tried a number of over-the-counter agents without success.  She did try MiraLAX regularly for a while but found this to be unpleasant.  Currently she describes having a bowel movement about twice per week.  While and after traveling to the [place] she did not have a bowel movement for 2-1/2 weeks.  She saw her in late July.  Comprehensive metabolic panel and CBC were normal.  TSH in April was normal.  She did undergo a contrast-enhanced CT scan of the abdomen and pelvis August 07, 2022.  This was unremarkable.  This appointment made.  Patient states that her problem is similar to the past.  She does wonder about trying Trulance (upon the recommendation of a friend).  REVIEW OF SYSTEMS:  All non-GI ROS negative unless otherwise stated in the HPI except for headaches and back pain  Past Medical History:  Diagnosis Date   Anxiety    Fatigue    Low back pain    Migraine    Miscarriage     Past Surgical History:  Procedure Laterality Date   BREAST CYST EXCISION Right 2003   benign    COLONOSCOPY  2004    Social History BENITO KUEKER  reports that she has never smoked. She has never used smokeless tobacco. She reports that she does not drink alcohol and does not use drugs.  family history includes Arthritis in her mother; Cancer in her father; Colon cancer (age of onset: 69) in her father; Diabetes in her mother; Hyperlipidemia in her  mother; Hypertension in her mother; Lung cancer (age of onset: 32) in her father.  No Known Allergies     PHYSICAL EXAMINATION: Vital signs: BP 104/70   Pulse 70   Ht 5\' 6"  (1.676 m)   Wt 137 lb (62.1 kg)   BMI 22.11 kg/m   Constitutional: generally well-appearing, no acute distress Psychiatric: alert and oriented x3, cooperative Eyes: extraocular movements intact, anicteric, conjunctiva pink Mouth: oral pharynx moist, no lesions Neck: supple no lymphadenopathy Cardiovascular: heart regular rate and rhythm, no murmur Lungs: clear to auscultation bilaterally Abdomen: soft, nontender, nondistended, no obvious ascites, no peritoneal signs, normal bowel sounds, no organomegaly Rectal: Omitted Extremities: no clubbing cyanosis, or lower extremity edema bilaterally Skin: no lesions on visible extremities Neuro: No focal deficits.  Cranial nerves intact  ASSESSMENT:  1.  Constipation predominant irritable bowel syndrome.  She has tried a number of agents without satisfactory results 2.  Family history of colon cancer in father around age 47 3.  Normal colonoscopy September 2023   PLAN:  1.  Trulance 3 mg daily.  Samples provided.  Medication prescribed.  Medication side effects reviewed. 2.  Could add in daily dose of MiraLAX if needed 3.  If Trulance ineffective, could consider Amitiza 8 mcg p.o. twice daily 4.  Surveillance colonoscopy around September 2028 5.  Ongoing general medical care with Dr. Lorin Picket 6.  GI follow-up as needed

## 2022-11-04 NOTE — Patient Instructions (Signed)
We have sent the following medications to your pharmacy for you to pick up at your convenience:  Trulance.  _______________________________________________________  If your blood pressure at your visit was 140/90 or greater, please contact your primary care physician to follow up on this.  _______________________________________________________  If you are age 46 or older, your body mass index should be between 23-30. Your Body mass index is 22.11 kg/m. If this is out of the aforementioned range listed, please consider follow up with your Primary Care Provider.  If you are age 54 or younger, your body mass index should be between 19-25. Your Body mass index is 22.11 kg/m. If this is out of the aformentioned range listed, please consider follow up with your Primary Care Provider.   ________________________________________________________  The [facility] providers would like to encourage you to use [facility] to communicate with providers for non-urgent requests or questions.  Due to long hold times on the telephone, sending your provider a message by [facility] may be a faster and more efficient way to get a response.  Please allow 48 business hours for a response.  Please remember that this is for non-urgent requests.  _______________________________________________________

## 2022-11-05 ENCOUNTER — Other Ambulatory Visit: Payer: Self-pay

## 2022-11-06 ENCOUNTER — Other Ambulatory Visit: Payer: Self-pay

## 2022-11-06 ENCOUNTER — Ambulatory Visit: Payer: Commercial Managed Care - PPO | Admitting: Internal Medicine

## 2022-11-08 ENCOUNTER — Encounter: Payer: Self-pay | Admitting: Internal Medicine

## 2022-11-08 NOTE — Assessment & Plan Note (Signed)
Check B12 level today.  

## 2022-11-08 NOTE — Assessment & Plan Note (Signed)
Persistent issue with constipation.  Have discussed magnesium, benefiber, use of suppositories, etc. Due to see GI this week.

## 2022-11-08 NOTE — Assessment & Plan Note (Signed)
Evaluated Emerge 10/16/22.

## 2022-11-08 NOTE — Assessment & Plan Note (Signed)
Check vitamin D level 

## 2022-11-08 NOTE — Assessment & Plan Note (Signed)
Increased fatigue as outlined.  Discussed probably multifactorial. Check cbc, tsh and metabolic panel.  Also check B12 and vitamin d.  Discussed sleep and possible sleep apnea.  Start with above w/up as outlined - checking labs.  Continue exercise.  Follow.

## 2022-11-08 NOTE — Assessment & Plan Note (Signed)
History of migraine headaches as outlined. Sees neurology.  Magnesium. Follow.

## 2022-11-09 ENCOUNTER — Other Ambulatory Visit: Payer: Self-pay

## 2022-11-10 ENCOUNTER — Other Ambulatory Visit: Payer: Self-pay

## 2022-11-10 ENCOUNTER — Other Ambulatory Visit (HOSPITAL_COMMUNITY): Payer: Self-pay

## 2022-11-10 ENCOUNTER — Telehealth: Payer: Self-pay

## 2022-11-10 NOTE — Telephone Encounter (Signed)
Pharmacy Patient Advocate Encounter   Received notification from Patient Pharmacy that prior authorization for Trulance 3MG  tablets is required/requested.   Insurance verification completed.   The patient is insured through [facility] .   Per test claim: PA required; PA submitted to above mentioned insurance via CoverMyMeds Key/confirmation #/EOC [id number] Status is pending

## 2022-11-11 ENCOUNTER — Other Ambulatory Visit: Payer: Self-pay

## 2022-11-11 NOTE — Progress Notes (Addendum)
Per Dr. Lonna Cobb this patient should NOT been charged for services. It should have been a no charge visit and copay needs to be refunded to patient.   Darol Destine, CMA

## 2022-11-12 ENCOUNTER — Other Ambulatory Visit (HOSPITAL_COMMUNITY): Payer: Self-pay

## 2022-11-13 ENCOUNTER — Other Ambulatory Visit (HOSPITAL_COMMUNITY): Payer: Self-pay

## 2022-11-13 NOTE — Telephone Encounter (Signed)
Pharmacy Patient Advocate Encounter  Received notification from [facility] that Prior Authorization for Trulance 3MG  tablets has been APPROVED from 11/11/2022 to 11/10/2023   PA #/Case ID/Reference #: [id number]

## 2022-11-17 ENCOUNTER — Ambulatory Visit
Payer: Commercial Managed Care - PPO | Attending: Student in an Organized Health Care Education/Training Program | Admitting: Student in an Organized Health Care Education/Training Program

## 2022-11-17 ENCOUNTER — Other Ambulatory Visit: Payer: Self-pay

## 2022-11-17 ENCOUNTER — Encounter: Payer: Self-pay | Admitting: Student in an Organized Health Care Education/Training Program

## 2022-11-17 VITALS — BP 124/77 | HR 75 | Temp 97.5°F | Ht 66.0 in | Wt 136.0 lb

## 2022-11-17 DIAGNOSIS — M47816 Spondylosis without myelopathy or radiculopathy, lumbar region: Secondary | ICD-10-CM | POA: Insufficient documentation

## 2022-11-17 DIAGNOSIS — M7918 Myalgia, other site: Secondary | ICD-10-CM | POA: Insufficient documentation

## 2022-11-17 DIAGNOSIS — M545 Low back pain, unspecified: Secondary | ICD-10-CM | POA: Insufficient documentation

## 2022-11-17 DIAGNOSIS — G8929 Other chronic pain: Secondary | ICD-10-CM | POA: Insufficient documentation

## 2022-11-17 DIAGNOSIS — M5136 Other intervertebral disc degeneration, lumbar region with discogenic back pain only: Secondary | ICD-10-CM | POA: Insufficient documentation

## 2022-11-17 MED ORDER — DICLOFENAC SODIUM 50 MG PO TBEC
50.0000 mg | DELAYED_RELEASE_TABLET | Freq: Two times a day (BID) | ORAL | 0 refills | Status: DC
  Filled 2022-11-17: qty 40, 20d supply, fill #0

## 2022-11-17 NOTE — Progress Notes (Signed)
Safety precautions to be maintained throughout the outpatient stay will include: orient to surroundings, keep bed in low position, maintain call bell within reach at all times, provide assistance with transfer out of bed and ambulation.  

## 2022-11-17 NOTE — Progress Notes (Signed)
Patient: Monica Kaiser  Service Category: E/M  Provider: Edward Jolly, MD  DOB: 07-10-1975  DOS: 11/17/2022  Referring Provider: Venetia Night, MD  MRN: [id number]  Setting: Ambulatory outpatient  PCP: Dale Laurel, MD  Type: New Patient  Specialty: Interventional Pain Management    Location: Office  Delivery: Face-to-face     Primary Reason(s) for Visit: Encounter for initial evaluation of one or more chronic problems (new to examiner) potentially causing chronic pain, and posing a threat to normal musculoskeletal function. (Level of risk: High) CC: Back Pain (lower)  HPI  Monica Kaiser is a 47 y.o. year old, female patient, who comes for the first time to our practice referred by Venetia Night, MD for our initial evaluation of her chronic pain. She has Anxiety; Benign cyst of right breast; Headache; Intractable migraine without status migrainosus; Fatigue; Low back pain; Tingling of right upper extremity; Annual physical exam; Sinus tachycardia; PAC (premature atrial contraction); PVC's (premature ventricular contractions); Chronic left shoulder pain; Palpitations; Irritable bowel syndrome with constipation; Chronic constipation; Breast mass, right; Health care maintenance; Abdominal bloating; Low serum vitamin B12; Vitamin D deficiency; Degeneration of intervertebral disc of lumbar region with discogenic back pain; and Lumbar facet arthropathy on their problem list. Today she comes in for evaluation of her Back Pain (lower)  Pain Assessment: Location: Lower Back Radiating: denies Onset: More than a month ago Duration: Chronic pain Quality: Aching, Dull Severity: 6 /10 (subjective, self-reported pain score)  Effect on ADL: limits ADLS Timing: Constant Modifying factors: meds, heat, TENS BP: 124/77  HR: 75  Onset and Duration: Present longer than 3 months Cause of pain: Unknown Severity: No change since onset, NAS-11 at its worse: 10/10, NAS-11 at its best: 5/10, NAS-11 now:  6/10, and NAS-11 on the average: 6/10 Timing: Not influenced by the time of the day Aggravating Factors: Bending, Climbing, Lifiting, Motion, Prolonged sitting, and Twisting Alleviating Factors: Cold packs, Hot packs, Medications, TENS, and Walking Associated Problems: Constipation and Pain that wakes patient up Quality of Pain: Aching, Annoying, Constant, Deep, Disabling, Dull, Exhausting, Horrible, and Uncomfortable Previous Examinations or Tests: MRI scan and X-rays Previous Treatments: Physical Therapy and TENS  Monica Kaiser is being evaluated for possible interventional pain management therapies for the treatment of her chronic pain.   Patient is a pleasant 47 year old female who presents with a chief complaint of axial low back pain in her L4-L5 distribution that radiates in a bandlike pattern laterally.  She denies any pain radiation into her hips, buttocks or legs.  She has tried various conservative treatments including NSAIDs, acetaminophen, massage, TENS unit, heat/cold therapy with limited response.  She is also had dry needling in the past limited response.  She has seen neurosurgery at [facility].  Lumbar MRI was completed, results of which are below.  She does have some Modic changes at L4-L5 with mild degenerative changes.  Lab work is within normal limits.  Meds   Current Outpatient Medications:    Ascorbic Acid (VITAMIN C PO), Take by mouth., Disp: , Rfl:    diclofenac (VOLTAREN) 50 MG EC tablet, Take 1 tablet (50 mg total) by mouth 2 (two) times daily for 20 days., Disp: 40 tablet, Rfl: 0   norgestimate-ethinyl estradiol (ORTHO-CYCLEN) 0.25-35 MG-MCG tablet, Take 1 tablet by mouth once daily, Disp: 84 tablet, Rfl: 3   ondansetron (ZOFRAN-ODT) 4 MG disintegrating tablet, Take 1 tablet (4 mg total) by mouth every 8 (eight) hours as needed for nausea., Disp: 20 tablet, Rfl: 2  Riboflavin (B2 PO), Take by mouth., Disp: , Rfl:    Turmeric (QC TUMERIC COMPLEX) 500 MG CAPS, Take  500 mg by mouth daily., Disp: , Rfl:    VITAMIN D PO, Take by mouth., Disp: , Rfl:    magnesium oxide (MAG-OX) 400 MG tablet, Take 1 tablet (400 mg total) by mouth daily. (Patient not taking: Reported on 11/17/2022), Disp: 30 tablet, Rfl: 2   MAGNESIUM PO, Take by mouth. (Patient not taking: Reported on 11/04/2022), Disp: , Rfl:    Multiple Vitamins-Minerals (ZINC PO), Take by mouth. (Patient not taking: Reported on 11/17/2022), Disp: , Rfl:    Plecanatide (TRULANCE) 3 MG TABS, Take 1 tablet (3 mg total) by mouth daily. (Patient not taking: Reported on 11/17/2022), Disp: 30 tablet, Rfl: 6  Imaging Review  LUMBAR MRI  T12-L1: No spinal canal or foraminal stenosis, facet joints unremarkable L1-L2: Same L2-L3: Same L3-L4: Bilobed protrusions, mild by foraminal stenosis.  No canal stenosis, descending L4 nerve root contact no compression. L4-L5: Disc bulge with left foraminal protrusion, descending L5 nerve root contact without compression, left greater than right foraminal stenosis, no canal stenosis. Mild facet changes L5-S1: Shallow central protrusion, no canal or foraminal stenosis.  Vertebral body heights are maintained and plates are intact Modic fibrovascular edema at L4-L5, L3 inferior endplate Schmorl's node with edema.  DG Lumbar Spine 2-3 Views  Narrative CLINICAL DATA:  Low back pain.  EXAM: LUMBAR SPINE - 2-3 VIEW  COMPARISON:  11/26/2017  FINDINGS: There is no evidence of lumbar spine fracture. Alignment is normal. Intervertebral disc spaces are maintained.  IMPRESSION: Negative.   Electronically Signed By: Kennith Center M.D. On: 08/03/2022 12:17  Lumbar DG (Complete) 4+V: Results for orders placed in visit on 11/26/17  DG Lumbar Spine Complete  Narrative CLINICAL DATA:  Lumbago  EXAM: LUMBAR SPINE - COMPLETE 4+ VIEW  COMPARISON:  None.  FINDINGS: Frontal, lateral, spot lumbosacral lateral, and bilateral oblique views were obtained. There are 5  non-rib-bearing lumbar type vertebral bodies. There is no fracture or spondylolisthesis. The disc spaces appear normal. There is no appreciable facet arthropathy.  IMPRESSION: No fracture or spondylolisthesis.  No evident arthropathy.   Electronically Signed By: Bretta Bang III M.D. On: 11/26/2017 13:18   Complexity Note: Imaging results reviewed.                         ROS  Cardiovascular: No reported cardiovascular signs or symptoms such as High blood pressure, coronary artery disease, abnormal heart rate or rhythm, heart attack, blood thinner therapy or heart weakness and/or failure Pulmonary or Respiratory: No reported pulmonary signs or symptoms such as wheezing and difficulty taking a deep full breath (Asthma), difficulty blowing air out (Emphysema), coughing up mucus (Bronchitis), persistent dry cough, or temporary stoppage of breathing during sleep Neurological: No reported neurological signs or symptoms such as seizures, abnormal skin sensations, urinary and/or fecal incontinence, being born with an abnormal open spine and/or a tethered spinal cord Psychological-Psychiatric: No reported psychological or psychiatric signs or symptoms such as difficulty sleeping, anxiety, depression, delusions or hallucinations (schizophrenial), mood swings (bipolar disorders) or suicidal ideations or attempts Gastrointestinal: Alternating episodes iof diarrhea and constipation (IBS-Irritable bowe syndrome) and Irregular, infrequent bowel movements (Constipation) Genitourinary: No reported renal or genitourinary signs or symptoms such as difficulty voiding or producing urine, peeing blood, non-functioning kidney, kidney stones, difficulty emptying the bladder, difficulty controlling the flow of urine, or chronic kidney disease Hematological: No reported hematological  signs or symptoms such as prolonged bleeding, low or poor functioning platelets, bruising or bleeding easily, hereditary bleeding  problems, low energy levels due to low hemoglobin or being anemic Endocrine: No reported endocrine signs or symptoms such as high or low blood sugar, rapid heart rate due to high thyroid levels, obesity or weight gain due to slow thyroid or thyroid disease Rheumatologic: No reported rheumatological signs and symptoms such as fatigue, joint pain, tenderness, swelling, redness, heat, stiffness, decreased range of motion, with or without associated rash Musculoskeletal: Negative for myasthenia gravis, muscular dystrophy, multiple sclerosis or malignant hyperthermia Work History: Working full time  Allergies  Monica Kaiser has No Known Allergies.  Laboratory Chemistry Profile   Renal Lab Results  Component Value Date   BUN 9 11/02/2022   CREATININE 0.57 11/02/2022   GFR 109.15 11/02/2022   GFRAA >60 06/24/2018   GFRNONAA >60 04/23/2022   SPECGRAV 1.010 10/05/2022   PHUR 6.0 10/05/2022   PROTEINUR Negative 10/05/2022     Electrolytes Lab Results  Component Value Date   NA 137 11/02/2022   K 4.9 11/02/2022   CL 106 11/02/2022   CALCIUM 9.2 11/02/2022     Hepatic Lab Results  Component Value Date   AST 15 11/02/2022   ALT 12 11/02/2022   ALBUMIN 4.0 11/02/2022   ALKPHOS 36 (L) 11/02/2022     ID Lab Results  Component Value Date   PREGTESTUR NEGATIVE 06/24/2018     Bone Lab Results  Component Value Date   VD25OH 42.70 11/02/2022     Endocrine Lab Results  Component Value Date   GLUCOSE 90 11/02/2022   GLUCOSEU Negative 10/05/2022   HGBA1C 5.5 10/05/2018   TSH 0.64 11/02/2022   FREET4 0.71 10/13/2019     Neuropathy Lab Results  Component Value Date   VITAMINB12 335 11/02/2022   HGBA1C 5.5 10/05/2018     CNS No results found for: "COLORCSF", "APPEARCSF", "RBCCOUNTCSF", "WBCCSF", "POLYSCSF", "LYMPHSCSF", "EOSCSF", "PROTEINCSF", "GLUCCSF", "JCVIRUS", "CSFOLI", "IGGCSF", "LABACHR", "ACETBL"   Inflammation (CRP: Acute  ESR: Chronic) Lab Results  Component  Value Date   ESRSEDRATE 16 08/03/2022     Rheumatology No results found for: "RF", "Marsella", "LABURIC", "URICUR", "LYMEIGGIGMAB", "LYMEABIGMQN", "HLAB27"   Coagulation Lab Results  Component Value Date   PLT 328.0 11/02/2022   DDIMER <0.19 11/16/2019     Cardiovascular Lab Results  Component Value Date   HGB 13.1 11/02/2022   HCT 39.4 11/02/2022     Screening Lab Results  Component Value Date   PREGTESTUR NEGATIVE 06/24/2018     Cancer No results found for: "CEA", "CA125", "LABCA2"   Allergens No results found for: "ALMOND", "APPLE", "ASPARAGUS", "AVOCADO", "BANANA", "BARLEY", "BASIL", "BAYLEAF", "GREENBEAN", "LIMABEAN", "WHITEBEAN", "BEEFIGE", "REDBEET", "BLUEBERRY", "BROCCOLI", "CABBAGE", "MELON", "CARROT", "CASEIN", "CASHEWNUT", "CAULIFLOWER", "CELERY"     Note: Lab results reviewed.  PFSH  Drug: Monica Kaiser  reports no history of drug use. Alcohol:  reports no history of alcohol use. Tobacco:  reports that she has never smoked. She has never used smokeless tobacco. Medical:  has a past medical history of Anxiety, Fatigue, Low back pain, Migraine, and Miscarriage. Family: family history includes Arthritis in her mother; Cancer in her father; Colon cancer (age of onset: 35) in her father; Diabetes in her mother; Hyperlipidemia in her mother; Hypertension in her mother; Lung cancer (age of onset: 58) in her father.  Past Surgical History:  Procedure Laterality Date   BREAST CYST EXCISION Right 2003   benign    COLONOSCOPY  2004   Active Ambulatory Problems    Diagnosis Date Noted   Anxiety 08/24/2016   Benign cyst of right breast 08/24/2016   Headache 03/15/2013   Intractable migraine without status migrainosus 11/26/2017   Fatigue 11/26/2017   Low back pain 11/26/2017   Tingling of right upper extremity 10/05/2018   Annual physical exam 10/05/2018   Sinus tachycardia 11/20/2019   PAC (premature atrial contraction) 11/20/2019   PVC's (premature ventricular  contractions) 11/20/2019   Chronic left shoulder pain 11/20/2019   Palpitations 11/20/2019   Irritable bowel syndrome with constipation 07/09/2020   Chronic constipation 07/09/2020   Breast mass, right 09/14/2021   Health care maintenance 08/03/2022   Abdominal bloating 08/03/2022   Low serum vitamin B12 11/02/2022   Vitamin D deficiency 11/02/2022   Degeneration of intervertebral disc of lumbar region with discogenic back pain 11/17/2022   Lumbar facet arthropathy 11/17/2022   Resolved Ambulatory Problems    Diagnosis Date Noted   Leukocytosis 10/05/2018   Past Medical History:  Diagnosis Date   Migraine    Miscarriage    Constitutional Exam  General appearance: Well nourished, well developed, and well hydrated. In no apparent acute distress Vitals:   11/17/22 1101  BP: 124/77  Pulse: 75  Temp: (!) 97.5 F (36.4 C)  SpO2: 100%  Weight: 136 lb (61.7 kg)  Height: 5\' 6"  (1.676 m)   BMI Assessment: Estimated body mass index is 21.95 kg/m as calculated from the following:   Height as of this encounter: 5\' 6"  (1.676 m).   Weight as of this encounter: 136 lb (61.7 kg).  BMI interpretation table: BMI level Category Range association with higher incidence of chronic pain  <18 kg/m2 Underweight   18.5-24.9 kg/m2 Ideal body weight   25-29.9 kg/m2 Overweight Increased incidence by 20%  30-34.9 kg/m2 Obese (Class I) Increased incidence by 68%  35-39.9 kg/m2 Severe obesity (Class II) Increased incidence by 136%  >40 kg/m2 Extreme obesity (Class III) Increased incidence by 254%   Patient's current BMI Ideal Body weight  Body mass index is 21.95 kg/m. Ideal body weight: 59.3 kg (130 lb 11.7 oz) Adjusted ideal body weight: 60.3 kg (132 lb 13.4 oz)   BMI Readings from Last 4 Encounters:  11/17/22 21.95 kg/m  11/04/22 22.11 kg/m  11/02/22 21.85 kg/m  08/03/22 21.92 kg/m   Wt Readings from Last 4 Encounters:  11/17/22 136 lb (61.7 kg)  11/04/22 137 lb (62.1 kg)   11/02/22 135 lb 6.4 oz (61.4 kg)  08/03/22 135 lb 12.8 oz (61.6 kg)    Psych/Mental status: Alert, oriented x 3 (person, place, & time)       Eyes: PERLA Respiratory: No evidence of acute respiratory distress  Thoracic Spine Area Exam  Skin & Axial Inspection: No masses, redness, or swelling Alignment: Symmetrical Functional ROM: Unrestricted ROM Stability: No instability detected Muscle Tone/Kaiser: Functionally intact. No obvious neuro-muscular anomalies detected. Sensory (Neurological): Unimpaired Muscle Kaiser & Tone: No palpable anomalies Gait & Posture Assessment  Ambulation: Unassisted Gait: Relatively normal for age and body habitus Posture: WNL  Lower Extremity Exam    Side: Right lower extremity  Side: Left lower extremity  Stability: No instability observed          Stability: No instability observed          Skin & Extremity Inspection: Skin color, temperature, and hair growth are WNL. No peripheral edema or cyanosis. No masses, redness, swelling, asymmetry, or associated skin lesions. No contractures.  Skin &  Extremity Inspection: Skin color, temperature, and hair growth are WNL. No peripheral edema or cyanosis. No masses, redness, swelling, asymmetry, or associated skin lesions. No contractures.  Functional ROM: Unrestricted ROM                  Functional ROM: Unrestricted ROM                  Muscle Tone/Kaiser: Functionally intact. No obvious neuro-muscular anomalies detected.  Muscle Tone/Kaiser: Functionally intact. No obvious neuro-muscular anomalies detected.  Sensory (Neurological): Unimpaired        Sensory (Neurological): Unimpaired        DTR: Patellar: deferred today Achilles: deferred today Plantar: deferred today  DTR: Patellar: deferred today Achilles: deferred today Plantar: deferred today  Palpation: No palpable anomalies  Palpation: No palpable anomalies    Assessment  Primary Diagnosis & Pertinent Problem List: The primary encounter  diagnosis was Lumbar facet arthropathy. Diagnoses of Chronic bilateral low back pain without sciatica, Degeneration of intervertebral disc of lumbar region with discogenic back pain, and Myofascial pain syndrome of lumbar spine were also pertinent to this visit.  Visit Diagnosis (New problems to examiner): 1. Lumbar facet arthropathy   2. Chronic bilateral low back pain without sciatica   3. Degeneration of intervertebral disc of lumbar region with discogenic back pain   4. Myofascial pain syndrome of lumbar spine    Plan of Care (Initial workup plan)  Unclear etiology for patient's persistent low back pain.  Likely musculoskeletal.  No trauma to low back, lumbar MRI markable, has tried various conservative treatments.  Is not obese.  Not a smoker.  Has done physical therapy in the past.  We discussed lumbar trigger point injections.  Future considerations could also include diagnostic lumbar medial branch nerve blocks.  I will also recommend trial of diclofenac as below.  She can also apply Voltaren cream to her low back.  Procedure Orders         TRIGGER POINT INJECTION     Pharmacotherapy (current): Medications ordered:  Meds ordered this encounter  Medications   diclofenac (VOLTAREN) 50 MG EC tablet    Sig: Take 1 tablet (50 mg total) by mouth 2 (two) times daily for 20 days.    Dispense:  40 tablet    Refill:  0   Medications administered during this visit: Monica Kaiser had no medications administered during this visit.   Interventional management options: Monica Kaiser was informed that there is no guarantee that she would be a candidate for interventional therapies. The decision will be based on the results of diagnostic studies, as well as Monica Kaiser's risk profile.  Procedure(s) under consideration:  Diagnostic lumbar facet medial branch nerve blocks    Provider-requested follow-up: Return in about 6 days (around 11/23/2022) for Lumbar TPI (schedule after 1 pm), in  clinic [facility].  Future Appointments  Date Time Provider Department Center  11/23/2022  1:00 PM Edward Jolly, MD [facility] None  02/01/2023  7:00 AM Dale Denison, MD [facility]    Duration of encounter: .  Total time on encounter, as per AMA guidelines included both the face-to-face and non-face-to-face time personally spent by the physician and/or other qualified health care professional(s) on the day of the encounter (includes time in activities that require the physician or other qualified health care professional and does not include time in activities normally performed by clinical staff). Physician's time may include the following activities when performed: Preparing to see the patient (e.g.,  pre-charting review of records, searching for previously ordered imaging, lab work, and nerve conduction tests) Review of prior analgesic pharmacotherapies. Reviewing PMP Interpreting ordered tests (e.g., lab work, imaging, nerve conduction tests) Performing post-procedure evaluations, including interpretation of diagnostic procedures btaining and/or reviewing separately obtained history Performing a medically appropriate examination and/or evaluation Counseling and educating the patient/family/caregiver Ordering medications, tests, or procedures Referring and communicating with other health care professionals (when not separately reported) Documenting clinical information in the electronic or other health record Independently interpreting results (not separately reported) and communicating results to the patient/ family/caregiver Care coordination (not separately reported)  Note by: Edward Jolly, MD (TTS technology used. I apologize for any typographical errors that were not detected and corrected.) Date: 11/17/2022; Time: 11:58 AM

## 2022-11-17 NOTE — Patient Instructions (Addendum)
Try voltaren cream/gel OTC  ______________________________________________________________________    Procedure instructions  Stop blood-thinners  Do not eat or drink fluids (other than water) for 6 hours before your procedure  No water for 2 hours before your procedure  Take your blood pressure medicine with a sip of water  Arrive 30 minutes before your appointment  If sedation is planned, bring suitable driver. Pennie Banter, Benedetto Goad, & public transportation are NOT APPROVED)  Carefully read the "Preparing for your procedure" detailed instructions  If you have questions call us at [phone]  ______________________________________________________________________

## 2022-11-18 ENCOUNTER — Encounter: Payer: Self-pay | Admitting: Internal Medicine

## 2022-11-18 NOTE — Telephone Encounter (Signed)
If Trulance did not work to help constipation and abdominal discomfort, then stop. My nurse can prescribe you Amitiza 8 mcg twice daily, if you would like. Otherwise, I recommend MiraLAX daily.  I know you found this unpleasant, but there are only so many options. Best, Dr.  Marina Goodell

## 2022-11-19 ENCOUNTER — Other Ambulatory Visit: Payer: Self-pay

## 2022-11-23 ENCOUNTER — Encounter: Payer: Self-pay | Admitting: Student in an Organized Health Care Education/Training Program

## 2022-11-23 ENCOUNTER — Other Ambulatory Visit: Payer: Self-pay

## 2022-11-23 ENCOUNTER — Ambulatory Visit
Payer: Commercial Managed Care - PPO | Attending: Student in an Organized Health Care Education/Training Program | Admitting: Student in an Organized Health Care Education/Training Program

## 2022-11-23 VITALS — BP 107/74 | HR 77 | Temp 97.5°F | Resp 15 | Ht 66.0 in | Wt 136.0 lb

## 2022-11-23 DIAGNOSIS — M5136 Other intervertebral disc degeneration, lumbar region with discogenic back pain only: Secondary | ICD-10-CM | POA: Diagnosis not present

## 2022-11-23 DIAGNOSIS — M7918 Myalgia, other site: Secondary | ICD-10-CM | POA: Diagnosis not present

## 2022-11-23 DIAGNOSIS — M47816 Spondylosis without myelopathy or radiculopathy, lumbar region: Secondary | ICD-10-CM | POA: Diagnosis not present

## 2022-11-23 MED ORDER — DEXAMETHASONE SODIUM PHOSPHATE 10 MG/ML IJ SOLN
10.0000 mg | Freq: Once | INTRAMUSCULAR | Status: AC
  Administered 2022-11-23: 10 mg

## 2022-11-23 MED ORDER — ROPIVACAINE HCL 2 MG/ML IJ SOLN
INTRAMUSCULAR | Status: AC
Start: 2022-11-23 — End: ?
  Filled 2022-11-23: qty 20

## 2022-11-23 MED ORDER — DEXAMETHASONE SODIUM PHOSPHATE 10 MG/ML IJ SOLN
INTRAMUSCULAR | Status: AC
  Filled 2022-11-23: qty 1

## 2022-11-23 MED ORDER — DICLOFENAC SODIUM 1 % EX GEL
4.0000 g | Freq: Four times a day (QID) | CUTANEOUS | 99 refills | Status: AC | PRN
  Filled 2022-11-23: qty 350, fill #0
  Filled 2022-11-24: qty 100, 30d supply, fill #0
  Filled 2022-11-24: qty 100, 15d supply, fill #0

## 2022-11-23 MED ORDER — ROPIVACAINE HCL 2 MG/ML IJ SOLN
9.0000 mL | Freq: Once | INTRAMUSCULAR | Status: AC
  Administered 2022-11-23: 9 mL via PERINEURAL

## 2022-11-23 NOTE — Patient Instructions (Signed)

## 2022-11-23 NOTE — Progress Notes (Signed)
Safety precautions to be maintained throughout the outpatient stay will include: orient to surroundings, keep bed in low position, maintain call bell within reach at all times, provide assistance with transfer out of bed and ambulation.  

## 2022-11-23 NOTE — Progress Notes (Signed)
PROVIDER NOTE: Interpretation of information contained herein should be left to medically-trained personnel. Specific patient instructions are provided elsewhere under "Patient Instructions" section of medical record. This document was created in part using STT-dictation technology, any transcriptional errors that may result from this process are unintentional.  Patient: Monica Kaiser Type: Established DOB: 11-30-1975 MRN: [id number] PCP: Dale [place], MD  Service: Procedure DOS: 11/23/2022 Setting: Ambulatory Location: Ambulatory outpatient facility Delivery: Face-to-face Provider: Edward Jolly, MD Specialty: Interventional Pain Management Specialty designation: 09 Location: Outpatient facility Ref. Prov.: Dale [place], MD       Interventional Therapy   Type:  Lumbar Trigger Point Injection (Myoneural Block) (1-2 muscle groups)  #1 (w/ steroids)  CPT: 20552 Laterality: Bilateral (-50)   Imaging: N/A. Landmark-guided",           Anesthesia: Local anesthesia (1-2% Lidocaine) DOS: 11/23/2022  Performed by: Edward Jolly, MD  Medical Necessity (reasoning)  Purpose: Diagnostic/Therapeutic Rationale (medical necessity): procedure needed and proper for the diagnosis and/or treatment of Monica Kaiser's medical symptoms and needs.  1. Lumbar facet arthropathy   2. Degeneration of intervertebral disc of lumbar region with discogenic back pain   3. Myofascial pain syndrome of lumbar spine    NAS-11 Pain score:   Pre-procedure: 5 /10   Post-procedure: 5 /10     Approach: Percutaneous  Type of procedure: Myoneural injection   Position  Prep  Materials  Position: Prone. Patient assisted into a comfortable position. Pressure points checked.  Prep solution: ChloraPrep (2% chlorhexidine gluconate and 70% isopropyl alcohol) The target area was identified and the area prepped in the usual manner.  Prep Area: Entire lower lumbosacral region  Materials:   Tray: Block Needle(s):   Type: Regular  Gauge (G): 22  Length: 1.5-in  Qty: 1  H&P (Pre-op Assessment):  Monica Kaiser is a 47 y.o. (year old), female patient, seen today for interventional treatment. She  has a past surgical history that includes Breast cyst excision (Right, 2003) and Colonoscopy (2004). Monica Kaiser has a current medication list which includes the following prescription(s): ascorbic acid, diclofenac, diclofenac sodium, magnesium oxide, magnesium, multiple vitamins-minerals, norgestimate-ethinyl estradiol, ondansetron, trulance, riboflavin, turmeric, and vitamin d. Her primarily concern today is the Back Pain  Initial Vital Signs:  Pulse/HCG Rate: 71  Temp: (!) 97.5 F (36.4 C) Resp: 16 BP: 119/71 SpO2: 99 %  BMI: Estimated body mass index is 21.95 kg/m as calculated from the following:   Height as of this encounter: 5\' 6"  (1.676 m).   Weight as of this encounter: 136 lb (61.7 kg).  Risk Assessment: Allergies: Reviewed. She has No Known Allergies.  Allergy Precautions: None required Coagulopathies: Reviewed. None identified.  Blood-thinner therapy: None at this time Active Infection(s): Reviewed. None identified. Monica Kaiser is afebrile  Site Confirmation: Monica Kaiser was asked to confirm the procedure and laterality before marking the site Procedure checklist: Completed Consent: Before the procedure and under the influence of no sedative(s), amnesic(s), or anxiolytics, the patient was informed of the treatment options, risks and possible complications. To fulfill our ethical and legal obligations, as recommended by the American Medical Association's Code of Ethics, I have informed the patient of my clinical impression; the nature and purpose of the treatment or procedure; the risks, benefits, and possible complications of the intervention; the alternatives, including doing nothing; the risk(s) and benefit(s) of the alternative treatment(s) or procedure(s); and the risk(s) and  benefit(s) of doing nothing. The patient was provided information about the general risks and possible complications  associated with the procedure. These may include, but are not limited to: failure to achieve desired goals, infection, bleeding, organ or nerve damage, allergic reactions, paralysis, and death. In addition, the patient was informed of those risks and complications associated to the procedure, such as failure to decrease pain; infection; bleeding; organ or nerve damage with subsequent damage to sensory, motor, and/or autonomic systems, resulting in permanent pain, numbness, and/or weakness of one or several areas of the body; allergic reactions; (i.e.: anaphylactic reaction); and/or death. Furthermore, the patient was informed of those risks and complications associated with the medications. These include, but are not limited to: allergic reactions (i.e.: anaphylactic or anaphylactoid reaction(s)); adrenal axis suppression; blood sugar elevation that in diabetics may result in ketoacidosis or comma; water retention that in patients with history of congestive heart failure may result in shortness of breath, pulmonary edema, and decompensation with resultant heart failure; weight gain; swelling or edema; medication-induced neural toxicity; particulate matter embolism and blood vessel occlusion with resultant organ, and/or nervous system infarction; and/or aseptic necrosis of one or more joints. Finally, the patient was informed that Medicine is not an exact science; therefore, there is also the possibility of unforeseen or unpredictable risks and/or possible complications that may result in a catastrophic outcome. The patient indicated having understood very clearly. We have given the patient no guarantees and we have made no promises. Enough time was given to the patient to ask questions, all of which were answered to the patient's satisfaction. Monica Kaiser has indicated that she wanted to  continue with the procedure. Attestation: I, the ordering provider, attest that I have discussed with the patient the benefits, risks, side-effects, alternatives, likelihood of achieving goals, and potential problems during recovery for the procedure that I have provided informed consent. Date  Time: 11/23/2022 12:49 PM   Pre-Procedure Preparation:  Monitoring: As per clinic protocol. Respiration, ETCO2, SpO2, BP, heart rate and rhythm monitor placed and checked for adequate function Safety Precautions: Patient was assessed for positional comfort and pressure points before starting the procedure. Time-out: I initiated and conducted the "Time-out" before starting the procedure, as per protocol. The patient was asked to participate by confirming the accuracy of the "Time Out" information. Verification of the correct person, site, and procedure were performed and confirmed by me, the nursing staff, and the patient. "Time-out" conducted as per Joint Commission's Universal Protocol (UP.01.01.01). Time: 1319 Start Time: 1319 hrs.   Narrative                Start Time: 1319 hrs.  Standard Safety Precautions: Protocol guidelines were followed. Aspiration was conducted prior to injection. At no point did I inject any substances, as a needle was being advanced. No attempts were made at seeking a paresthesia. Safe injection practices and needle disposal techniques used. Medications properly checked for expiration dates. SDV (single dose vial) medications used.  Local Anesthesia: Skin & deeper tissues infiltrated with local anesthetic. Appropriate amount of time allowed for local anesthetics to take effect.   Technical description:  The target area was identified and the area prepped in the usual manner. The procedure needles were then advanced to the target area. Proper needle placement secured. Negative aspiration confirmed. Solution injected in intermittent fashion, asking for systemic symptoms every  0.5cc of injectate. The needles were then removed and the area cleansed, making sure to leave some of the prepping solution back to take advantage of its long term bactericidal properties.  Solution made up of 9 cc of  0.2% ropivacaine, 1 cc of Decadron 10 mg/cc.  Approximately 10 trigger points injected with needling performed.  1 cc of solution above injected within each trigger point.  Vitals:   11/23/22 1303 11/23/22 1316 11/23/22 1323  BP: 119/71 107/74 107/74  Pulse: 71 77 77  Resp: 16 16 15   Temp: (!) 97.5 F (36.4 C)    SpO2: 99% 99% 100%  Weight: 136 lb (61.7 kg)    Height: 5\' 6"  (1.676 m)       End Time: 1323 hrs.  Imaging Guidance                Type of Imaging Technique: None used Indication(s): N/A Exposure Time: No patient exposure Contrast: None used. Fluoroscopic Guidance: N/A Ultrasound Guidance: N/A Interpretation: N/A   Post-operative Assessment:  Post-procedure Vital Signs:  Pulse/HCG Rate: 77  Temp: (!) 97.5 F (36.4 C) Resp: 15 BP: 107/74 SpO2: 100 %  EBL: None  Complications: No immediate post-treatment complications observed by team, or reported by patient.  Note: The patient tolerated the entire procedure well. A repeat set of vitals were taken after the procedure and the patient was kept under observation following institutional policy, for this type of procedure. Post-procedural neurological assessment was performed, showing return to baseline, prior to discharge. The patient was provided with post-procedure discharge instructions, including a section on how to identify potential problems. Should any problems arise concerning this procedure, the patient was given instructions to immediately contact us, at any time, without hesitation. In any case, we plan to contact the patient by telephone for a follow-up status report regarding this interventional procedure.  Comments:  No additional relevant information.   Plan of Care (POC)  Orders:  No  orders of the defined types were placed in this encounter.   Medications ordered for procedure: Meds ordered this encounter  Medications   diclofenac Sodium (VOLTAREN) 1 % GEL    Sig: Apply 4 g topically 4 (four) times daily as needed.    Dispense:  350 g    Refill:  PRN   dexamethasone (DECADRON) injection 10 mg   ropivacaine (PF) 2 mg/mL (0.2%) (NAROPIN) injection 9 mL   Medications administered: We administered dexamethasone and ropivacaine (PF) 2 mg/mL (0.2%).  See the medical record for exact dosing, route, and time of administration.  Follow-up plan:   Return in about 2 weeks (around 12/07/2022) for PPE, F2F.       Recent Visits Date Type Provider Dept  11/17/22 Office Visit Edward Jolly, MD [facility]  Showing recent visits within past 90 days and meeting all other requirements Today's Visits Date Type Provider Dept  11/23/22 Procedure visit Edward Jolly, MD [facility]  Showing today's visits and meeting all other requirements Future Appointments Date Type Provider Dept  12/09/22 Appointment Edward Jolly, MD [facility]  Showing future appointments within next 90 days and meeting all other requirements  Disposition: Discharge home  Discharge (Date  Time): 11/23/2022; 1330 hrs.   Primary Care Physician: Dale [place], MD Location: [facility] Note by: Edward Jolly, MD (TTS technology used. I apologize for any typographical errors that were not detected and corrected.) Date: 11/23/2022; Time: 1:36 PM  Disclaimer:  Medicine is not an Visual merchandiser. The only guarantee in medicine is that nothing is guaranteed. It is important to note that the decision to proceed with this intervention was based on the information collected from the patient. The Data and conclusions were drawn from  the patient's questionnaire, the interview, and the physical examination. Because the information was provided in large part  by the patient, it cannot be guaranteed that it has not been purposely or unconsciously manipulated. Every effort has been made to obtain as much relevant data as possible for this evaluation. It is important to note that the conclusions that lead to this procedure are derived in large part from the available data. Always take into account that the treatment will also be dependent on availability of resources and existing treatment guidelines, considered by other Pain Management Practitioners as being common knowledge and practice, at the time of the intervention. For Medico-Legal purposes, it is also important to point out that variation in procedural techniques and pharmacological choices are the acceptable norm. The indications, contraindications, technique, and results of the above procedure should only be interpreted and judged by a Board-Certified Interventional Pain Specialist with extensive familiarity and expertise in the same exact procedure and technique.

## 2022-11-24 ENCOUNTER — Other Ambulatory Visit: Payer: Self-pay

## 2022-12-09 ENCOUNTER — Encounter: Payer: Self-pay | Admitting: Student in an Organized Health Care Education/Training Program

## 2022-12-09 ENCOUNTER — Ambulatory Visit
Payer: Commercial Managed Care - PPO | Attending: Student in an Organized Health Care Education/Training Program | Admitting: Student in an Organized Health Care Education/Training Program

## 2022-12-09 VITALS — BP 120/84 | HR 82 | Temp 97.7°F | Resp 16 | Ht 66.0 in | Wt 136.0 lb

## 2022-12-09 DIAGNOSIS — M47816 Spondylosis without myelopathy or radiculopathy, lumbar region: Secondary | ICD-10-CM | POA: Diagnosis not present

## 2022-12-09 DIAGNOSIS — M7918 Myalgia, other site: Secondary | ICD-10-CM

## 2022-12-09 DIAGNOSIS — M5136 Other intervertebral disc degeneration, lumbar region with discogenic back pain only: Secondary | ICD-10-CM | POA: Diagnosis not present

## 2022-12-09 NOTE — Progress Notes (Signed)
PROVIDER NOTE: Information contained herein reflects review and annotations entered in association with encounter. Interpretation of such information and data should be left to medically-trained personnel. Information provided to patient can be located elsewhere in the medical record under "Patient Instructions". Document created using STT-dictation technology, any transcriptional errors that may result from process are unintentional.    Patient: Monica Kaiser  Service Category: E/M  Provider: Edward Jolly, MD  DOB: 1975-09-02  DOS: 12/09/2022  Referring Provider: Dale Fruitdale, MD  MRN: [id number]  Specialty: Interventional Pain Management  PCP: Dale Climax, MD  Type: Established Patient  Setting: Ambulatory outpatient    Location: Office  Delivery: Face-to-face     HPI  [name], a 47 y.o. year old female, is here today because of her Lumbar facet arthropathy [M47.816]. [name]'s primary complain today is Back Pain (lower)  Pertinent problems: [name] has Degeneration of intervertebral disc of lumbar region with discogenic back pain; Lumbar facet arthropathy; and Myofascial pain syndrome of lumbar spine on their pertinent problem list. Pain Assessment: Severity of Chronic pain is reported as a 7 /10. Location: Back Lower/denies. Onset: More than a month ago. Quality: Aching, Dull. Timing: Constant. Modifying factor(s): meds, TENS, heat, stretching, walking. Vitals:  height is 5\' 6"  (1.676 m) and weight is 136 lb (61.7 kg). Her temporal temperature is 97.7 F (36.5 C). Her blood pressure is 120/84 and her pulse is 82. Her respiration is 16 and oxygen saturation is 96%.  BMI: Estimated body mass index is 21.95 kg/m as calculated from the following:   Height as of this encounter: 5\' 6"  (1.676 m).   Weight as of this encounter: 136 lb (61.7 kg). Last encounter: 11/17/2022. Last procedure: 11/23/2022.  Reason for encounter: follow-up evaluation.  Discussed the use of  AI scribe software for clinical note transcription with the patient, who gave verbal consent to proceed.  History of Present Illness   The patient, with a history of chronic lower back pain, reports no significant improvement following recent superficial muscle trigger point injections. The pain is described as persistent and widespread across the lower back. The patient has been using Voltaren, both orally and topically, which initially provided some relief but seems less effective now.  The patient has been considering further intervention due to the ongoing discomfort, despite expressing some apprehension about more invasive procedures.       HPI from initial clinic visit 11/17/2022 Patient is a pleasant 47 year old female who presents with a chief complaint of axial low back pain in her L4-L5 distribution that radiates in a bandlike pattern laterally. She denies any pain radiation into her hips, buttocks or legs. She has tried various conservative treatments including NSAIDs, acetaminophen, massage, TENS unit, heat/cold therapy with limited response. She is also had dry needling in the past limited response. She has seen neurosurgery at [facility]. Lumbar MRI was completed, results of which are below. She does have some Modic changes at L4-L5 with mild degenerative changes. Lab work is within normal limits.    ROS  Constitutional: Denies any fever or chills Gastrointestinal: No reported hemesis, hematochezia, vomiting, or acute GI distress Musculoskeletal:  + low back pain Neurological: No reported episodes of acute onset apraxia, aphasia, dysarthria, agnosia, amnesia, paralysis, loss of coordination, or loss of consciousness  Medication Review  Ascorbic Acid, Riboflavin, Rimegepant Sulfate, Turmeric, Vitamin D, diclofenac Sodium, norgestimate-ethinyl estradiol, and ondansetron  History Review  Allergy: [name] has No Known Allergies. Drug: [name]  reports no  history of drug  use. Alcohol:  reports no history of alcohol use. Tobacco:  reports that she has never smoked. She has never used smokeless tobacco. Social: [name]  reports that she has never smoked. She has never used smokeless tobacco. She reports that she does not drink alcohol and does not use drugs. Medical:  has a past medical history of Anxiety, Fatigue, Low back pain, Migraine, and Miscarriage. Surgical: [name]  has a past surgical history that includes Breast cyst excision (Right, 2003) and Colonoscopy (2004). Family: family history includes Arthritis in her mother; Cancer in her father; Colon cancer (age of onset: 16) in her father; Diabetes in her mother; Hyperlipidemia in her mother; Hypertension in her mother; Lung cancer (age of onset: 98) in her father.  Laboratory Chemistry Profile   Renal Lab Results  Component Value Date   BUN 9 11/02/2022   CREATININE 0.57 11/02/2022   GFR 109.15 11/02/2022   GFRAA >60 06/24/2018   GFRNONAA >60 04/23/2022    Hepatic Lab Results  Component Value Date   AST 15 11/02/2022   ALT 12 11/02/2022   ALBUMIN 4.0 11/02/2022   ALKPHOS 36 (L) 11/02/2022    Electrolytes Lab Results  Component Value Date   NA 137 11/02/2022   K 4.9 11/02/2022   CL 106 11/02/2022   CALCIUM 9.2 11/02/2022    Bone Lab Results  Component Value Date   VD25OH 42.70 11/02/2022    Inflammation (CRP: Acute Phase) (ESR: Chronic Phase) Lab Results  Component Value Date   ESRSEDRATE 16 08/03/2022         Note: Above Lab results reviewed.  Recent Imaging Review  MM 3D SCREENING MAMMOGRAM BILATERAL BREAST CLINICAL DATA:  Screening.  EXAM: DIGITAL SCREENING BILATERAL MAMMOGRAM WITH TOMOSYNTHESIS AND CAD  TECHNIQUE: Bilateral screening digital craniocaudal and mediolateral oblique mammograms were obtained. Bilateral screening digital breast tomosynthesis was performed. The images were evaluated with computer-aided detection.  COMPARISON:  Previous  exam(s).  ACR Breast Density Category c: The breasts are heterogeneously dense, which may obscure small masses.  FINDINGS: There are no findings suspicious for malignancy.  IMPRESSION: No mammographic evidence of malignancy. A result letter of this screening mammogram will be mailed directly to the patient.  RECOMMENDATION: Screening mammogram in one year. (Code:SM-B-01Y)  BI-RADS CATEGORY  1: Negative.  Electronically Signed   By: Edwin Cap M.D.   On: 09/15/2022 09:11  LUMBAR MRI   T12-L1: No spinal canal or foraminal stenosis, facet joints unremarkable L1-L2: Same L2-L3: Same L3-L4: Bilobed protrusions, mild by foraminal stenosis.  No canal stenosis, descending L4 nerve root contact no compression. L4-L5: Disc bulge with left foraminal protrusion, descending L5 nerve root contact without compression, left greater than right foraminal stenosis, no canal stenosis. Mild facet changes L5-S1: Shallow central protrusion, no canal or foraminal stenosis.   Vertebral body heights are maintained and plates are intact Modic fibrovascular edema at L4-L5, L3 inferior endplate Schmorl's node with edema.    Note: Reviewed        Physical Exam  General appearance: Well nourished, well developed, and well hydrated. In no apparent acute distress Mental status: Alert, oriented x 3 (person, place, & time)       Respiratory: No evidence of acute respiratory distress Eyes: PERLA Vitals: BP 120/84   Pulse 82   Temp 97.7 F (36.5 C) (Temporal)   Resp 16   Ht 5\' 6"  (1.676 m)   Wt 136 lb (61.7 kg)   LMP 11/02/2022 (Approximate)  SpO2 96%   BMI 21.95 kg/m  BMI: Estimated body mass index is 21.95 kg/m as calculated from the following:   Height as of this encounter: 5\' 6"  (1.676 m).   Weight as of this encounter: 136 lb (61.7 kg). Ideal: Ideal body weight: 59.3 kg (130 lb 11.7 oz) Adjusted ideal body weight: 60.3 kg (132 lb 13.4 oz)  Lumbar Spine Area Exam  Skin & Axial  Inspection: No masses, redness, or swelling Alignment: Symmetrical Functional ROM: Pain restricted ROM affecting both sides Stability: No instability detected Muscle Tone/Strength: Functionally intact. No obvious neuro-muscular anomalies detected. Sensory (Neurological): Musculoskeletal pain pattern, facet mediated Palpation: No palpable anomalies       Provocative Tests: Hyperextension/rotation test: (+) bilaterally for facet joint pain.  Gait & Posture Assessment  Ambulation: Unassisted Gait: Relatively normal for age and body habitus Posture: WNL  Lower Extremity Exam    Side: Right lower extremity  Side: Left lower extremity  Stability: No instability observed          Stability: No instability observed          Skin & Extremity Inspection: Skin color, temperature, and hair growth are WNL. No peripheral edema or cyanosis. No masses, redness, swelling, asymmetry, or associated skin lesions. No contractures.  Skin & Extremity Inspection: Skin color, temperature, and hair growth are WNL. No peripheral edema or cyanosis. No masses, redness, swelling, asymmetry, or associated skin lesions. No contractures.  Functional ROM: Unrestricted ROM                  Functional ROM: Unrestricted ROM                  Muscle Tone/Strength: Functionally intact. No obvious neuro-muscular anomalies detected.  Muscle Tone/Strength: Functionally intact. No obvious neuro-muscular anomalies detected.  Sensory (Neurological): Unimpaired        Sensory (Neurological): Unimpaired        DTR: Patellar: deferred today Achilles: deferred today Plantar: deferred today  DTR: Patellar: deferred today Achilles: deferred today Plantar: deferred today  Palpation: No palpable anomalies  Palpation: No palpable anomalies    Assessment   Diagnosis Status  1. Lumbar facet arthropathy   2. Degeneration of intervertebral disc of lumbar region with discogenic back pain   3. Myofascial pain syndrome of lumbar spine     Persistent Persistent Controlled   Updated Problems: No problems updated.  Plan of Care  Problem-specific:  Assessment and Plan    Chronic Lower Back Pain related to lumbar facet arthropathy Monica Kaiser has a history of greater than 3 months of moderate to severe pain which is resulted in functional impairment.  The patient has tried various conservative therapeutic options such as NSAIDs, Tylenol, muscle relaxants, physical therapy which was inadequately effective.  Patient's pain is predominantly axial with physical exam and L-MRI findings suggestive of facet arthropathy. Lumbar facet medial branch nerve blocks were discussed with the patient.  Risks and benefits were reviewed.  Patient would like to proceed with bilateral L3, L4, L5 medial branch nerve block.  Osteoarthritis Their chronic lower back pain is also contributed to by osteoarthritis. They are currently managing this with diclofenac (Voltaren), which provides partial relief. We discussed supplementing this with acetaminophen 1000 mg three times a day, ensuring no liver dysfunction, and the importance of not combining NSAIDs.    [name] has a current medication list which includes the following long-term medication(s): norgestimate-ethinyl estradiol.  Pharmacotherapy (Medications Ordered): No orders of the  defined types were placed in this encounter.  Orders:  Orders Placed This Encounter  Procedures   LUMBAR FACET(MEDIAL BRANCH NERVE BLOCK) MBNB    Standing Status:   Future    Standing Expiration Date:   03/09/2023    Scheduling Instructions:     Procedure: Lumbar facet block (AKA.: Lumbosacral medial branch nerve block)     Side: Bilateral     Level:  L3-4, L4-5, Facets ( L3, L4, L5,Medial Branch)     Sedation: IV Versed     Timeframe: ASAA    Order Specific Question:   Where will this procedure be performed?    Answer:   [facility]   Follow-up plan:   Return in about 26 days (around  01/04/2023) for B/L L3, 4, 5 MBNB, in clinic IV Versed.     Recent Visits Date Type Provider Dept  11/23/22 Procedure visit Edward Jolly, MD [facility]  11/17/22 Office Visit Edward Jolly, MD [facility]  Showing recent visits within past 90 days and meeting all other requirements Today's Visits Date Type Provider Dept  12/09/22 Office Visit Edward Jolly, MD [facility]  Showing today's visits and meeting all other requirements Future Appointments Date Type Provider Dept  01/04/23 Appointment Edward Jolly, MD [facility]  Showing future appointments within next 90 days and meeting all other requirements  I discussed the assessment and treatment plan with the patient. The patient was provided an opportunity to ask questions and all were answered. The patient agreed with the plan and demonstrated an understanding of the instructions.  Patient advised to call back or seek an in-person evaluation if the symptoms or condition worsens.  Duration of encounter: .  Total time on encounter, as per AMA guidelines included both the face-to-face and non-face-to-face time personally spent by the physician and/or other qualified health care professional(s) on the day of the encounter (includes time in activities that require the physician or other qualified health care professional and does not include time in activities normally performed by clinical staff). Physician's time may include the following activities when performed: Preparing to see the patient (e.g., pre-charting review of records, searching for previously ordered imaging, lab work, and nerve conduction tests) Review of prior analgesic pharmacotherapies. Reviewing PMP Interpreting ordered tests (e.g., lab work, imaging, nerve conduction tests) Performing post-procedure evaluations, including interpretation of diagnostic procedures Obtaining and/or reviewing separately obtained  history Performing a medically appropriate examination and/or evaluation Counseling and educating the patient/family/caregiver Ordering medications, tests, or procedures Referring and communicating with other health care professionals (when not separately reported) Documenting clinical information in the electronic or other health record Independently interpreting results (not separately reported) and communicating results to the patient/ family/caregiver Care coordination (not separately reported)  Note by: Edward Jolly, MD Date: 12/09/2022; Time: 3:38 PM

## 2022-12-09 NOTE — Patient Instructions (Signed)
______________________________________________________________________    General Risks and Possible Complications  Patient Responsibilities: It is important that you read this as it is part of your informed consent. It is our duty to inform you of the risks and possible complications associated with treatments offered to you. It is your responsibility as a patient to read this and to ask questions about anything that is not clear or that you believe was not covered in this document.  Patient's Rights: You have the right to refuse treatment. You also have the right to change your mind, even after initially having agreed to have the treatment done. However, under this last option, if you wait until the last second to change your mind, you may be charged for the materials used up to that point.  Introduction: Medicine is not an Visual merchandiser. Everything in Medicine, including the lack of treatment(s), carries the potential for danger, harm, or loss (which is by definition: Risk). In Medicine, a complication is a secondary problem, condition, or disease that can aggravate an already existing one. All treatments carry the risk of possible complications. The fact that a side effects or complications occurs, does not imply that the treatment was conducted incorrectly. It must be clearly understood that these can happen even when everything is done following the highest safety standards.  No treatment: You can choose not to proceed with the proposed treatment alternative. The "PRO(s)" would include: avoiding the risk of complications associated with the therapy. The "CON(s)" would include: not getting any of the treatment benefits. These benefits fall under one of three categories: diagnostic; therapeutic; and/or palliative. Diagnostic benefits include: getting information which can ultimately lead to improvement of the disease or symptom(s). Therapeutic benefits are those associated with the successful  treatment of the disease. Finally, palliative benefits are those related to the decrease of the primary symptoms, without necessarily curing the condition (example: decreasing the pain from a flare-up of a chronic condition, such as incurable terminal cancer).  General Risks and Complications: These are associated to most interventional treatments. They can occur alone, or in combination. They fall under one of the following six (6) categories: no benefit or worsening of symptoms; bleeding; infection; nerve damage; allergic reactions; and/or death. No benefits or worsening of symptoms: In Medicine there are no guarantees, only probabilities. No healthcare provider can ever guarantee that a medical treatment will work, they can only state the probability that it may. Furthermore, there is always the possibility that the condition may worsen, either directly, or indirectly, as a consequence of the treatment. Bleeding: This is more common if the patient is taking a blood thinner, either prescription or over the counter (example: Goody Powders, Fish oil, Aspirin, Garlic, etc.), or if suffering a condition associated with impaired coagulation (example: Hemophilia, cirrhosis of the liver, low platelet counts, etc.). However, even if you do not have one on these, it can still happen. If you have any of these conditions, or take one of these drugs, make sure to notify your treating physician. Infection: This is more common in patients with a compromised immune system, either due to disease (example: diabetes, cancer, human immunodeficiency virus [HIV], etc.), or due to medications or treatments (example: therapies used to treat cancer and rheumatological diseases). However, even if you do not have one on these, it can still happen. If you have any of these conditions, or take one of these drugs, make sure to notify your treating physician. Nerve Damage: This is more common when the treatment is  an invasive one, but it  can also happen with the use of medications, such as those used in the treatment of cancer. The damage can occur to small secondary nerves, or to large primary ones, such as those in the spinal cord and brain. This damage may be temporary or permanent and it may lead to impairments that can range from temporary numbness to permanent paralysis and/or brain death. Allergic Reactions: Any time a substance or material comes in contact with our body, there is the possibility of an allergic reaction. These can range from a mild skin rash (contact dermatitis) to a severe systemic reaction (anaphylactic reaction), which can result in death. Death: In general, any medical intervention can result in death, most of the time due to an unforeseen complication. ______________________________________________________________________      ______________________________________________________________________    Preparing for your procedure  Appointments: If you think you may not be able to keep your appointment, call 24-48 hours in advance to cancel. We need time to make it available to others.  During your procedure appointment there will be: No Prescription Refills. No disability issues to discussed. No medication changes or discussions.  Instructions: Food intake: Avoid eating anything solid for at least 8 hours prior to your procedure. Clear liquid intake: You may take clear liquids such as water up to 2 hours prior to your procedure. (No carbonated drinks. No soda.) Transportation: Unless otherwise stated by your physician, bring a driver. (Driver cannot be a Market researcher, Pharmacist, community, or any other form of public transportation.) Morning Medicines: Except for blood thinners, take all of your other morning medications with a sip of water. Make sure to take your heart and blood pressure medicines. If your blood pressure's lower number is above 100, the case will be rescheduled. Blood thinners: Make sure to stop your blood  thinners as instructed.  If you take a blood thinner, but were not instructed to stop it, call our office [phone] and ask to talk to a nurse. Not stopping a blood thinner prior to certain procedures could lead to serious complications. Diabetics on insulin: Notify the staff so that you can be scheduled 1st case in the morning. If your diabetes requires high dose insulin, take only  of your normal insulin dose the morning of the procedure and notify the staff that you have done so. Preventing infections: Shower with an antibacterial soap the morning of your procedure.  Build-up your immune system: Take 1000 mg of Vitamin C with every meal (3 times a day) the day prior to your procedure. Antibiotics: Inform the nursing staff if you are taking any antibiotics or if you have any conditions that may require antibiotics prior to procedures. (Example: recent joint implants)   Pregnancy: If you are pregnant make sure to notify the nursing staff. Not doing so may result in injury to the fetus, including death.  Sickness: If you have a cold, fever, or any active infections, call and cancel or reschedule your procedure. Receiving steroids while having an infection may result in complications. Arrival: You must be in the facility at least 30 minutes prior to your scheduled procedure. Tardiness: Your scheduled time is also the cutoff time. If you do not arrive at least 15 minutes prior to your procedure, you will be rescheduled.  Children: Do not bring any children with you. Make arrangements to keep them home. Dress appropriately: There is always a possibility that your clothing may get soiled. Avoid long dresses. Valuables: Do not bring any jewelry  or valuables.  Reasons to call and reschedule or cancel your procedure: (Following these recommendations will minimize the risk of a serious complication.) Surgeries: Avoid having procedures within 2 weeks of any surgery. (Avoid for 2 weeks before or after any  surgery). Flu Shots: Avoid having procedures within 2 weeks of a flu shots or . (Avoid for 2 weeks before or after immunizations). Barium: Avoid having a procedure within 7-10 days after having had a radiological study involving the use of radiological contrast. (Myelograms, Barium swallow or enema study). Heart attacks: Avoid any elective procedures or surgeries for the initial 6 months after a "Myocardial Infarction" (Heart Attack). Blood thinners: It is imperative that you stop these medications before procedures. Let us know if you if you take any blood thinner.  Infection: Avoid procedures during or within two weeks of an infection (including chest colds or gastrointestinal problems). Symptoms associated with infections include: Localized redness, fever, chills, night sweats or profuse sweating, burning sensation when voiding, cough, congestion, stuffiness, runny nose, sore throat, diarrhea, nausea, vomiting, cold or Flu symptoms, recent or current infections. It is specially important if the infection is over the area that we intend to treat. Heart and lung problems: Symptoms that may suggest an active cardiopulmonary problem include: cough, chest pain, breathing difficulties or shortness of breath, dizziness, ankle swelling, uncontrolled high or unusually low blood pressure, and/or palpitations. If you are experiencing any of these symptoms, cancel your procedure and contact your primary care physician for an evaluation.  Remember:  Regular Business hours are:  Monday to Thursday 8:00 AM to 4:00 PM  Provider's Schedule: Delano Metz, MD:  Procedure days: Tuesday and Thursday 7:30 AM to 4:00 PM  Edward Jolly, MD:  Procedure days: Monday and Wednesday 7:30 AM to 4:00 PM Last  Updated: 08/25/2022 ______________________________________________________________________     Facet Blocks Patient Information  Description: The facets are joints in the spine between the vertebrae.  Like any  joints in the body, facets can become irritated and painful.  Arthritis can also effect the facets.  By injecting steroids and local anesthetic in and around these joints, we can temporarily block the nerve supply to them.  Steroids act directly on irritated nerves and tissues to reduce selling and inflammation which often leads to decreased pain.  Facet blocks may be done anywhere along the spine from the neck to the low back depending upon the location of your pain.   After numbing the skin with local anesthetic (like Novocaine), a small needle is passed onto the facet joints under x-ray guidance.  You may experience a sensation of pressure while this is being done.  The entire block usually lasts about 15-25 minutes.   Conditions which may be treated by facet blocks:  Low back/buttock pain Neck/shoulder pain Certain types of headaches  Preparation for the injection:  Do not eat any solid food or dairy products within 8 hours of your appointment. You may drink clear liquid up to 3 hours before appointment.  Clear liquids include water, black coffee, juice or soda.  No milk or cream please. You may take your regular medication, including pain medications, with a sip of water before your appointment.  Diabetics should hold regular insulin (if taken separately) and take 1/2 normal NPH dose the morning of the procedure.  Carry some sugar containing items with you to your appointment. A driver must accompany you and be prepared to drive you home after your procedure. Bring all your current medications with you. An  IV may be inserted and sedation may be given at the discretion of the physician. A blood pressure cuff, EKG and other monitors will often be applied during the procedure.  Some patients may need to have extra oxygen administered for a short period. You will be asked to provide medical information, including your allergies and medications, prior to the procedure.  We must know immediately if  you are taking blood thinners (like Coumadin/Warfarin) or if you are allergic to IV iodine contrast (dye).  We must know if you could possible be pregnant.  Possible side-effects:  Bleeding from needle site Infection (rare, may require surgery) Nerve injury (rare) Numbness & tingling (temporary) Difficulty urinating (rare, temporary) Spinal headache (a headache worse with upright posture) Light-headedness (temporary) Pain at injection site (serveral days) Decreased blood pressure (rare, temporary) Weakness in arm/leg (temporary) Pressure sensation in back/neck (temporary)   Call if you experience:  Fever/chills associated with headache or increased back/neck pain Headache worsened by an upright position New onset, weakness or numbness of an extremity below the injection site Hives or difficulty breathing (go to the emergency room) Inflammation or drainage at the injection site(s) Severe back/neck pain greater than usual New symptoms which are concerning to you  Please note:  Although the local anesthetic injected can often make your back or neck feel good for several hours after the injection, the pain will likely return. It takes 3-7 days for steroids to work.  You may not notice any pain relief for at least one week.  If effective, we will often do a series of 2-3 injections spaced 3-6 weeks apart to maximally decrease your pain.  After the initial series, you may be a candidate for a more permanent nerve block of the facets.  If you have any questions, please call #[phone] [facility]

## 2022-12-14 ENCOUNTER — Other Ambulatory Visit: Payer: Self-pay

## 2022-12-14 DIAGNOSIS — G43709 Chronic migraine without aura, not intractable, without status migrainosus: Secondary | ICD-10-CM | POA: Diagnosis not present

## 2022-12-14 DIAGNOSIS — Z8639 Personal history of other endocrine, nutritional and metabolic disease: Secondary | ICD-10-CM | POA: Diagnosis not present

## 2022-12-14 MED ORDER — ONDANSETRON 4 MG PO TBDP
4.0000 mg | ORAL_TABLET | Freq: Three times a day (TID) | ORAL | 2 refills | Status: AC | PRN
  Filled 2022-12-14 – 2023-01-20 (×3): qty 20, 7d supply, fill #0
  Filled 2023-07-13: qty 20, 7d supply, fill #1
  Filled 2023-10-26 – 2023-11-11 (×2): qty 20, 7d supply, fill #2

## 2022-12-14 MED ORDER — NURTEC 75 MG PO TBDP
75.0000 mg | ORAL_TABLET | Freq: Every day | ORAL | 3 refills | Status: AC | PRN
  Filled 2022-12-14 – 2022-12-16 (×2): qty 16, 30d supply, fill #0
  Filled 2023-01-04: qty 16, 16d supply, fill #0
  Filled 2023-01-04: qty 16, 30d supply, fill #0
  Filled 2023-01-20 – 2023-02-01 (×3): qty 16, 16d supply, fill #0
  Filled 2023-02-02: qty 16, 32d supply, fill #0
  Filled 2023-07-13: qty 16, 32d supply, fill #1
  Filled 2023-10-26 – 2023-11-11 (×2): qty 16, 32d supply, fill #2

## 2022-12-16 ENCOUNTER — Other Ambulatory Visit: Payer: Self-pay

## 2022-12-18 ENCOUNTER — Encounter: Payer: Self-pay | Admitting: Internal Medicine

## 2022-12-18 ENCOUNTER — Ambulatory Visit: Payer: Commercial Managed Care - PPO | Admitting: Internal Medicine

## 2022-12-18 DIAGNOSIS — R102 Pelvic and perineal pain: Secondary | ICD-10-CM

## 2022-12-21 NOTE — Telephone Encounter (Signed)
Spoke with pt and she stated that she would like to go ahead with the pelvic US. Pt stated that as far as the constipation, she did have a bowel movement this morning. She stated that she use suppositories more often than she probably should. She stated that she also uses OTC dulcolax, and colace. She stated that she has also tried the Trulance and Linzess in the past and they did not work for her.

## 2022-12-21 NOTE — Telephone Encounter (Signed)
Received today - if no bowel movement in one week, needs to use a suppository (dulcolax) or enema - to get bowels moving.  Need to do this first and then can prescribe oral medication.  I can order a pelvic ultrasound if she is still having pain.

## 2022-12-21 NOTE — Telephone Encounter (Signed)
I have placed order for pelvic ultrasound. Per review of chart, she has been seeing Dr Marina Goodell (GI). Per his last note, was going to try [place]. Did she try this medication?  Also, does she have a f/u appt with GI given significant issues with constipation despite multiple medications.

## 2022-12-22 NOTE — Telephone Encounter (Signed)
Pt did not try the amitiza. She is going to reach out to Dr [facility] office about the lactulose and getting a follow up. She says that she may have to find GI doc. She called them in August for an appt and was given first available in Dec. Was moved up to Oct. Did not feel visit with them did her any good but she is going to reach back out. Patient is going to call Dr Tenny Craw office and let me know if referral is required.

## 2022-12-22 NOTE — Telephone Encounter (Signed)
See previous note. This is a continuation of her previous message - I can refer to an allergist.  Dr Niland Callas - allergist in [place]. Does she want to stay in [facility]

## 2022-12-25 ENCOUNTER — Other Ambulatory Visit: Payer: Self-pay

## 2022-12-28 ENCOUNTER — Ambulatory Visit
Admission: RE | Admit: 2022-12-28 | Discharge: 2022-12-28 | Disposition: A | Discharge status: Home / Self Care | Payer: Commercial Managed Care - PPO | Source: Ambulatory Visit | Attending: Internal Medicine | Admitting: Internal Medicine

## 2022-12-28 ENCOUNTER — Other Ambulatory Visit: Payer: Self-pay

## 2022-12-28 DIAGNOSIS — N8302 Follicular cyst of left ovary: Secondary | ICD-10-CM | POA: Diagnosis not present

## 2022-12-28 DIAGNOSIS — N83292 Other ovarian cyst, left side: Secondary | ICD-10-CM | POA: Diagnosis not present

## 2022-12-28 DIAGNOSIS — R102 Pelvic and perineal pain: Secondary | ICD-10-CM | POA: Insufficient documentation

## 2022-12-28 DIAGNOSIS — N854 Malposition of uterus: Secondary | ICD-10-CM | POA: Diagnosis not present

## 2023-01-04 ENCOUNTER — Ambulatory Visit
Payer: Commercial Managed Care - PPO | Attending: Student in an Organized Health Care Education/Training Program | Admitting: Student in an Organized Health Care Education/Training Program

## 2023-01-04 ENCOUNTER — Encounter: Payer: Self-pay | Admitting: Student in an Organized Health Care Education/Training Program

## 2023-01-04 ENCOUNTER — Telehealth: Payer: Self-pay | Admitting: Student in an Organized Health Care Education/Training Program

## 2023-01-04 ENCOUNTER — Ambulatory Visit
Admission: RE | Admit: 2023-01-04 | Discharge: 2023-01-04 | Disposition: A | Discharge status: Home / Self Care | Payer: Commercial Managed Care - PPO | Source: Ambulatory Visit | Attending: Student in an Organized Health Care Education/Training Program | Admitting: Student in an Organized Health Care Education/Training Program

## 2023-01-04 ENCOUNTER — Ambulatory Visit: Payer: Commercial Managed Care - PPO | Admitting: Internal Medicine

## 2023-01-04 ENCOUNTER — Other Ambulatory Visit: Payer: Self-pay

## 2023-01-04 VITALS — BP 132/88 | HR 92 | Temp 98.1°F | Resp 16 | Ht 66.0 in | Wt 136.0 lb

## 2023-01-04 DIAGNOSIS — M5136 Other intervertebral disc degeneration, lumbar region with discogenic back pain only: Secondary | ICD-10-CM | POA: Insufficient documentation

## 2023-01-04 DIAGNOSIS — M47816 Spondylosis without myelopathy or radiculopathy, lumbar region: Secondary | ICD-10-CM | POA: Diagnosis not present

## 2023-01-04 MED ORDER — LACTATED RINGERS IV SOLN: Freq: Once | INTRAVENOUS | Status: AC

## 2023-01-04 MED ORDER — LIDOCAINE HCL 2 % IJ SOLN
20.0000 mL | Freq: Once | INTRAMUSCULAR | Status: AC
  Administered 2023-01-04: 400 mg
  Filled 2023-01-04: qty 40

## 2023-01-04 MED ORDER — MIDAZOLAM HCL 2 MG/2ML IJ SOLN
0.5000 mg | Freq: Once | INTRAMUSCULAR | Status: AC
  Administered 2023-01-04: 2 mg via INTRAVENOUS
  Filled 2023-01-04: qty 2

## 2023-01-04 MED ORDER — ROPIVACAINE HCL 2 MG/ML IJ SOLN
18.0000 mL | Freq: Once | INTRAMUSCULAR | Status: AC
  Administered 2023-01-04: 18 mL via PERINEURAL
  Filled 2023-01-04: qty 20

## 2023-01-04 MED ORDER — DEXAMETHASONE SODIUM PHOSPHATE 10 MG/ML IJ SOLN
20.0000 mg | Freq: Once | INTRAMUSCULAR | Status: AC
  Administered 2023-01-04: 20 mg
  Filled 2023-01-04: qty 2

## 2023-01-04 NOTE — Progress Notes (Signed)
Safety precautions to be maintained throughout the outpatient stay will include: orient to surroundings, keep bed in low position, maintain call bell within reach at all times, provide assistance with transfer out of bed and ambulation.  

## 2023-01-04 NOTE — Telephone Encounter (Signed)
Patient called and asks whether or not it is ok to continue the Excedrin migraine medication.  Informed patient that it was ok to continue if that was what she previously took for headaches.

## 2023-01-04 NOTE — Progress Notes (Signed)
PROVIDER NOTE: Interpretation of information contained herein should be left to medically-trained personnel. Specific patient instructions are provided elsewhere under "Patient Instructions" section of medical record. This document was created in part using STT-dictation technology, any transcriptional errors that may result from this process are unintentional.  Patient: Monica Kaiser Type: Established DOB: [birth year] MRN: [id number] PCP: Dale Alfalfa, MD  Service: Procedure DOS: 01/04/2023 Setting: Ambulatory Location: Ambulatory outpatient facility Delivery: Face-to-face Provider: Edward Jolly, MD Specialty: Interventional Pain Management Specialty designation: 09 Location: Outpatient facility Ref. Prov.: Dale Leslie, MD       Interventional Therapy   Type: Lumbar Facet, Medial Branch Block(s) (w/ fluoroscopic mapping)          Laterality: Bilateral  Level: L3, L4, and L5 Medial Branch Level(s). Injecting these levels blocks the L3-4 and L4-5 lumbar facet joints. Imaging: Fluoroscopic guidance         Anesthesia: Local anesthesia (1-2% Lidocaine) Sedation: Minimal Sedation                       DOS: 01/04/2023 Performed by: Edward Jolly, MD  Primary Purpose: Diagnostic/Therapeutic Indications: Low back pain severe enough to impact quality of life or function. 1. Lumbar facet arthropathy   2. Degeneration of intervertebral disc of lumbar region with discogenic back pain    NAS-11 Pain score:   Pre-procedure: 7 /10   Post-procedure: 0-No pain/10     Position / Prep / Materials:  Position: Prone  Prep solution: ChloraPrep (2% chlorhexidine gluconate and 70% isopropyl alcohol) Area Prepped: Posterolateral Lumbosacral Spine (Wide prep: From the lower border of the scapula down to the end of the tailbone and from flank to flank.)  Materials:  Tray: Block Needle(s):  Type: Spinal  Gauge (G): 22  Length: 3.5-in Qty: 2      H&P (Pre-op Assessment):  Monica Kaiser is  a 47 y.o. (year old), female patient, seen today for interventional treatment. She  has a past surgical history that includes Breast cyst excision (Right, 2003) and Colonoscopy (2004). Monica Kaiser has a current medication list which includes the following prescription(s): ascorbic acid, diclofenac sodium, norgestimate-ethinyl estradiol, ondansetron, riboflavin, nurtec, turmeric, vitamin d, and ondansetron, and the following Facility-Administered Medications: lactated ringers. Her primarily concern today is the Back Pain (Lumbar bilateral )  Initial Vital Signs:  Pulse/HCG Rate: 92ECG Heart Rate: (!) 120 Temp: 98.1 F (36.7 C) Resp: 16 BP: 126/89 SpO2: 99 %  BMI: Estimated body mass index is 21.95 kg/m as calculated from the following:   Height as of this encounter: 5\' 6"  (1.676 m).   Weight as of this encounter: 136 lb (61.7 kg).  Risk Assessment: Allergies: Reviewed. She has no known allergies.  Allergy Precautions: None required Coagulopathies: Reviewed. None identified.  Blood-thinner therapy: None at this time Active Infection(s): Reviewed. None identified. Monica Kaiser is afebrile  Site Confirmation: Monica Kaiser was asked to confirm the procedure and laterality before marking the site Procedure checklist: Completed Consent: Before the procedure and under the influence of no sedative(s), amnesic(s), or anxiolytics, the patient was informed of the treatment options, risks and possible complications. To fulfill our ethical and legal obligations, as recommended by the American Medical Association's Code of Ethics, I have informed the patient of my clinical impression; the nature and purpose of the treatment or procedure; the risks, benefits, and possible complications of the intervention; the alternatives, including doing nothing; the risk(s) and benefit(s) of the alternative treatment(s) or procedure(s); and the risk(s) and benefit(s)  of doing nothing. The patient was provided  information about the general risks and possible complications associated with the procedure. These may include, but are not limited to: failure to achieve desired goals, infection, bleeding, organ or nerve damage, allergic reactions, paralysis, and death. In addition, the patient was informed of those risks and complications associated to Spine-related procedures, such as failure to decrease pain; infection (i.e.: Meningitis, epidural or intraspinal abscess); bleeding (i.e.: epidural hematoma, subarachnoid hemorrhage, or any other type of intraspinal or peri-dural bleeding); organ or nerve damage (i.e.: Any type of peripheral nerve, nerve root, or spinal cord injury) with subsequent damage to sensory, motor, and/or autonomic systems, resulting in permanent pain, numbness, and/or weakness of one or several areas of the body; allergic reactions; (i.e.: anaphylactic reaction); and/or death. Furthermore, the patient was informed of those risks and complications associated with the medications. These include, but are not limited to: allergic reactions (i.e.: anaphylactic or anaphylactoid reaction(s)); adrenal axis suppression; blood sugar elevation that in diabetics may result in ketoacidosis or comma; water retention that in patients with history of congestive heart failure may result in shortness of breath, pulmonary edema, and decompensation with resultant heart failure; weight gain; swelling or edema; medication-induced neural toxicity; particulate matter embolism and blood vessel occlusion with resultant organ, and/or nervous system infarction; and/or aseptic necrosis of one or more joints. Finally, the patient was informed that Medicine is not an exact science; therefore, there is also the possibility of unforeseen or unpredictable risks and/or possible complications that may result in a catastrophic outcome. The patient indicated having understood very clearly. We have given the patient no guarantees and we  have made no promises. Enough time was given to the patient to ask questions, all of which were answered to the patient's satisfaction. Monica Kaiser has indicated that she wanted to continue with the procedure. Attestation: I, the ordering provider, attest that I have discussed with the patient the benefits, risks, side-effects, alternatives, likelihood of achieving goals, and potential problems during recovery for the procedure that I have provided informed consent. Date  Time: 01/04/2023 10:58 AM   Pre-Procedure Preparation:  Monitoring: As per clinic protocol. Respiration, ETCO2, SpO2, BP, heart rate and rhythm monitor placed and checked for adequate function Safety Precautions: Patient was assessed for positional comfort and pressure points before starting the procedure. Time-out: I initiated and conducted the "Time-out" before starting the procedure, as per protocol. The patient was asked to participate by confirming the accuracy of the "Time Out" information. Verification of the correct person, site, and procedure were performed and confirmed by me, the nursing staff, and the patient. "Time-out" conducted as per Joint Commission's Universal Protocol (UP.01.01.01). Time: 1127 Start Time: 1127 hrs.  Description of Procedure:          Laterality: (see above) Targeted Levels: (see above)  Safety Precautions: Aspiration looking for blood return was conducted prior to all injections. At no point did we inject any substances, as a needle was being advanced. Before injecting, the patient was told to immediately notify me if she was experiencing any new onset of "ringing in the ears, or metallic taste in the mouth". No attempts were made at seeking any paresthesias. Safe injection practices and needle disposal techniques used. Medications properly checked for expiration dates. SDV (single dose vial) medications used. After the completion of the procedure, all disposable equipment used was discarded in  the proper designated medical waste containers. Local Anesthesia: Protocol guidelines were followed. The patient was positioned over the fluoroscopy  table. The area was prepped in the usual manner. The time-out was completed. The target area was identified using fluoroscopy. A 12-in long, straight, sterile hemostat was used with fluoroscopic guidance to locate the targets for each level blocked. Once located, the skin was marked with an approved surgical skin marker. Once all sites were marked, the skin (epidermis, dermis, and hypodermis), as well as deeper tissues (fat, connective tissue and muscle) were infiltrated with a small amount of a short-acting local anesthetic, loaded on a 10cc syringe with a 25G, 1.5-in  Needle. An appropriate amount of time was allowed for local anesthetics to take effect before proceeding to the next step. Local Anesthetic: Lidocaine 2.0% The unused portion of the local anesthetic was discarded in the proper designated containers. Technical description of process:   L3 Medial Branch Nerve Block (MBB): The target area for the L3 medial branch is at the junction of the postero-lateral aspect of the superior articular process and the superior, posterior, and medial edge of the transverse process of L4. Under fluoroscopic guidance, a Quincke needle was inserted until contact was made with os over the superior postero-lateral aspect of the pedicular shadow (target area). After negative aspiration for blood, 2 mL of the nerve block solution was injected without difficulty or complication. The needle was removed intact. L4 Medial Branch Nerve Block (MBB): The target area for the L4 medial branch is at the junction of the postero-lateral aspect of the superior articular process and the superior, posterior, and medial edge of the transverse process of L5. Under fluoroscopic guidance, a Quincke needle was inserted until contact was made with os over the superior postero-lateral aspect of  the pedicular shadow (target area). After negative aspiration for blood, 2 mL of the nerve block solution was injected without difficulty or complication. The needle was removed intact. L5 Medial Branch Nerve Block (MBB): The target area for the L5 medial branch is at the junction of the postero-lateral aspect of the superior articular process and the superior, posterior, and medial edge of the sacral ala. Under fluoroscopic guidance, a Quincke needle was inserted until contact was made with os over the superior postero-lateral aspect of the pedicular shadow (target area). After negative aspiration for blood, 2mL of the nerve block solution was injected without difficulty or complication. The needle was removed intact.   Once the entire procedure was completed, the treated area was cleaned, making sure to leave some of the prepping solution back to take advantage of its long term bactericidal properties.         Illustration of the posterior view of the lumbar spine and the posterior neural structures. Laminae of L2 through S1 are labeled. DPRL5, dorsal primary ramus of L5; DPRS1, dorsal primary ramus of S1; DPR3, dorsal primary ramus of L3; FJ, facet (zygapophyseal) joint L3-L4; I, inferior articular process of L4; LB1, lateral branch of dorsal primary ramus of L1; IAB, inferior articular branches from L3 medial branch (supplies L4-L5 facet joint); IBP, intermediate branch plexus; MB3, medial branch of dorsal primary ramus of L3; NR3, third lumbar nerve root; S, superior articular process of L5; SAB, superior articular branches from L4 (supplies L4-5 facet joint also); TP3, transverse process of L3.   Facet Joint Innervation (* possible contribution)  L1-2 T12, L1 (L2*)  Medial Branch  L2-3 L1, L2 (L3*)         "          "  L3-4 L2, L3 (L4*)         "          "  L4-5 L3, L4 (L5*)         "          "  L5-S1 L4, L5, S1          "          "    Vitals:   01/04/23 1126 01/04/23 1130 01/04/23  1133 01/04/23 1138  BP: (!) 130/101 127/87 (!) 120/91 132/88  Pulse:      Resp: 18 17 18 16   Temp:      TempSrc:      SpO2: 99% 100% 99% 100%  Weight:      Height:         End Time: 1133 hrs.  Imaging Guidance (Spinal):          Type of Imaging Technique: Fluoroscopy Guidance (Spinal) Indication(s): Fluoroscopy guidance for needle placement to enhance accuracy in procedures requiring precise needle localization for targeted delivery of medication in or near specific anatomical locations not easily accessible without such real-time imaging assistance. Exposure Time: Please see nurses notes. Contrast: None used. Fluoroscopic Guidance: I was personally present during the use of fluoroscopy. "Tunnel Vision Technique" used to obtain the best possible view of the target area. Parallax error corrected before commencing the procedure. "Direction-depth-direction" technique used to introduce the needle under continuous pulsed fluoroscopy. Once target was reached, antero-posterior, oblique, and lateral fluoroscopic projection used confirm needle placement in all planes. Images permanently stored in EMR. Interpretation: No contrast injected. I personally interpreted the imaging intraoperatively. Adequate needle placement confirmed in multiple planes. Permanent images saved into the patient's record.  Post-operative Assessment:  Post-procedure Vital Signs:  Pulse/HCG Rate: 9291 Temp: 98.1 F (36.7 C) Resp: 16 BP: 132/88 SpO2: 100 %  EBL: None  Complications: No immediate post-treatment complications observed by team, or reported by patient.  Note: The patient tolerated the entire procedure well. A repeat set of vitals were taken after the procedure and the patient was kept under observation following institutional policy, for this type of procedure. Post-procedural neurological assessment was performed, showing return to baseline, prior to discharge. The patient was provided with post-procedure  discharge instructions, including a section on how to identify potential problems. Should any problems arise concerning this procedure, the patient was given instructions to immediately contact us, at any time, without hesitation. In any case, we plan to contact the patient by telephone for a follow-up status report regarding this interventional procedure.  Comments:  No additional relevant information.  Plan of Care (POC)  Orders:  Orders Placed This Encounter  Procedures   DG PAIN CLINIC C-ARM 1-60 MIN NO REPORT    Intraoperative interpretation by procedural physician at [facility].    Standing Status:   Standing    Number of Occurrences:   1    Reason for exam::   Assistance in needle guidance and placement for procedures requiring needle placement in or near specific anatomical locations not easily accessible without such assistance.    Medications ordered for procedure: Meds ordered this encounter  Medications   lidocaine (XYLOCAINE) 2 % (with pres) injection 400 mg   lactated ringers infusion   midazolam (VERSED) injection 0.5-2 mg    Make sure Flumazenil is available in the pyxis when using this medication. If oversedation occurs, administer 0.2 mg IV over 15 sec. If after 45 sec no response, administer 0.2 mg again over 1 min; may repeat at 1 min intervals; not to exceed 4 doses (1 mg)   ropivacaine (PF) 2  mg/mL (0.2%) (NAROPIN) injection 18 mL   dexamethasone (DECADRON) injection 20 mg   Medications administered: We administered lidocaine, lactated ringers, midazolam, ropivacaine (PF) 2 mg/mL (0.2%), and dexamethasone.  See the medical record for exact dosing, route, and time of administration.  Follow-up plan:   Return in about 4 weeks (around 02/01/2023) for PPE, F2F.       BLF 01/04/23    Recent Visits Date Type Provider Dept  12/09/22 Office Visit Edward Jolly, MD [facility]  11/23/22 Procedure visit Edward Jolly, MD [facility]   11/17/22 Office Visit Edward Jolly, MD [facility]  Showing recent visits within past 90 days and meeting all other requirements Today's Visits Date Type Provider Dept  01/04/23 Procedure visit Edward Jolly, MD [facility]  Showing today's visits and meeting all other requirements Future Appointments Date Type Provider Dept  02/01/23 Appointment Edward Jolly, MD [facility]  Showing future appointments within next 90 days and meeting all other requirements  Disposition: Discharge home  Discharge (Date  Time): 01/04/2023; 1145 hrs.   Primary Care Physician: Dale [place], MD Location: [facility] Note by: Edward Jolly, MD (TTS technology used. I apologize for any typographical errors that were not detected and corrected.) Date: 01/04/2023; Time: 11:42 AM  Disclaimer:  Medicine is not an Visual merchandiser. The only guarantee in medicine is that nothing is guaranteed. It is important to note that the decision to proceed with this intervention was based on the information collected from the patient. The Data and conclusions were drawn from the patient's questionnaire, the interview, and the physical examination. Because the information was provided in large part by the patient, it cannot be guaranteed that it has not been purposely or unconsciously manipulated. Every effort has been made to obtain as much relevant data as possible for this evaluation. It is important to note that the conclusions that lead to this procedure are derived in large part from the available data. Always take into account that the treatment will also be dependent on availability of resources and existing treatment guidelines, considered by other Pain Management Practitioners as being common knowledge and practice, at the time of the intervention. For Medico-Legal purposes, it is also important to point out that variation in procedural techniques and  pharmacological choices are the acceptable norm. The indications, contraindications, technique, and results of the above procedure should only be interpreted and judged by a Board-Certified Interventional Pain Specialist with extensive familiarity and expertise in the same exact procedure and technique.

## 2023-01-04 NOTE — Patient Instructions (Signed)

## 2023-01-04 NOTE — Telephone Encounter (Signed)
PT wants to know what can she take for a headache. PT stated that she has had an headache since the procedure. Please give patient a call. TY

## 2023-01-05 ENCOUNTER — Telehealth: Payer: Self-pay | Admitting: *Deleted

## 2023-01-05 ENCOUNTER — Other Ambulatory Visit: Payer: Self-pay

## 2023-01-05 NOTE — Telephone Encounter (Signed)
No problems post procedure. 

## 2023-01-08 ENCOUNTER — Ambulatory Visit: Payer: Commercial Managed Care - PPO | Admitting: Internal Medicine

## 2023-01-19 ENCOUNTER — Other Ambulatory Visit: Payer: Self-pay

## 2023-01-20 ENCOUNTER — Other Ambulatory Visit: Payer: Self-pay | Admitting: *Deleted

## 2023-01-20 ENCOUNTER — Other Ambulatory Visit: Payer: Self-pay

## 2023-01-20 ENCOUNTER — Encounter: Payer: Self-pay | Admitting: Student in an Organized Health Care Education/Training Program

## 2023-01-20 DIAGNOSIS — M7918 Myalgia, other site: Secondary | ICD-10-CM

## 2023-01-20 DIAGNOSIS — G894 Chronic pain syndrome: Secondary | ICD-10-CM | POA: Insufficient documentation

## 2023-01-20 DIAGNOSIS — M47816 Spondylosis without myelopathy or radiculopathy, lumbar region: Secondary | ICD-10-CM

## 2023-01-20 MED ORDER — DICLOFENAC SODIUM 50 MG PO TBEC
50.0000 mg | DELAYED_RELEASE_TABLET | Freq: Two times a day (BID) | ORAL | 2 refills | Status: AC
  Filled 2023-01-20: qty 60, 30d supply, fill #0
  Filled 2023-02-12 – 2023-03-17 (×3): qty 60, 30d supply, fill #1
  Filled 2023-04-12: qty 60, 30d supply, fill #2

## 2023-01-20 NOTE — Progress Notes (Signed)
 Patient informed of lab orders and Diclofenac  prescription.

## 2023-01-20 NOTE — Telephone Encounter (Signed)
 Rx request with note about labs sent to Dr. Rhesa Celeste.

## 2023-01-21 ENCOUNTER — Other Ambulatory Visit
Admission: RE | Admit: 2023-01-21 | Discharge: 2023-01-21 | Disposition: A | Discharge status: Home / Self Care | Payer: Commercial Managed Care - PPO | Attending: Student in an Organized Health Care Education/Training Program | Admitting: Student in an Organized Health Care Education/Training Program

## 2023-01-21 DIAGNOSIS — G894 Chronic pain syndrome: Secondary | ICD-10-CM | POA: Insufficient documentation

## 2023-01-21 LAB — MAGNESIUM: Magnesium: 2.1 mg/dL (ref 1.7–2.4)

## 2023-01-21 LAB — C-REACTIVE PROTEIN: CRP: 0.6 mg/dL (ref <1.0)

## 2023-01-22 LAB — ANA COMPREHENSIVE PANEL
Anti JO-1: <0.2 AI (ref 0.0–0.9)
Centromere Ab Screen: <0.2 AI (ref 0.0–0.9)
Chromatin Ab SerPl-aCnc: <0.2 AI (ref 0.0–0.9)
ENA SM Ab Ser-aCnc: <0.2 AI (ref 0.0–0.9)
Ribonucleic Protein: <0.2 AI (ref 0.0–0.9)
SSA (Ro) (ENA) Antibody, IgG: <0.2 AI (ref 0.0–0.9)
SSB (La) (ENA) Antibody, IgG: <0.2 AI (ref 0.0–0.9)
Scleroderma (Scl-70) (ENA) Antibody, IgG: <0.2 AI (ref 0.0–0.9)
ds DNA Ab: 1 [IU]/mL (ref 0–9)

## 2023-01-22 LAB — RHEUMATOID FACTOR: Rheumatoid fact SerPl-aCnc: <10 [IU]/mL (ref <14.0)

## 2023-01-26 ENCOUNTER — Other Ambulatory Visit: Payer: Self-pay

## 2023-01-27 LAB — HLA-B27 ANTIGEN: HLA-B27: NEGATIVE

## 2023-01-29 ENCOUNTER — Encounter: Payer: Self-pay | Admitting: Student in an Organized Health Care Education/Training Program

## 2023-01-31 NOTE — Progress Notes (Unsigned)
Subjective:    Patient ID: Monica Kaiser, female    DOB: 1975-01-16, 48 y.o.   MRN: [id number]  Patient here for No chief complaint on file.   HPI Here for a scheduled follow up - follow up regarding fatigue and back pain. Problems with constipation. Saw GI 11/04/22. Had tried linzess previously. Recommended trial of trulance. Recommend f/u colonoscopy 09/2026. Had f/u with neurology 12/14/22 - f/u chronic migraine. Recommended zofran prn and nurtec prn.    Past Medical History:  Diagnosis Date   Anxiety    Fatigue    Low back pain    Migraine    Miscarriage    Past Surgical History:  Procedure Laterality Date   BREAST CYST EXCISION Right 2003   benign    COLONOSCOPY  2004   Family History  Problem Relation Age of Onset   Lung cancer Father 62   Colon cancer Father 47   Cancer Father        lung and colon cancer age 11   Arthritis Mother    Diabetes Mother    Hyperlipidemia Mother    Hypertension Mother    Breast cancer Neg Hx    Social History   Socioeconomic History   Marital status: Married    Spouse name: Not on file   Number of children: 2   Years of education: Not on file   Highest education level: Not on file  Occupational History   Not on file  Tobacco Use   Smoking status: Never   Smokeless tobacco: Never  Vaping Use   Vaping status: Never Used  Substance and Sexual Activity   Alcohol use: No   Drug use: No   Sexual activity: Yes  Other Topics Concern   Not on file  Social History Narrative   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 [place]   2 kids    Married    No guns   Wears seat belt    Social Drivers of Corporate investment banker Strain: Not on file  Food Insecurity: No Food Insecurity (09/25/2022)   Received from [facility]   Hunger Vital Sign    Worried About Running Out of Food in the Last Year: Never true    Ran Out of Food in the Last Year: Never true  Transportation Needs: No  Transportation Needs (09/25/2022)   Received from [facility] - Transportation    In the past 12 months, has lack of transportation kept you from medical appointments or from getting medications?: No    Lack of Transportation (Non-Medical): No  Physical Activity: Not on file  Stress: Not on file  Social Connections: Not on file     Review of Systems     Objective:     There were no vitals taken for this visit. Wt Readings from Last 3 Encounters:  01/04/23 136 lb (61.7 kg)  12/09/22 136 lb (61.7 kg)  11/23/22 136 lb (61.7 kg)    Physical Exam  {Perform Simple Foot Exam  Perform Detailed exam:1} {Insert foot Exam (Optional):30965}   Outpatient Encounter Medications as of 02/01/2023  Medication Sig   Ascorbic Acid (VITAMIN C PO) Take by mouth.   diclofenac (VOLTAREN) 50 MG EC tablet Take 1 tablet (50 mg total) by mouth 2 (two) times daily.   diclofenac Sodium (VOLTAREN) 1 % GEL Apply 4 g topically 4 (four) times daily as  needed.   norgestimate-ethinyl estradiol (ORTHO-CYCLEN) 0.25-35 MG-MCG tablet Take 1 tablet by mouth once daily   ondansetron (ZOFRAN-ODT) 4 MG disintegrating tablet Take 1 tablet (4 mg total) by mouth every 8 (eight) hours as needed for nausea.   ondansetron (ZOFRAN-ODT) 4 MG disintegrating tablet Take 1 tablet (4 mg total) by mouth every 8 (eight) hours as needed for nausea. (Patient not taking: Reported on 01/04/2023)   Riboflavin (B2 PO) Take by mouth.   Rimegepant Sulfate (NURTEC) 75 MG TBDP Take 1 tablet (75 mg total) by mouth daily as needed for rescue treatment of migraine.   Turmeric (QC TUMERIC COMPLEX) 500 MG CAPS Take 500 mg by mouth daily.   VITAMIN D PO Take by mouth.   No facility-administered encounter medications on file as of 02/01/2023.     Lab Results  Component Value Date   WBC 6.6 11/02/2022   HGB 13.1 11/02/2022   HCT 39.4 11/02/2022   PLT 328.0 11/02/2022   GLUCOSE 90 11/02/2022   CHOL 185 08/03/2022    TRIG 144.0 08/03/2022   HDL 54.80 08/03/2022   LDLCALC 101 (H) 08/03/2022   ALT 12 11/02/2022   AST 15 11/02/2022   NA 137 11/02/2022   K 4.9 11/02/2022   CL 106 11/02/2022   CREATININE 0.57 11/02/2022   BUN 9 11/02/2022   CO2 26 11/02/2022   TSH 0.64 11/02/2022   HGBA1C 5.5 10/05/2018    No results found.     Assessment & Plan:  There are no diagnoses linked to this encounter.   Dale Cutler, MD

## 2023-02-01 ENCOUNTER — Encounter: Payer: Self-pay | Admitting: Pharmacist

## 2023-02-01 ENCOUNTER — Encounter: Payer: Self-pay | Admitting: Internal Medicine

## 2023-02-01 ENCOUNTER — Ambulatory Visit: Payer: Commercial Managed Care - PPO | Admitting: Student in an Organized Health Care Education/Training Program

## 2023-02-01 ENCOUNTER — Ambulatory Visit: Payer: Commercial Managed Care - PPO | Admitting: Internal Medicine

## 2023-02-01 ENCOUNTER — Other Ambulatory Visit: Payer: Self-pay

## 2023-02-01 VITALS — BP 106/68 | HR 76 | Temp 98.2°F | Resp 16 | Ht 66.0 in | Wt 136.8 lb

## 2023-02-01 DIAGNOSIS — M545 Low back pain, unspecified: Secondary | ICD-10-CM

## 2023-02-01 DIAGNOSIS — K5909 Other constipation: Secondary | ICD-10-CM | POA: Diagnosis not present

## 2023-02-01 DIAGNOSIS — G43919 Migraine, unspecified, intractable, without status migrainosus: Secondary | ICD-10-CM

## 2023-02-01 DIAGNOSIS — E559 Vitamin D deficiency, unspecified: Secondary | ICD-10-CM

## 2023-02-01 LAB — COMPREHENSIVE METABOLIC PANEL
ALT: 19 U/L (ref 0–35)
AST: 16 U/L (ref 0–37)
Albumin: 4 g/dL (ref 3.5–5.2)
Alkaline Phosphatase: 38 U/L — ABNORMAL LOW (ref 39–117)
BUN: 12 mg/dL (ref 6–23)
CO2: 26 meq/L (ref 19–32)
Calcium: 8.7 mg/dL (ref 8.4–10.5)
Chloride: 105 meq/L (ref 96–112)
Creatinine, Ser: 0.59 mg/dL (ref 0.40–1.20)
GFR: 108.06 mL/min (ref >60.00)
Glucose, Bld: 92 mg/dL (ref 70–99)
Potassium: 4.3 meq/L (ref 3.5–5.1)
Sodium: 138 meq/L (ref 135–145)
Total Bilirubin: 0.4 mg/dL (ref 0.2–1.2)
Total Protein: 6.8 g/dL (ref 6.0–8.3)

## 2023-02-01 LAB — CBC WITH DIFFERENTIAL/PLATELET
Basophils Absolute: 0.1 10*3/uL (ref 0.0–0.1)
Basophils Relative: 1 % (ref 0.0–3.0)
Eosinophils Absolute: 0.1 10*3/uL (ref 0.0–0.7)
Eosinophils Relative: 2.2 % (ref 0.0–5.0)
HCT: 37.7 % (ref 36.0–46.0)
Hemoglobin: 12.8 g/dL (ref 12.0–15.0)
Lymphocytes Relative: 30.2 % (ref 12.0–46.0)
Lymphs Abs: 1.6 10*3/uL (ref 0.7–4.0)
MCHC: 33.9 g/dL (ref 30.0–36.0)
MCV: 92.6 fL (ref 78.0–100.0)
Monocytes Absolute: 0.5 10*3/uL (ref 0.1–1.0)
Monocytes Relative: 10.1 % (ref 3.0–12.0)
Neutro Abs: 2.9 10*3/uL (ref 1.4–7.7)
Neutrophils Relative %: 56.5 % (ref 43.0–77.0)
Platelets: 333 10*3/uL (ref 150.0–400.0)
RBC: 4.08 Mil/uL (ref 3.87–5.11)
RDW: 13 % (ref 11.5–15.5)
WBC: 5.2 10*3/uL (ref 4.0–10.5)

## 2023-02-01 MED ORDER — LACTULOSE 10 GM/15ML PO SOLN
10.0000 g | Freq: Every day | ORAL | 0 refills | Status: DC | PRN
  Filled 2023-02-01: qty 105, 7d supply, fill #0

## 2023-02-01 NOTE — Assessment & Plan Note (Addendum)
Persistent issue with constipation.  Have discussed magnesium, benefiber, use of suppositories, etc. Recommended trial of trulance. Recommend f/u colonoscopy 09/2026. Persistent problems with constipation. Trulance did not help. Request trial of lactulose to have if needed. Taking two stool softeners daily. Rx for lactulose sent in. Will keep me updated.

## 2023-02-01 NOTE — Assessment & Plan Note (Signed)
Persistent back pain.  Trigger point injections did not help. Has f/u today.  Limiting her activity. If has been sitting and then stands - increased pain. Is some better once moving. Bending forward aggravates. Voltaren helps.

## 2023-02-01 NOTE — Assessment & Plan Note (Signed)
Continue vitamin D supplements as discussed.

## 2023-02-01 NOTE — Assessment & Plan Note (Signed)
History of migraine headaches as outlined. Sees neurology.  Magnesium glycinate. . Follow.

## 2023-02-02 ENCOUNTER — Other Ambulatory Visit: Payer: Self-pay

## 2023-02-02 ENCOUNTER — Encounter: Payer: Self-pay | Admitting: Internal Medicine

## 2023-02-12 ENCOUNTER — Other Ambulatory Visit: Payer: Self-pay

## 2023-03-02 DIAGNOSIS — H93292 Other abnormal auditory perceptions, left ear: Secondary | ICD-10-CM | POA: Diagnosis not present

## 2023-03-02 DIAGNOSIS — H93299 Other abnormal auditory perceptions, unspecified ear: Secondary | ICD-10-CM | POA: Diagnosis not present

## 2023-03-02 DIAGNOSIS — T7840XA Allergy, unspecified, initial encounter: Secondary | ICD-10-CM | POA: Diagnosis not present

## 2023-03-03 ENCOUNTER — Other Ambulatory Visit: Payer: Self-pay

## 2023-03-05 DIAGNOSIS — J305 Allergic rhinitis due to food: Secondary | ICD-10-CM | POA: Diagnosis not present

## 2023-03-16 ENCOUNTER — Other Ambulatory Visit: Payer: Self-pay

## 2023-03-17 ENCOUNTER — Other Ambulatory Visit: Payer: Self-pay

## 2023-03-18 ENCOUNTER — Encounter: Payer: Self-pay | Admitting: Internal Medicine

## 2023-03-18 DIAGNOSIS — K5900 Constipation, unspecified: Secondary | ICD-10-CM

## 2023-03-18 NOTE — Telephone Encounter (Signed)
 Order placed for [facility] referral to [facility]. Pt notified via my chart.

## 2023-04-13 ENCOUNTER — Other Ambulatory Visit: Payer: Self-pay

## 2023-04-13 DIAGNOSIS — K5900 Constipation, unspecified: Secondary | ICD-10-CM | POA: Diagnosis not present

## 2023-04-13 MED ORDER — PRUCALOPRIDE SUCCINATE 2 MG PO TABS
1.0000 | ORAL_TABLET | Freq: Every day | ORAL | 11 refills | Status: DC
  Filled 2023-04-13: qty 30, 30d supply, fill #0
  Filled 2023-05-10 – 2023-07-14 (×7): qty 30, 30d supply, fill #1

## 2023-04-13 NOTE — Progress Notes (Signed)
 Gastroenterology Return Visit Note   Initial Referring Provider:  Glendia Allena Flight, MD [address] [facility] [address] [facility] [place],  [place] [address]  Primary Care Provider:  Glendia Allena Flight, MD   CHIEF COMPLAINT: Chronic constipation, worsening since 20's.  HISTORY OF PRESENT ILLNESS:  History of Present Illness Monica Kaiser is a 48 year old female who presents with worsening abdominal bloating and constipation. She was referred by Doctor Glendia for evaluation of her chronic constipation.  She has a lifelong history of constipation, which has worsened over the past two years. Symptoms include severe abdominal bloating and constipation, with significant abdominal distension. She experiences hard stools and can go weeks without a bowel movement, with the longest duration being sixteen days. On average, she has a bowel movement every six to seven days, but the amount is often small despite the long interval.  She has tried various over-the-counter treatments including MiraLAX, milk of magnesia, Colace, and suppositories, with limited success. Lactulose  was prescribed but did not work as well as expected. She has also tried Linzess  and Trulance , both of which did not yield significant results and sometimes caused bloating without a bowel movement. She was on the maximum dose of Linzess  (290 mcg).  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 Symptoms are worse during workdays, and she feels slightly better on her days off, although she still does not have a full bowel movement.  She has a family history of colon cancer, as her father had the condition, which prompted her to have a colonoscopy at the age of 18. The colonoscopy did not reveal any polyps. She has not been on any long-term medication for her condition until recently.  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 She has two children, with her last childbirth being nineteen years ago. She had a tear during her first delivery but no significant complications post-delivery.  No use of fingers to evacuate stool, but she uses glycerin suppositories weekly. Sometimes the suppositories do not result in a bowel movement. She experiences bloating throughout the day, going to bed and waking up bloated, and describes her stools as small balls or hard, not well-formed.    Problem List Items Addressed This Visit       Other   Constipation - Primary   Relevant Medications   prucalopride 2 mg Tab   Other Relevant Orders   XR Sitz Marker Study    CURRENT MEDICATIONS:  Current Outpatient Medications: .  ascorbic acid, vitamin C, 500 mg/4 gram (1 teaspooful) Powd, Take by mouth., Disp: , Rfl: .  cholecalciferol, vitamin D3, (CHOLECALCIFEROL, VIT D3,,BULK,) 100,000 unit/gram Powd, Take by mouth., Disp: , Rfl: .  diclofenac  (VOLTAREN ) 50 MG EC tablet, Take 1 tablet (50 mg total) by mouth., Disp: , Rfl: .  diclofenac  sodium (VOLTAREN ) 1 % gel, Apply 4 g topically., Disp: , Rfl: .  norgestimate -ethinyl estradiol  0.25-35 mg-mcg per tablet, Take 1 tablet by mouth daily., Disp: , Rfl: .  ondansetron  (ZOFRAN -ODT) 4 MG disintegrating tablet, Take 1 tablet (4 mg total) by mouth., Disp: , Rfl: .  rimegepant (NURTEC ODT ) 75 mg TbDL, Take 1 tablet (75 mg total) by mouth., Disp: , Rfl: .  rimegepant (NURTEC ODT ) 75 mg TbDL, Take 1 tablet (75 mg total) by mouth daily as needed., Disp: , Rfl: .  prucalopride 2 mg Tab,  Take 1 tablet (2 mg total) by mouth daily., Disp: 30 tablet, Rfl: 11 (Not in a hospital admission)     VITAL SIGNS:  BP 120/80   Pulse 73   Temp 36.7 C (98.1 F) (Temporal)   Ht 167.6 cm (5' 6)   Wt 63.3 kg (139 lb 9.6 oz)   SpO2 97%   BMI 22.53 kg/m   PHYSICAL EXAM:  Constitutional:  Alert, oriented x 3, no acute distress, well nourished, and  well hydrated.  Mental Status:  Thought organized, appropriate affect, pleasantly interactive, not anxious appearing.           Abdomen: Soft, normal bowel sounds, non-distended, non-tender, no organomegaly or masses. Hypertympany noted throughout abdomen.   Perianal/Rectal Exam Not performed.   Extremities:  No edema, well perfused.              DIAGNOSTIC STUDIES:  I have reviewed all pertinent diagnostic studies, including:  Results RADIOLOGY CT scan:  Normal 2024  Pelvic US - 2024   DIAGNOSTIC Colonoscopy: No polyps (2024)    Laboratory results  CBC 10/2012 normal CMP 2024- normal    ASSESSMENT:  Assessment & Plan Chronic Constipation   She has a lifelong history of chronic constipation, worsening over the past two years, with symptoms of abdominal bloating, infrequent bowel movements every 6-7 days, and hard stools. A previous colonoscopy was normal, but there is a family history of colon cancer. Various treatments, including MiraLAX, milk of magnesia, lactulose , Linzess , and Trulance , have had no benefit in bowel habit, even with 290 mcg of Linzess .. Symptoms worsen during work shifts. Motegrity , a motility agent, will be prescribed to enhance colonic movement. Magnesium  oxide will aid in stool softening without significant bloating, and Senna will be used as a gentle stimulant every other day. Glycerin suppositories or enemas can provide immediate relief if needed. A colonic transit study will assess motility, and further evaluation of pelvic floor function may be necessary based on results. A Squatty Potty is recommended to facilitate bowel movements. An x-ray will be scheduled at the [facility] radiology facility following the colonic transit study. Dietary modifications and the use of Gas-X are advised for bloating management.         PLAN:  You have tried and failed Trulance  and Linzess  and a new prescription Motegrity  2 mg, once 1 a day. Sitz marker study- this  is a colonic transit study that will require you to take 5 sitz marker capsules, 1 a day, the same time each day for 5 days. On the 6th day you will need to come to a [facility] radiology facility for an abdominal x-ray. You will not be called to make an appointment for this x-ray. This  will need to be on a Monday through Friday between 8 am and 4 pm. You can start with 1-2 magnesium  500 mg caplets from [place], every night before bed or every day and then increase or decrease in dose as needed depending on stool texture, frequency and volume. You can take up to 2,000 mg (4 caplets) daily or more, if needed. Magnesium  is cumulative to itself, which means the longer you take it the softer and more frequent your stools may become. You may have to reduce the dose or reducing how often you take it (meaning skipping 1-2 days in between doses or taking it as needed, as long as the pattern is as consistent as possible to keep stools soft and evacuated without diarrhea or alternating between constipation and diarrhea).  Senna 8.6 mg tablet - 1-2 daily or every other day. You can titrate the dose to your stool texture, volume and frequency. Glycerin liquid suppositories to start to remove hard stools throughout the colon. Squatty potty 7 inch to help aid with evacuation. Gas-X can be scheduled throughout the day to reduce gas production. 3-4 month follow up.   *Will consider need for hydrogen breath testing at follow up visit if GI motility has improved, but abdominal bloating is persistent.    I personally spent 65 minutes face-to-face and non-face-to-face in the care of this patient, which includes all pre, intra, and post visit time on the date of service.   Of note, patient's labs and clinic notes will now be released automatically to their MyChart for review. Patients will most likely receive their test results prior to the provider's ability to be able to review them. Please give at least 2-3 business days, so  that I may have time to review the results, before expecting a response regarding the test results. This will be true for imaging studies, GI procedures, lab studies, etc. I appreciate your understanding and patience.   In regards to clinic notes, I will be as accurate as possible, but please understand that notes are generated through a Dragon dictation device and not all mistakes may be corrected before release.  Thank you for your understanding.  Edsel Graven MPAS, PA-C   This dictation was done with [facility] Naturally Speaking. Document was reviewed but inadvertent errors in transcription may be present

## 2023-04-19 DIAGNOSIS — K5900 Constipation, unspecified: Secondary | ICD-10-CM | POA: Diagnosis not present

## 2023-04-19 NOTE — Telephone Encounter (Signed)
 Called Monica Kaiser regarding her sitz marker results and we discussed taking her laxatives now and the need to continue with a bowel regimen to include 1,000 mg of magnesium , Motegrity  2 mg 1 po qam and starting senna tablets 1-2 if she has not had a bowel movement in 2 days.  She may use glycerin suppositories before attempting to have a bowel movement, as discussed in her clinic visit.  She will let me know how this goes. We will consider the need for testing with an anorectal manometry if further evaluation of the pelvic floor is required.  Monica Kaiser MPAS, PA-C [email or web address] Fax   [phone]  For test results, medication questions or other issues, nurse's line, Cristino Munroe, RN, [phone]   For questions regarding scheduling GI procedures, please call, [phone] (option 1 and then option 2 for scheduling GI procedures)  For questions regarding radiology appointments, or to schedule, [phone]  For clinic appointments, [phone].

## 2023-05-20 ENCOUNTER — Other Ambulatory Visit: Payer: Self-pay

## 2023-05-21 ENCOUNTER — Other Ambulatory Visit: Payer: Self-pay

## 2023-05-26 ENCOUNTER — Encounter: Payer: Self-pay | Admitting: Student in an Organized Health Care Education/Training Program

## 2023-05-26 ENCOUNTER — Other Ambulatory Visit: Payer: Self-pay | Admitting: Nurse Practitioner

## 2023-05-26 ENCOUNTER — Other Ambulatory Visit: Payer: Self-pay

## 2023-05-26 MED ORDER — TIZANIDINE HCL 4 MG PO CAPS
4.0000 mg | ORAL_CAPSULE | Freq: Three times a day (TID) | ORAL | 0 refills | Status: AC | PRN
  Filled 2023-05-26: qty 42, 14d supply, fill #0

## 2023-06-04 ENCOUNTER — Other Ambulatory Visit: Payer: Self-pay

## 2023-06-17 ENCOUNTER — Other Ambulatory Visit: Payer: Self-pay

## 2023-06-18 ENCOUNTER — Other Ambulatory Visit: Payer: Self-pay

## 2023-06-28 DIAGNOSIS — H524 Presbyopia: Secondary | ICD-10-CM | POA: Diagnosis not present

## 2023-07-01 ENCOUNTER — Other Ambulatory Visit: Payer: Self-pay

## 2023-07-02 ENCOUNTER — Other Ambulatory Visit: Payer: Self-pay

## 2023-07-13 ENCOUNTER — Other Ambulatory Visit: Payer: Self-pay

## 2023-07-14 ENCOUNTER — Other Ambulatory Visit: Payer: Self-pay

## 2023-07-20 ENCOUNTER — Ambulatory Visit: Attending: Cardiology | Admitting: Cardiology

## 2023-07-20 ENCOUNTER — Encounter: Payer: Self-pay | Admitting: Cardiology

## 2023-07-20 VITALS — BP 118/84 | HR 75 | Ht 66.0 in | Wt 140.6 lb

## 2023-07-20 DIAGNOSIS — R002 Palpitations: Secondary | ICD-10-CM

## 2023-07-20 DIAGNOSIS — Z79899 Other long term (current) drug therapy: Secondary | ICD-10-CM | POA: Diagnosis not present

## 2023-07-20 DIAGNOSIS — R079 Chest pain, unspecified: Secondary | ICD-10-CM

## 2023-07-20 NOTE — Progress Notes (Signed)
 Cardiology Office Note   Date:  07/20/2023  ID:  Korayma, Hagwood 17-Sep-1975, MRN [id number] PCP: Glendia Shad, MD  [facility] Providers Cardiologist:  Deatrice Cage, MD     History of Present Illness TORRI MICHALSKI is a 48 y.o. female with a past medical history of tachypalpitations, COVID infection (12/2018), migraines, anxiety, who presents today for follow-up.  She previously been evaluated in 2020 for tachypalpitations.  Heart monitor revealed sinus rhythm with an average heart rate of 86 bpm, isolated PACs and PVCs less than 1% burden.  Triggered events associated with tachycardia.  She was seen in November 2021 reporting an increase in tachypalpitations with chest discomfort.  Patient stated initial Zio patch was worn while she had COVID.  A repeat monitor was ordered. Heart monitor showed normal sinus rhythm with an average heart rate of 88 bpm, rare PACs and rare PVCs, triggered events did not correlate with arrhythmia.  She was last seen in clinic 07/2022 reported overall she was doing well.  She had noted Apple Watch and showed heart rate up to 190 bpm.  At this time as she was going to have dinner with her friend.  Heart rates going up to the 160s.  She was to repeat a heart monitor for another 2 weeks.   She returns clinic today overall doing well from a cardiac perspective.  She has noted that her Apple Watch showed elevated heart rates on a burden of 192.  And lowest of 44.  She states that over the last several weeks she has had a tightening or squeezing type pressure on her left chest that she primarily notes in the evening when she is sitting on the couch not particular when she is exerting herself.  She denies any other associated symptoms and has been trying to be active walking approximately 30 minutes/day.  She states that she does have a longstanding history of issues with GI and has been following up with a gastroenterologist.  Denies any hospitalizations or  visits to the emergency department.  ROS: 10 point review of system has been reviewed and considered negative with exception was been listed in the HPI  Studies Reviewed EKG Interpretation Date/Time:  Tuesday July 20 2023 15:42:42 EDT Ventricular Rate:  75 PR Interval:  164 QRS Duration:  74 QT Interval:  360 QTC Calculation: 402 R Axis:   17  Text Interpretation: Normal sinus rhythm Normal ECG No previous ECGs available Confirmed by Gerard Frederick ([id number]) on 07/20/2023 3:46:16 PM    Heart Monitor 09/01/2022 Patient had a min HR of 36 bpm, max HR of 193 bpm, and avg HR of 81 bpm. Predominant underlying rhythm was Sinus Rhythm. Second Degree AV Block-Mobitz I (Wenckebach) was present during at 1:36 AM presumably during sleep.   Rare PACs and rare PVCs.  2D echo 08/10/2022 1. Left ventricular ejection fraction, by estimation, is 55 to 60%. Left  ventricular ejection fraction by 3D volume is 56 %. Left ventricular  ejection fraction by PLAX is 56 %. The left ventricle has normal function.  The left ventricle has no regional  wall motion abnormalities. Left ventricular diastolic parameters were  normal.   2. Right ventricular systolic function is normal. The right ventricular  size is normal.   3. The mitral valve is normal in structure. Mild mitral valve  regurgitation.   4. The aortic valve is tricuspid. Aortic valve regurgitation is not  visualized.   5. The inferior vena cava is normal in  size with greater than 50%  respiratory variability, suggesting right atrial pressure of 3 mmHg.    Heart monitor 12/2019  Normal sinus rhythm with an average heart rate of 88 bpm. Rare PACs and rare PVCs. Triggered events did not correlate with arrhythmia. No significant arrhythmia.   Heart monitor 01/2019`  Normal sinus rhythm with an average heart rate of 86 bpm. Isolated PACs and PVCs with less than 1% burden. Most triggered events correlated with sinus tachycardia. Highest sinus  tachycardia rate was around 200 bpm. Risk Assessment/Calculations           Physical Exam VS:  BP 118/84   Pulse 75   Ht 5' 6 (1.676 m)   Wt 140 lb 9.6 oz (63.8 kg)   SpO2 98%   BMI 22.69 kg/m        Wt Readings from Last 3 Encounters:  07/20/23 140 lb 9.6 oz (63.8 kg)  02/01/23 136 lb 12.8 oz (62.1 kg)  01/04/23 136 lb (61.7 kg)    GEN: Well nourished, well developed in no acute distress NECK: No JVD; No carotid bruits CARDIAC: RRR, no murmurs, rubs, gallops RESPIRATORY:  Clear to auscultation without rales, wheezing or rhonchi  ABDOMEN: Soft, non-tender, non-distended EXTREMITIES:  No edema; No deformity   ASSESSMENT AND PLAN Palpitations where she has a longstanding tachybradycardia history dating back to 2020.  She has previously worn a ZIO monitor in 2021 revealing normal sinus rhythm with an average heart rate of 86 bpm isolated PACs PVCs less than 1% burden NIS tachycardia) to 200 bpm in January 2021.  Repeat monitor in December 25, 2019 showed normal sinus rhythm with average heart rate of 88 bpm, rare PACs and rare PVCs, and no significant arrhythmia was noted.  She continues to monitor her heart rates with her Apple Watch.  EKG today reveals sinus rhythm with a rate of 75 with no acute ischemic changes noted.  With continuing palpitations we will defer on repeating a ZIO monitor today but will send her for labs TSH/free T4, magnesium , BMP for electrolytes and a D-dimer.  Not thought to be a blood clot but she is 47 continued on birth control therapy.  Determining the results of blood work will determine if repeat ZIO monitor is ordered.  Atypical chest pain that is primarily in the evenings when she is at rest.  She has no prior cardiac history and no family history of cardiovascular disease.  She has been scheduled for coronary calcium scoring.        Dispo: Patient to return to clinic with MD/APP in 6 to 8 weeks or sooner if needed for reevaluation of  symptoms.  Signed, Gerell Fortson, NP

## 2023-07-20 NOTE — Patient Instructions (Signed)
 Medication Instructions:  Your physician recommends that you continue on your current medications as directed. Please refer to the Current Medication list given to you today.   *If you need a refill on your cardiac medications before your next appointment, please call your pharmacy*  Lab Work: Your provider would like for you to have following labs drawn today TSH, Free T4, Mag, BMP, D-Dimmer.   If you have labs (blood work) drawn today and your tests are completely normal, you will receive your results only by: MyChart Message (if you have MyChart) OR A paper copy in the mail If you have any lab test that is abnormal or we need to change your treatment, we will call you to review the results.  Testing/Procedures: Your physician has recommended that you have CT Coronary Calcium Score.  - $99 out of pocket cost at the time of your test - Call [phone] to schedule at your convenience.  [facility] [address] [place], [place] [address]   Coronary CalciumScan A coronary calcium scan is an imaging test used to look for deposits of calcium and other fatty materials (plaques) in the inner lining of the blood vessels of the heart (coronary arteries). These deposits of calcium and plaques can partly clog and narrow the coronary arteries without producing any symptoms or warning signs. This puts a person at risk for a heart attack. This test can detect these deposits before symptoms develop. Tell a health care provider about: Any allergies you have. All medicines you are taking, including vitamins, herbs, eye drops, creams, and over-the-counter medicines. Any problems you or family members have had with anesthetic medicines. Any blood disorders you have. Any surgeries you have had. Any medical conditions you have. Whether you are pregnant or may be pregnant. What are the risks? Generally, this is a safe procedure. However, problems may occur,  including: Harm to a pregnant woman and her unborn baby. This test involves the use of radiation. Radiation exposure can be dangerous to a pregnant woman and her unborn baby. If you are pregnant, you generally should not have this procedure done. Slight increase in the risk of cancer. This is because of the radiation involved in the test. What happens before the procedure? No preparation is needed for this procedure. What happens during the procedure? You will undress and remove any jewelry around your neck or chest. You will put on a hospital gown. Sticky electrodes will be placed on your chest. The electrodes will be connected to an electrocardiogram (ECG) machine to record a tracing of the electrical activity of your heart. A CT scanner will take pictures of your heart. During this time, you will be asked to lie still and hold your breath for 2-3 seconds while a picture of your heart is being taken. The procedure may vary among health care providers and hospitals. What happens after the procedure? You can get dressed. You can return to your normal activities. It is up to you to get the results of your test. Ask your health care provider, or the department that is doing the test, when your results will be ready. Summary A coronary calcium scan is an imaging test used to look for deposits of calcium and other fatty materials (plaques) in the inner lining of the blood vessels of the heart (coronary arteries). Generally, this is a safe procedure. Tell your health care provider if you are pregnant or may be pregnant. No preparation is needed for this  procedure. A CT scanner will take pictures of your heart. You can return to your normal activities after the scan is done. This information is not intended to replace advice given to you by your health care provider. Make sure you discuss any questions you have with your health care provider. Document Released: 06/20/2007 Document Revised: 11/11/2015  Document Reviewed: 11/11/2015 Elsevier Interactive Patient Education  2017 [employer].   Follow-Up: At [facility], you and your health needs are our priority.  As part of our continuing mission to provide you with exceptional heart care, our providers are all part of one team.  This team includes your primary Cardiologist (physician) and Advanced Practice Providers or APPs (Physician Assistants and Nurse Practitioners) who all work together to provide you with the care you need, when you need it.  Your next appointment:   6 - 8 week(s)  Provider:   Deatrice Cage, MD or Tylene Lunch, NP

## 2023-07-21 ENCOUNTER — Ambulatory Visit: Payer: Self-pay | Admitting: Cardiology

## 2023-07-21 ENCOUNTER — Other Ambulatory Visit: Payer: Self-pay

## 2023-07-21 ENCOUNTER — Ambulatory Visit
Admission: RE | Admit: 2023-07-21 | Discharge: 2023-07-21 | Disposition: A | Discharge status: Home / Self Care | Source: Ambulatory Visit | Attending: Cardiology | Admitting: Cardiology

## 2023-07-21 DIAGNOSIS — R079 Chest pain, unspecified: Secondary | ICD-10-CM

## 2023-07-21 DIAGNOSIS — R000 Tachycardia, unspecified: Secondary | ICD-10-CM | POA: Diagnosis not present

## 2023-07-21 DIAGNOSIS — R7989 Other specified abnormal findings of blood chemistry: Secondary | ICD-10-CM | POA: Insufficient documentation

## 2023-07-21 DIAGNOSIS — R0789 Other chest pain: Secondary | ICD-10-CM | POA: Diagnosis not present

## 2023-07-21 LAB — BASIC METABOLIC PANEL WITH GFR
BUN/Creatinine Ratio: 17 (ref 9–23)
BUN: 11 mg/dL (ref 6–24)
CO2: 18 mmol/L — ABNORMAL LOW (ref 20–29)
Calcium: 9.7 mg/dL (ref 8.7–10.2)
Chloride: 101 mmol/L (ref 96–106)
Creatinine, Ser: 0.63 mg/dL (ref 0.57–1.00)
Glucose: 112 mg/dL — ABNORMAL HIGH (ref 70–99)
Potassium: 4.5 mmol/L (ref 3.5–5.2)
Sodium: 140 mmol/L (ref 134–144)
eGFR: 111 mL/min/1.73 (ref >59)

## 2023-07-21 LAB — T4, FREE: Free T4: 1.06 ng/dL (ref 0.82–1.77)

## 2023-07-21 LAB — D-DIMER, QUANTITATIVE: D-DIMER: 1.56 mg{FEU}/L — ABNORMAL HIGH (ref 0.00–0.49)

## 2023-07-21 LAB — TSH: TSH: 0.822 u[IU]/mL (ref 0.450–4.500)

## 2023-07-21 LAB — MAGNESIUM: Magnesium: 2.2 mg/dL (ref 1.6–2.3)

## 2023-07-21 MED ORDER — IOHEXOL 350 MG/ML SOLN
75.0000 mL | Freq: Once | INTRAVENOUS | Status: AC | PRN
  Administered 2023-07-21: 75 mL via INTRAVENOUS

## 2023-07-21 MED ORDER — METOPROLOL TARTRATE 100 MG PO TABS
100.0000 mg | ORAL_TABLET | Freq: Once | ORAL | 0 refills | Status: DC
  Filled 2023-07-21: qty 1, 1d supply, fill #0

## 2023-07-21 NOTE — Progress Notes (Signed)
 CT of the chest negative for pulmonary embolism.

## 2023-07-21 NOTE — Progress Notes (Signed)
 Kidney function electrolytes are stable.  Thyroid  is within normal limits.  Magnesium  is stable.  Elevated D-dimer.  Recommend CTA of the chest PE protocol to rule out pulmonary embolism.

## 2023-07-22 NOTE — Telephone Encounter (Signed)
 The results were mildly positive for the d-dimer which can be related to several different factors. Monica Kaiser was correct in his response but it can also be from immobilization, infection, smoking, and even arthritis. With a negative scan it is reassuring. I would continue with the coronary calcium scoring, there is no IVP dye involved in doing that test. There is nothing that you did to increase the result. The other labs you had were stable as well. Continue to monitor you heart rate with your watch and have the testing completed as we will discuss further at your return appointment. If you have any other questions please let us  know. Thanks,  NIKE

## 2023-07-29 ENCOUNTER — Other Ambulatory Visit: Payer: Self-pay

## 2023-07-29 DIAGNOSIS — K5904 Chronic idiopathic constipation: Secondary | ICD-10-CM | POA: Diagnosis not present

## 2023-07-29 MED ORDER — TRULANCE 3 MG PO TABS
3.0000 mg | ORAL_TABLET | Freq: Every day | ORAL | 11 refills | Status: DC
  Filled 2023-07-29 – 2023-08-10 (×2): qty 30, 30d supply, fill #0
  Filled 2023-09-06 – 2023-11-09 (×6): qty 30, 30d supply, fill #1

## 2023-08-03 ENCOUNTER — Ambulatory Visit: Admitting: Physician Assistant

## 2023-08-09 ENCOUNTER — Other Ambulatory Visit: Payer: Self-pay

## 2023-08-09 ENCOUNTER — Encounter: Payer: Commercial Managed Care - PPO | Admitting: Internal Medicine

## 2023-08-10 ENCOUNTER — Other Ambulatory Visit: Payer: Self-pay

## 2023-08-13 ENCOUNTER — Ambulatory Visit: Admitting: Internal Medicine

## 2023-08-13 ENCOUNTER — Encounter: Payer: Self-pay | Admitting: Internal Medicine

## 2023-08-13 ENCOUNTER — Other Ambulatory Visit: Payer: Self-pay

## 2023-08-13 VITALS — BP 100/70 | HR 82 | Ht 66.0 in | Wt 132.8 lb

## 2023-08-13 DIAGNOSIS — E559 Vitamin D deficiency, unspecified: Secondary | ICD-10-CM

## 2023-08-13 DIAGNOSIS — Z0000 Encounter for general adult medical examination without abnormal findings: Secondary | ICD-10-CM | POA: Diagnosis not present

## 2023-08-13 DIAGNOSIS — E538 Deficiency of other specified B group vitamins: Secondary | ICD-10-CM

## 2023-08-13 DIAGNOSIS — Z1231 Encounter for screening mammogram for malignant neoplasm of breast: Secondary | ICD-10-CM

## 2023-08-13 DIAGNOSIS — R002 Palpitations: Secondary | ICD-10-CM | POA: Diagnosis not present

## 2023-08-13 DIAGNOSIS — K5909 Other constipation: Secondary | ICD-10-CM | POA: Diagnosis not present

## 2023-08-13 LAB — CBC WITH DIFFERENTIAL/PLATELET
Basophils Absolute: 0.1 K/uL (ref 0.0–0.1)
Basophils Relative: 1.2 % (ref 0.0–3.0)
Eosinophils Absolute: 0.1 K/uL (ref 0.0–0.7)
Eosinophils Relative: 1.7 % (ref 0.0–5.0)
HCT: 39.1 % (ref 36.0–46.0)
Hemoglobin: 13.3 g/dL (ref 12.0–15.0)
Lymphocytes Relative: 27.5 % (ref 12.0–46.0)
Lymphs Abs: 1.5 K/uL (ref 0.7–4.0)
MCHC: 34 g/dL (ref 30.0–36.0)
MCV: 92.4 fl (ref 78.0–100.0)
Monocytes Absolute: 0.5 K/uL (ref 0.1–1.0)
Monocytes Relative: 8.8 % (ref 3.0–12.0)
Neutro Abs: 3.3 K/uL (ref 1.4–7.7)
Neutrophils Relative %: 60.8 % (ref 43.0–77.0)
Platelets: 305 K/uL (ref 150.0–400.0)
RBC: 4.24 Mil/uL (ref 3.87–5.11)
RDW: 12.9 % (ref 11.5–15.5)
WBC: 5.5 K/uL (ref 4.0–10.5)

## 2023-08-13 LAB — VITAMIN D 25 HYDROXY (VIT D DEFICIENCY, FRACTURES): VITD: 49.08 ng/mL (ref 30.00–100.00)

## 2023-08-13 LAB — BASIC METABOLIC PANEL WITH GFR
BUN: 10 mg/dL (ref 6–23)
CO2: 27 meq/L (ref 19–32)
Calcium: 8.9 mg/dL (ref 8.4–10.5)
Chloride: 103 meq/L (ref 96–112)
Creatinine, Ser: 0.51 mg/dL (ref 0.40–1.20)
GFR: 111.51 mL/min (ref >60.00)
Glucose, Bld: 92 mg/dL (ref 70–99)
Potassium: 5.1 meq/L (ref 3.5–5.1)
Sodium: 138 meq/L (ref 135–145)

## 2023-08-13 LAB — HEPATIC FUNCTION PANEL
ALT: 16 U/L (ref 0–35)
AST: 16 U/L (ref 0–37)
Albumin: 4.1 g/dL (ref 3.5–5.2)
Alkaline Phosphatase: 41 U/L (ref 39–117)
Bilirubin, Direct: 0.1 mg/dL (ref 0.0–0.3)
Total Bilirubin: 0.5 mg/dL (ref 0.2–1.2)
Total Protein: 6.8 g/dL (ref 6.0–8.3)

## 2023-08-13 LAB — LIPID PANEL
Cholesterol: 175 mg/dL (ref 0–200)
HDL: 52 mg/dL (ref >39.00)
LDL Cholesterol: 104 mg/dL — ABNORMAL HIGH (ref 0–99)
NonHDL: 123.08
Total CHOL/HDL Ratio: 3
Triglycerides: 97 mg/dL (ref 0.0–149.0)
VLDL: 19.4 mg/dL (ref 0.0–40.0)

## 2023-08-13 LAB — TSH: TSH: 0.65 u[IU]/mL (ref 0.35–5.50)

## 2023-08-13 LAB — VITAMIN B12: Vitamin B-12: 652 pg/mL (ref 211–911)

## 2023-08-13 MED ORDER — NORGESTIMATE-ETH ESTRADIOL 0.25-35 MG-MCG PO TABS
1.0000 | ORAL_TABLET | Freq: Every day | ORAL | 0 refills | Status: DC
  Filled 2023-08-13 – 2023-08-18 (×2): qty 84, 84d supply, fill #0

## 2023-08-13 NOTE — Progress Notes (Signed)
 Subjective:    Patient ID: Monica Kaiser, female    DOB: 1975-08-04, 48 y.o.   MRN: [id number]  Patient here for  Chief Complaint  Patient presents with   Annual Exam    Physical     HPI Here for a physical exam. Saw GI 11/04/22. Had tried linzess  previously. Recommended trial of trulance . Recommend f/u colonoscopy 09/2026. Persistent problems with constipation. Trulance  did not help.  Had f/u 04/13/23 - recommended motegrity . Also = sitz marker study. Per note, also recommended senna, magnesium , glycerin liquid suppositories and squatty potty. Follow up 07/29/23 - recommended retry trulance  and senna. Had f/u with neurology 12/14/22 - f/u chronic migraine. Recommended zofran  prn and nurtec prn. Had f/u with cardiology 07/20/23 - f/u palpitations. Recommended f/u labs and coronary calcium scoring. D-dimer elevated. Recommended CTA. CT chest - negative for PE. She is trying to stay active. Waling - weighted vest. No increased chest pain or sob reported. GI issues - per above.    Past Medical History:  Diagnosis Date   Anxiety    Fatigue    Low back pain    Migraine    Miscarriage    Past Surgical History:  Procedure Laterality Date   BREAST CYST EXCISION Right 2003   benign    COLONOSCOPY  2004   Family History  Problem Relation Age of Onset   Lung cancer Father 73   Colon cancer Father 72   Cancer Father        lung and colon cancer age 40   Arthritis Mother    Diabetes Mother    Hyperlipidemia Mother    Hypertension Mother    Breast cancer Neg Hx    Social History   Socioeconomic History   Marital status: Married    Spouse name: Not on file   Number of children: 2   Years of education: Not on file   Highest education level: Not on file  Occupational History   Not on file  Tobacco Use   Smoking status: Never   Smokeless tobacco: Never  Vaping Use   Vaping status: Never Used  Substance and Sexual Activity   Alcohol use: No   Drug use: No   Sexual activity: Yes   Other Topics Concern   Not on file  Social History Narrative   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 [place]   2 kids    Married    No guns   Wears seat belt    Social Drivers of Corporate investment banker Strain: Not on file  Food Insecurity: No Food Insecurity (09/25/2022)   Received [facility]   Hunger Vital Sign    Within the past 12 months, you worried that your food would run out before you got the money to buy more.: Never true    Within the past 12 months, the food you bought just didn't last and you didn't have money to get more.: Never true  Transportation Needs: No Transportation Needs (09/25/2022)   Received from [facility] - Transportation    In the past 12 months, has lack of transportation kept you from medical appointments or from getting medications?: No    Lack of Transportation (Non-Medical): No  Physical Activity: Not on file  Stress: Not on file  Social Connections: Not on file     Review of Systems  Constitutional:  Negative for appetite  change and unexpected weight change.  HENT:  Negative for congestion, sinus pressure and sore throat.   Eyes:  Negative for pain and visual disturbance.  Respiratory:  Negative for cough, chest tightness and shortness of breath.   Cardiovascular:  Negative for chest pain, palpitations and leg swelling.  Gastrointestinal:  Negative for vomiting.       GI issues as outlined.   Genitourinary:  Negative for difficulty urinating and dysuria.  Musculoskeletal:  Negative for joint swelling and myalgias.  Skin:  Negative for color change and rash.  Neurological:  Negative for dizziness and headaches.  Hematological:  Negative for adenopathy. Does not bruise/bleed easily.  Psychiatric/Behavioral:  Negative for agitation and dysphoric mood.        Increased stress related to recent evaluation and testing.        Objective:     BP 100/70   Pulse 82    Ht 5' 6 (1.676 m)   Wt 132 lb 12.8 oz (60.2 kg)   SpO2 97%   BMI 21.43 kg/m  Wt Readings from Last 3 Encounters:  08/13/23 132 lb 12.8 oz (60.2 kg)  07/20/23 140 lb 9.6 oz (63.8 kg)  02/01/23 136 lb 12.8 oz (62.1 kg)    Physical Exam Vitals reviewed.  Constitutional:      General: She is not in acute distress.    Appearance: Normal appearance. She is well-developed.  HENT:     Head: Normocephalic and atraumatic.     Right Ear: External ear normal.     Left Ear: External ear normal.     Mouth/Throat:     Pharynx: No oropharyngeal exudate or posterior oropharyngeal erythema.  Eyes:     General: No scleral icterus.       Right eye: No discharge.        Left eye: No discharge.     Conjunctiva/sclera: Conjunctivae normal.  Neck:     Thyroid : No thyromegaly.  Cardiovascular:     Rate and Rhythm: Normal rate and regular rhythm.  Pulmonary:     Effort: No tachypnea, accessory muscle usage or respiratory distress.     Breath sounds: Normal breath sounds. No decreased breath sounds or wheezing.  Chest:  Breasts:    Right: No inverted nipple, mass, nipple discharge or tenderness (no axillary adenopathy).     Left: No inverted nipple, mass, nipple discharge or tenderness (no axilarry adenopathy).  Abdominal:     General: Bowel sounds are normal.     Palpations: Abdomen is soft.     Tenderness: There is no abdominal tenderness.  Musculoskeletal:        General: No swelling or tenderness.     Cervical back: Neck supple.  Lymphadenopathy:     Cervical: No cervical adenopathy.  Skin:    Findings: No erythema or rash.  Neurological:     Mental Status: She is alert and oriented to person, place, and time.  Psychiatric:        Mood and Affect: Mood normal.        Behavior: Behavior normal.         Outpatient Encounter Medications as of 08/13/2023  Medication Sig   Ascorbic Acid (VITAMIN C PO) Take by mouth. (Patient taking differently: Take 1 capsule by mouth daily.)    MAGNESIUM  GLYCINATE PO Take 2 tablets by mouth at bedtime.   ondansetron  (ZOFRAN -ODT) 4 MG disintegrating tablet Take 1 tablet (4 mg total) by mouth every 8 (eight) hours as needed for nausea.  Plecanatide  (TRULANCE ) 3 MG TABS Take 1 tablet (3 mg total) by mouth daily.   Riboflavin (B2 PO) Take by mouth.   Rimegepant Sulfate  (NURTEC) 75 MG TBDP Take 1 tablet (75 mg total) by mouth daily as needed for rescue treatment of migraine.   Turmeric (QC TUMERIC COMPLEX) 500 MG CAPS Take 500 mg by mouth daily.   VITAMIN D -VITAMIN K PO Take 1 capsule by mouth daily at 6 (six) AM.   [DISCONTINUED] norgestimate -ethinyl estradiol  (ORTHO-CYCLEN) 0.25-35 MG-MCG tablet Take 1 tablet by mouth once daily   [DISCONTINUED] VITAMIN D  PO Take by mouth. (Patient taking differently: Take by mouth daily. Vitamin D  plus K2)   [DISCONTINUED] lactulose  (CHRONULAC ) 10 GM/15ML solution Take 15 mLs (10 g total) by mouth daily as needed for mild constipation. (Patient not taking: Reported on 08/13/2023)   No facility-administered encounter medications on file as of 08/13/2023.     Lab Results  Component Value Date   WBC 5.5 08/13/2023   HGB 13.3 08/13/2023   HCT 39.1 08/13/2023   PLT 305.0 08/13/2023   GLUCOSE 92 08/13/2023   CHOL 175 08/13/2023   TRIG 97.0 08/13/2023   HDL 52.00 08/13/2023   LDLCALC 104 (H) 08/13/2023   ALT 16 08/13/2023   AST 16 08/13/2023   NA 138 08/13/2023   K 5.1 08/13/2023   CL 103 08/13/2023   CREATININE 0.51 08/13/2023   BUN 10 08/13/2023   CO2 27 08/13/2023   TSH 0.65 08/13/2023   HGBA1C 5.5 10/05/2018    CT Angio Chest Pulmonary Embolism (PE) W or WO Contrast Result Date: 07/21/2023 CLINICAL DATA:  Elevated D-dimer, chest tightness, tachycardia EXAM: CT ANGIOGRAPHY CHEST WITH CONTRAST TECHNIQUE: Multidetector CT imaging of the chest was performed using the standard protocol during bolus administration of intravenous contrast. Multiplanar CT image reconstructions and MIPs were obtained  to evaluate the vascular anatomy. RADIATION DOSE REDUCTION: This exam was performed according to the departmental dose-optimization program which includes automated exposure control, adjustment of the mA and/or kV according to patient size and/or use of iterative reconstruction technique. CONTRAST:  75mL OMNIPAQUE  IOHEXOL  350 MG/ML SOLN COMPARISON:  None Available. FINDINGS: Cardiovascular: Satisfactory opacification of the pulmonary arteries to the segmental level. No evidence of pulmonary embolism. Normal heart size. No pericardial effusion. Mediastinum/Nodes: No enlarged mediastinal, hilar, or axillary lymph nodes. Thyroid  gland, trachea, and esophagus demonstrate no significant findings. Lungs/Pleura: Lungs are clear. No pleural effusion or pneumothorax. Upper Abdomen: No acute abnormality. Musculoskeletal: No chest wall abnormality. No acute osseous findings. Review of the MIP images confirms the above findings. IMPRESSION: Negative examination for pulmonary embolism. Electronically Signed   By: Marolyn JONETTA Jaksch M.D.   On: 07/21/2023 15:53       Assessment & Plan:  Routine general medical examination at a health care facility  Annual physical exam  Encounter for screening mammogram for malignant neoplasm of breast -     3D Screening Mammogram, Left and Right; Future  Health care maintenance Assessment & Plan: Physical today 08/13/23. Mammogram 09/14/22 - Birads I.  Schedule f/u screening mammogram. Colonoscopy 09/2021 - normal. Recommended f/u in 5 years.  PAP - gyn - 09/2021 - normal.    Low serum vitamin B12 Assessment & Plan: Check B12 level today.   Orders: -     Vitamin B12  Vitamin D  deficiency -     VITAMIN D  25 Hydroxy (Vit-D Deficiency, Fractures) -     TSH  Palpitations Assessment & Plan: Had f/u with cardiology 07/20/23 - f/u  palpitations. Recommended f/u labs and coronary calcium scoring. D-dimer elevated. Recommended CTA. CT chest - negative for PE. Discussed elevated d-dimer.  She is trying to stay active. Waling - weighted vest. No increased chest pain or sob reported. Has f/u planned with cardiology this month. Check tsh and metabolic panel. Overall stable. No leg swelling or pain.   Orders: -     Basic metabolic panel with GFR -     Hepatic function panel -     Lipid panel -     CBC with Differential/Platelet  Chronic constipation Assessment & Plan: Persistent issue with constipation.  Have discussed magnesium , benefiber, use of suppositories, etc. Recommended trial of trulance . Recommend f/u colonoscopy 09/2026. Persistent problems with constipation. Follow up 07/29/23 - recommended retry trulance  and senna. Continue f/u with GI.       Allena Hamilton, MD

## 2023-08-13 NOTE — Assessment & Plan Note (Addendum)
 Physical today 08/13/23. Mammogram 09/14/22 - Birads I.  Schedule f/u screening mammogram. Colonoscopy 09/2021 - normal. Recommended f/u in 5 years.  PAP - gyn - 09/2021 - normal.

## 2023-08-13 NOTE — Patient Instructions (Addendum)
 YOUR MAMMOGRAM IS DUE in September, PLEASE CALL AND GET THIS SCHEDULED! [facility] - call [phone]

## 2023-08-14 ENCOUNTER — Ambulatory Visit: Payer: Self-pay | Admitting: Internal Medicine

## 2023-08-14 DIAGNOSIS — E875 Hyperkalemia: Secondary | ICD-10-CM

## 2023-08-18 ENCOUNTER — Other Ambulatory Visit: Payer: Self-pay

## 2023-08-22 ENCOUNTER — Encounter: Payer: Self-pay | Admitting: Internal Medicine

## 2023-08-22 NOTE — Assessment & Plan Note (Signed)
Check B12 level today.  

## 2023-08-22 NOTE — Assessment & Plan Note (Signed)
 Had f/u with cardiology 07/20/23 - f/u palpitations. Recommended f/u labs and coronary calcium scoring. D-dimer elevated. Recommended CTA. CT chest - negative for PE. Discussed elevated d-dimer. She is trying to stay active. Waling - weighted vest. No increased chest pain or sob reported. Has f/u planned with cardiology this month. Check tsh and metabolic panel. Overall stable. No leg swelling or pain.

## 2023-08-22 NOTE — Assessment & Plan Note (Signed)
 Persistent issue with constipation.  Have discussed magnesium , benefiber, use of suppositories, etc. Recommended trial of trulance . Recommend f/u colonoscopy 09/2026. Persistent problems with constipation. Follow up 07/29/23 - recommended retry trulance  and senna. Continue f/u with GI.

## 2023-08-24 ENCOUNTER — Ambulatory Visit
Admission: RE | Admit: 2023-08-24 | Discharge: 2023-08-24 | Disposition: A | Discharge status: Home / Self Care | Payer: Self-pay | Source: Ambulatory Visit | Attending: Cardiology | Admitting: Cardiology

## 2023-08-24 ENCOUNTER — Other Ambulatory Visit
Admission: RE | Admit: 2023-08-24 | Discharge: 2023-08-24 | Disposition: A | Discharge status: Home / Self Care | Payer: Self-pay | Source: Ambulatory Visit | Attending: Cardiology | Admitting: Cardiology

## 2023-08-24 DIAGNOSIS — E875 Hyperkalemia: Secondary | ICD-10-CM | POA: Insufficient documentation

## 2023-08-24 DIAGNOSIS — R079 Chest pain, unspecified: Secondary | ICD-10-CM | POA: Insufficient documentation

## 2023-08-24 LAB — POTASSIUM: Potassium: 4 mmol/L (ref 3.5–5.1)

## 2023-08-24 NOTE — Progress Notes (Signed)
 Coronary calcium score of 0.  No coronary artery disease noted on scan.  Continue heart healthy lifestyle and risk factor modification.  Overall very reassuring study.

## 2023-08-25 ENCOUNTER — Ambulatory Visit: Payer: Self-pay | Admitting: Internal Medicine

## 2023-09-02 ENCOUNTER — Ambulatory Visit: Admitting: Cardiology

## 2023-09-17 ENCOUNTER — Other Ambulatory Visit: Payer: Self-pay

## 2023-09-20 ENCOUNTER — Other Ambulatory Visit: Payer: Self-pay

## 2023-09-20 DIAGNOSIS — Z01419 Encounter for gynecological examination (general) (routine) without abnormal findings: Secondary | ICD-10-CM | POA: Diagnosis not present

## 2023-09-20 DIAGNOSIS — Z1151 Encounter for screening for human papillomavirus (HPV): Secondary | ICD-10-CM | POA: Diagnosis not present

## 2023-09-20 DIAGNOSIS — Z124 Encounter for screening for malignant neoplasm of cervix: Secondary | ICD-10-CM | POA: Diagnosis not present

## 2023-09-20 DIAGNOSIS — Z6822 Body mass index (BMI) 22.0-22.9, adult: Secondary | ICD-10-CM | POA: Diagnosis not present

## 2023-09-20 DIAGNOSIS — N760 Acute vaginitis: Secondary | ICD-10-CM | POA: Diagnosis not present

## 2023-09-20 MED ORDER — NORGESTIMATE-ETH ESTRADIOL 0.25-35 MG-MCG PO TABS
1.0000 | ORAL_TABLET | Freq: Every day | ORAL | 3 refills | Status: AC
  Filled 2023-09-20 – 2023-11-21 (×2): qty 84, 84d supply, fill #0
  Filled 2024-01-26 – 2024-01-30 (×3): qty 84, 84d supply, fill #1

## 2023-09-27 ENCOUNTER — Encounter

## 2023-09-28 ENCOUNTER — Encounter

## 2023-09-30 ENCOUNTER — Other Ambulatory Visit: Payer: Self-pay

## 2023-10-05 ENCOUNTER — Ambulatory Visit
Admission: RE | Admit: 2023-10-05 | Discharge: 2023-10-05 | Disposition: A | Discharge status: Home / Self Care | Source: Ambulatory Visit | Attending: Internal Medicine | Admitting: Internal Medicine

## 2023-10-05 DIAGNOSIS — Z1231 Encounter for screening mammogram for malignant neoplasm of breast: Secondary | ICD-10-CM | POA: Diagnosis not present

## 2023-10-12 ENCOUNTER — Other Ambulatory Visit: Payer: Self-pay

## 2023-10-25 ENCOUNTER — Other Ambulatory Visit: Payer: Self-pay

## 2023-10-25 ENCOUNTER — Ambulatory Visit

## 2023-10-25 DIAGNOSIS — W908XXA Exposure to other nonionizing radiation, initial encounter: Secondary | ICD-10-CM | POA: Diagnosis not present

## 2023-10-25 DIAGNOSIS — Z1283 Encounter for screening for malignant neoplasm of skin: Secondary | ICD-10-CM

## 2023-10-25 DIAGNOSIS — L814 Other melanin hyperpigmentation: Secondary | ICD-10-CM | POA: Diagnosis not present

## 2023-10-25 DIAGNOSIS — L578 Other skin changes due to chronic exposure to nonionizing radiation: Secondary | ICD-10-CM

## 2023-10-25 DIAGNOSIS — D229 Melanocytic nevi, unspecified: Secondary | ICD-10-CM

## 2023-10-25 DIAGNOSIS — D1801 Hemangioma of skin and subcutaneous tissue: Secondary | ICD-10-CM

## 2023-10-25 NOTE — Patient Instructions (Signed)

## 2023-10-25 NOTE — Progress Notes (Signed)
    Subjective   Monica Kaiser is a 48 y.o. female who presents for the following: Total body skin exam for skin cancer screening and mole check. The patient has spots, moles and lesions to be evaluated, some may be new or changing and the patient may have concern these could be cancer.. Patient is new patient  Today patient reports: Area of concern on her face Area of concern on the left trunk  Review of Systems:    No other skin or systemic complaints except as noted in HPI or Assessment and Plan.  The following portions of the chart were reviewed this encounter and updated as appropriate: medications, allergies, medical history  Relevant Medical History:  n/a   Objective  Well appearing patient in no apparent distress; mood and affect are within normal limits. Examination was performed of the: Full Skin Examination: scalp, head, eyes, ears, nose, lips, neck, chest, axillae, abdomen, back, buttocks, bilateral upper extremities, bilateral lower extremities, hands, feet, fingers, toes, fingernails, and toenails.   Examination notable for: SKIN EXAM, Angioma(s): Scattered red vascular papule(s)  , Lentigo/lentigines: Scattered pigmented macules that are tan to brown in color and are somewhat non-uniform in shape and concentrated in the sun-exposed areas, Nevus/nevi: Scattered well-demarcated, regular, pigmented macule(s) and/or papule(s)  , Actinic Damage/Elastosis: chronic sun damage: dyspigmentation, telangiectasia, and wrinkling  Examination limited by: Undergarments and Patient deferred removal       Assessment & Plan   SKIN CANCER SCREENING PERFORMED TODAY.  BENIGN SKIN FINDINGS  - Lentigines  - Hemangiomas   - Nevus/Multiple Benign Nevi  - Reassurance provided regarding the benign appearance of lesions noted on exam today; no treatment is indicated in the absence of symptoms/changes. - Reinforced importance of photoprotective strategies including liberal and frequent  sunscreen use of a broad-spectrum SPF 30 or greater, use of protective clothing, and sun avoidance for prevention of cutaneous malignancy and photoaging.  Counseled patient on the importance of regular self-skin monitoring as well as routine clinical skin examinations as scheduled.   ACTINIC DAMAGE - Chronic condition, secondary to cumulative UV/sun exposure - Recommend daily broad spectrum sunscreen SPF 30+ to sun-exposed areas, reapply every 2 hours as needed.  - Staying in the shade or wearing long sleeves, sun glasses (UVA+UVB protection) and wide brim hats (4-inch brim around the entire circumference of the hat) are also recommended for sun protection.  - Call for new or changing lesions.  Level of service outlined above   Procedures, orders, diagnosis for this visit:    There are no diagnoses linked to this encounter.  Return to clinic: Return in about 1 year (around 10/24/2024) for TBSE.  I, Emerick Ege, CMA am acting as scribe for Lauraine JAYSON Kanaris, MD   Documentation: I have reviewed the above documentation for accuracy and completeness, and I agree with the above.  Lauraine JAYSON Kanaris, MD

## 2023-11-08 ENCOUNTER — Other Ambulatory Visit: Payer: Self-pay

## 2023-11-09 ENCOUNTER — Other Ambulatory Visit: Payer: Self-pay

## 2023-11-12 ENCOUNTER — Other Ambulatory Visit: Payer: Self-pay

## 2023-11-12 ENCOUNTER — Ambulatory Visit: Attending: Cardiology | Admitting: Cardiology

## 2023-11-12 MED ORDER — PYRIDOSTIGMINE BROMIDE 60 MG PO TABS
60.0000 mg | ORAL_TABLET | Freq: Three times a day (TID) | ORAL | 11 refills | Status: DC
  Filled 2023-11-12: qty 90, 30d supply, fill #0

## 2023-11-12 NOTE — Progress Notes (Deleted)
 Cardiology Office Note   Date:  11/12/2023  ID:  Monica, Kaiser 09/03/1975, MRN [id number] PCP: Monica Shad, MD  [facility] Providers Cardiologist:  Deatrice Cage, MD { Click to update primary MD,subspecialty MD or APP then REFRESH:1}    History of Present Illness Monica Kaiser is a 48 y.o. female with past medical history of tachypalpitations, COVID infection (12/2018), migraines, anxiety, who presents today for follow-up.   She had previously been evaluated in 2020 for tachypalpitations.  Heart monitor revealed sinus rhythm with average rate of 86 bpm, isolated PACs PVCs with less than 1% burden.  She was seen evaluated in clinic November 2021 reporting increase in tachypalpitations with chest discomfort.  She stated initial Zio patch was worn while she had COVID.  A repeat monitor was ordered.  Her monitor showed normal sinus rhythm with an average heart rate of 80 bpm, rare PACs, rare PVCs, triggered events did not correlate with an arrhythmia.  She had been previously seen in clinic 07/2022 reporting overall she was doing well.  She had noted her Apple Watch showed a heart rate approximately 190 bpm.  At that time she was going to dinner with a friend.  Heart rates Going up to the 160s.  She was to have a repeat heart monitor for an additional 2 weeks.   She was last seen in clinic 07/20/2023 doing well with a cardiac perspective.  She and her Apple Watch had noted elevated heart rates with a burden of approximately 192 bpm and lowest was 44 bpm.  She states over the last several weeks she had had tightening squeezing type pressure in her left chest she had primarily noted in the evening when she was really hunched in particular while she was exerting herself.  She was sent for labs with a TSH, free T4, magnesium , BMP, and a D-dimer.  She was also scheduled for coronary calcium scoring.  She returns clinic today  ROS: 10 point review of systems has been reviewed and  considered negative the exception was been listed in the HPI  Studies Reviewed     Calcium Scoring 08/24/2023 IMPRESSION AND RECOMMENDATION: 1. Coronary calcium score of 0.   CAC 0, CAC-DRS A0.   Continue heart healthy lifestyle and risk factor modification.  Heart Monitor 09/01/2022 Patient had a min HR of 36 bpm, max HR of 193 bpm, and avg HR of 81 bpm. Predominant underlying rhythm was Sinus Rhythm. Second Degree AV Block-Mobitz I (Wenckebach) was present during at 1:36 AM presumably during sleep.   Rare PACs and rare PVCs.   2D echo 08/10/2022 1. Left ventricular ejection fraction, by estimation, is 55 to 60%. Left  ventricular ejection fraction by 3D volume is 56 %. Left ventricular  ejection fraction by PLAX is 56 %. The left ventricle has normal function.  The left ventricle has no regional  wall motion abnormalities. Left ventricular diastolic parameters were  normal.   2. Right ventricular systolic function is normal. The right ventricular  size is normal.   3. The mitral valve is normal in structure. Mild mitral valve  regurgitation.   4. The aortic valve is tricuspid. Aortic valve regurgitation is not  visualized.   5. The inferior vena cava is normal in size with greater than 50%  respiratory variability, suggesting right atrial pressure of 3 mmHg.    Heart monitor 12/2019  Normal sinus rhythm with an average heart rate of 88 bpm. Rare PACs and rare PVCs. Triggered events  did not correlate with arrhythmia. No significant arrhythmia.   Heart monitor 01/2019`  Normal sinus rhythm with an average heart rate of 86 bpm. Isolated PACs and PVCs with less than 1% burden. Most triggered events correlated with sinus tachycardia. Highest sinus tachycardia rate was around 200 bpm. Risk Assessment/Calculations {Does this patient have ATRIAL FIBRILLATION?:[phone]} No BP recorded.  {Refresh Note OR Click here to enter BP  :1}***       Physical Exam VS:  There were no vitals  taken for this visit.       Wt Readings from Last 3 Encounters:  08/13/23 132 lb 12.8 oz (60.2 kg)  07/20/23 140 lb 9.6 oz (63.8 kg)  02/01/23 136 lb 12.8 oz (62.1 kg)    GEN: Well nourished, well developed in no acute distress NECK: No JVD; No carotid bruits CARDIAC: ***RRR, no murmurs, rubs, gallops RESPIRATORY:  Clear to auscultation without rales, wheezing or rhonchi  ABDOMEN: Soft, non-tender, non-distended EXTREMITIES:  No edema; No deformity   ASSESSMENT AND PLAN Palpitations with a history of tach palpitations dating back to 2020 Atypical chest pain    {Are you ordering a CV Procedure (e.g. stress test, cath, DCCV, TEE, etc)?   Press F2        :[id number]}  Dispo: ***  Signed, Nikolaus Pienta, NP

## 2023-11-15 ENCOUNTER — Encounter: Payer: Self-pay | Admitting: Cardiology

## 2023-11-22 ENCOUNTER — Other Ambulatory Visit: Payer: Self-pay

## 2023-12-13 NOTE — Progress Notes (Unsigned)
 Referring Physician:  Glendia Shad, MD [address] [place],  [place] [address]  Primary Physician:  Glendia Shad, MD  History of Present Illness: 12/13/2023 [name] is here today with a chief complaint of ***  Low back pain. Any tingling or numbness? Any weakness?   Duration: *** Location: *** Quality: *** Severity: ***  Precipitating: aggravated by *** Modifying factors: made better by *** Weakness: none Timing: *** Bowel/Bladder Dysfunction: none  Conservative measures:  Physical therapy: *** Has not participated in? Multimodal medical therapy including regular antiinflammatories: *** ? Injections: *** No epidural steroid injections?  Past Surgery: ***  Monica Kaiser has ***no symptoms of cervical myelopathy.  The symptoms are causing a significant impact on the patient's life.   Review of Systems:  A 10 point review of systems is negative, except for the pertinent positives and negatives detailed in the HPI.  Past Medical History: Past Medical History:  Diagnosis Date   Anxiety    Fatigue    Low back pain    Migraine    Miscarriage     Past Surgical History: Past Surgical History:  Procedure Laterality Date   BREAST CYST EXCISION Right 2003   benign    COLONOSCOPY  2004    Allergies: Allergies as of 12/16/2023   (No Known Allergies)    Medications: Outpatient Encounter Medications as of 12/16/2023  Medication Sig   Ascorbic Acid (VITAMIN C PO) Take by mouth. (Patient taking differently: Take 1 capsule by mouth daily.)   MAGNESIUM  GLYCINATE PO Take 2 tablets by mouth at bedtime.   norgestimate -ethinyl estradiol  (VYLIBRA ) 0.25-35 MG-MCG tablet Take 1 tablet by mouth daily.   ondansetron  (ZOFRAN -ODT) 4 MG disintegrating tablet Take 1 tablet (4 mg total) by mouth every 8 (eight) hours as needed for nausea.   Plecanatide  (TRULANCE ) 3 MG TABS Take 1 tablet (3 mg total) by mouth daily.   pyridostigmine   (MESTINON ) 60 MG tablet Take 1 tablet (60 mg total) by mouth 3 (three) times daily.   Riboflavin (B2 PO) Take by mouth.   Rimegepant Sulfate  (NURTEC) 75 MG TBDP Take 1 tablet (75 mg total) by mouth daily as needed for rescue treatment of migraine.   Turmeric (QC TUMERIC COMPLEX) 500 MG CAPS Take 500 mg by mouth daily.   VITAMIN D -VITAMIN K PO Take 1 capsule by mouth daily at 6 (six) AM.   No facility-administered encounter medications on file as of 12/16/2023.    Social History: Social History   Tobacco Use   Smoking status: Never   Smokeless tobacco: Never  Vaping Use   Vaping status: Never Used  Substance Use Topics   Alcohol use: No   Drug use: No    Family Medical History: Family History  Problem Relation Age of Onset   Lung cancer Father 73   Colon cancer Father 74   Cancer Father        lung and colon cancer age 84   Arthritis Mother    Diabetes Mother    Hyperlipidemia Mother    Hypertension Mother    Breast cancer Neg Hx     Physical Examination: @VITALWITHPAIN @  General: Patient is well developed, well nourished, calm, collected, and in no apparent distress. Attention to examination is appropriate.  Psychiatric: Patient is non-anxious.  Head:  Pupils equal, round, and reactive to light.  ENT:  Oral mucosa appears well hydrated.  Neck:   Supple.  ***Full range of motion.  Respiratory: Patient is breathing without  any difficulty.  Extremities: No edema.  Vascular: Palpable dorsal pedal pulses.  Skin:   On exposed skin, there are no abnormal skin lesions.  NEUROLOGICAL:     Awake, alert, oriented to person, place, and time.  Speech is clear and fluent. Fund of knowledge is appropriate.   Cranial Nerves: Pupils equal round and reactive to light.  Facial tone is symmetric.  Facial sensation is symmetric.  ROM of spine: ***full.  Palpation of spine: ***non tender.    Strength: Side Biceps Triceps Deltoid Interossei Grip Wrist Ext. Wrist Flex.  R 5  5 5 5 5 5 5   L 5 5 5 5 5 5 5    Side Iliopsoas Quads Hamstring PF DF EHL  R 5 5 5 5 5 5   L 5 5 5 5 5 5    Reflexes are ***2+ and symmetric at the biceps, triceps, brachioradialis, patella and achilles.   Hoffman's is absent.  Clonus is not present.  Toes are down-going.  Bilateral upper and lower extremity sensation is intact to light touch.    Gait is normal.   No difficulty with tandem gait.   No evidence of dysmetria noted.  Medical Decision Making  Imaging: ***  I have personally reviewed the images and agree with the above interpretation.  Assessment and Plan: [name] is a pleasant 48 y.o. female with ***    Thank you for involving me in the care of this patient.   I spent a total of *** minutes in both face-to-face and non-face-to-face activities for this visit on the date of this encounter.   [facility]

## 2023-12-16 ENCOUNTER — Encounter: Payer: Self-pay | Admitting: Neurosurgery

## 2023-12-16 ENCOUNTER — Ambulatory Visit

## 2023-12-16 ENCOUNTER — Ambulatory Visit: Admitting: Neurosurgery

## 2023-12-16 ENCOUNTER — Other Ambulatory Visit: Payer: Self-pay

## 2023-12-16 VITALS — BP 110/80 | Ht 66.0 in | Wt 135.8 lb

## 2023-12-16 DIAGNOSIS — M5136 Other intervertebral disc degeneration, lumbar region with discogenic back pain only: Secondary | ICD-10-CM | POA: Diagnosis not present

## 2023-12-16 DIAGNOSIS — M545 Low back pain, unspecified: Secondary | ICD-10-CM | POA: Diagnosis not present

## 2023-12-16 MED FILL — Tizanidine HCl Tab 4 MG (Base Equivalent): 4.0000 mg | ORAL | 30 days supply | Qty: 90 | Fill #0 | Status: AC

## 2023-12-23 ENCOUNTER — Ambulatory Visit
Admission: RE | Admit: 2023-12-23 | Discharge: 2023-12-23 | Disposition: A | Source: Ambulatory Visit | Attending: Neurosurgery

## 2023-12-23 DIAGNOSIS — M5136 Other intervertebral disc degeneration, lumbar region with discogenic back pain only: Secondary | ICD-10-CM | POA: Insufficient documentation

## 2023-12-23 DIAGNOSIS — M48061 Spinal stenosis, lumbar region without neurogenic claudication: Secondary | ICD-10-CM | POA: Diagnosis not present

## 2023-12-23 DIAGNOSIS — M5126 Other intervertebral disc displacement, lumbar region: Secondary | ICD-10-CM | POA: Diagnosis not present

## 2023-12-23 DIAGNOSIS — D1809 Hemangioma of other sites: Secondary | ICD-10-CM | POA: Diagnosis not present

## 2023-12-31 ENCOUNTER — Ambulatory Visit: Payer: Self-pay

## 2023-12-31 NOTE — Telephone Encounter (Signed)
 FYI Only or Action Required?: Action required by provider: request for appointment and update on patient condition.  Patient was last seen in primary care on 08/13/2023 by Glendia Shad, MD.  Called Nurse Triage reporting Back Pain.  Symptoms began yesterday.  Interventions attempted: OTC medications: Tylenol and Prescription medications: Voltaren  and Zanaflex .  Symptoms are: gradually worsening.  Triage Disposition: See PCP When Office is Open (Within 3 Days)  Patient/caregiver understands and will follow disposition?: Yes  Copied from CRM #[id number]. Topic: Clinical - Red Word Triage >> Dec 31, 2023  8:43 AM Victoria A wrote: [facility] that prompted transfer to Nurse Triage: Patient has radiating back pain that is a 10 that is going to her lower left abdomen . Reason for Disposition  [1] MODERATE back pain (e.g., interferes with normal activities) AND [2] present > 3 days  Answer Assessment - Initial Assessment Questions 1. ONSET: When did the pain begin? (e.g., minutes, hours, days)      Yesterday  2. LOCATION: Where does it hurt? (upper, mid or lower back)      Lower back  3. SEVERITY: How bad is the pain?  (e.g., Scale 1-10; mild, moderate, or severe)      7/10  4. PATTERN: Is the pain constant? (e.g., yes, no; constant, intermittent)         5. RADIATION: Does the pain shoot into your legs or somewhere else?     To abdomen  6. CAUSE:  What do you think is causing the back pain?       Nerve pain   7. BACK OVERUSE:  Any recent lifting of heavy objects, strenuous work or exercise?      Denies  8. MEDICINES: What have you taken so far for the pain? (e.g., nothing, acetaminophen, NSAIDS)      Tylenol  9. NEUROLOGIC SYMPTOMS: Pt denies weakness, numbness, or problems with bowel/bladder control        10. OTHER SYMPTOMS: Pt denies fever, burning with urination, blood in urine           Pt reports back pain, abdominal pain Pt is taking OTC Tylenol,  Zanaflex , and voltaren  Offered an acute visit for today, 12.26.25, to patient, however, pt declined due to wanting to see PCP.  Routing to clinic for assistance scheduling pt for an acute visit. Pt states she has availability to come into clinic at any time today Pt agrees with plan of care, will call back for any worsening symptoms  Protocols used: Back Pain-A-AH

## 2024-01-03 NOTE — Telephone Encounter (Signed)
 Reviewed her message. Called in with worsening back pain. She is seeing neurosurgery and they have recently evaluated and ordered f/u xrays and MRI. Please call and confirm doing ok.  Also, see if she followed up with neurosurgery (or if she was seen). Let her know that I am out of the office this week and do agree with need for f/u and evaluation if increasing pain.

## 2024-01-05 ENCOUNTER — Other Ambulatory Visit: Payer: Self-pay | Admitting: Neurosurgery

## 2024-01-05 ENCOUNTER — Ambulatory Visit
Admission: RE | Admit: 2024-01-05 | Discharge: 2024-01-05 | Disposition: A | Discharge status: Home / Self Care | Source: Ambulatory Visit | Attending: Neurosurgery | Admitting: Neurosurgery

## 2024-01-05 DIAGNOSIS — R109 Unspecified abdominal pain: Secondary | ICD-10-CM

## 2024-01-05 DIAGNOSIS — S30851A Superficial foreign body of abdominal wall, initial encounter: Secondary | ICD-10-CM

## 2024-01-05 MED ORDER — IOHEXOL 300 MG/ML  SOLN
85.0000 mL | Freq: Once | INTRAMUSCULAR | Status: AC | PRN
  Administered 2024-01-05: 85 mL via INTRAVENOUS

## 2024-01-05 NOTE — Addendum Note (Signed)
 Addended by: Kavi Almquist W on: 01/05/2024 09:59 AM   Modules accepted: Orders

## 2024-01-05 NOTE — Telephone Encounter (Signed)
 Monica Kaiser spoke with patient and working on getting patient set up for CT scan of abdomen

## 2024-01-05 NOTE — Addendum Note (Signed)
 Addended by: Lolly Glaus W on: 01/05/2024 10:01 AM   Modules accepted: Orders

## 2024-01-27 ENCOUNTER — Other Ambulatory Visit: Payer: Self-pay

## 2024-02-03 ENCOUNTER — Ambulatory Visit: Admitting: Neurosurgery

## 2024-02-03 ENCOUNTER — Other Ambulatory Visit: Payer: Self-pay

## 2024-02-11 ENCOUNTER — Other Ambulatory Visit: Payer: Self-pay | Admitting: Neurosurgery

## 2024-02-11 ENCOUNTER — Telehealth: Payer: Self-pay | Admitting: Family Medicine

## 2024-02-11 DIAGNOSIS — M5136 Other intervertebral disc degeneration, lumbar region with discogenic back pain only: Secondary | ICD-10-CM

## 2024-02-11 NOTE — Addendum Note (Signed)
 Addended by: GREGORY EDSEL CROME on: 02/11/2024 02:52 PM   Modules accepted: Orders

## 2024-02-11 NOTE — Telephone Encounter (Signed)
 Arland from [facility] called and thinks this order may be wrong. She said they do not do this type of order.  Bone with [facility] CPT [id number] with spec CT code 21169 if you want her to fix this order she is happy to. Otherwise they do not do this there. She thinks this could possibly be done a [facility]. If you are wanting this particular order we can find out where it can be done.   Burnard is her supervisor Phone number is [phone]

## 2024-02-14 ENCOUNTER — Ambulatory Visit: Admitting: Internal Medicine

## 2024-02-14 DIAGNOSIS — R002 Palpitations: Secondary | ICD-10-CM

## 2024-10-30 ENCOUNTER — Encounter
